# Patient Record
Sex: Male | Born: 1935 | Race: White | Hispanic: No | Marital: Married | Smoking: Former smoker
Health system: Southern US, Community
[De-identification: ages and names within clinical notes are randomized; demographics above are authoritative.]

## PROBLEM LIST (undated history)

## (undated) DIAGNOSIS — J449 Chronic obstructive pulmonary disease, unspecified: Secondary | ICD-10-CM

## (undated) DIAGNOSIS — I4891 Unspecified atrial fibrillation: Secondary | ICD-10-CM

## (undated) DIAGNOSIS — C801 Malignant (primary) neoplasm, unspecified: Secondary | ICD-10-CM

## (undated) DIAGNOSIS — K259 Gastric ulcer, unspecified as acute or chronic, without hemorrhage or perforation: Secondary | ICD-10-CM

## (undated) DIAGNOSIS — E785 Hyperlipidemia, unspecified: Secondary | ICD-10-CM

## (undated) HISTORY — PX: REPLACEMENT TOTAL KNEE: SUR1224

---

## 2004-04-20 ENCOUNTER — Inpatient Hospital Stay: Payer: Self-pay | Admitting: General Practice

## 2004-04-21 ENCOUNTER — Other Ambulatory Visit: Payer: Self-pay

## 2004-04-22 ENCOUNTER — Other Ambulatory Visit: Payer: Self-pay

## 2004-04-23 ENCOUNTER — Other Ambulatory Visit: Payer: Self-pay

## 2004-04-26 ENCOUNTER — Ambulatory Visit: Payer: Self-pay | Admitting: Family Medicine

## 2004-06-19 ENCOUNTER — Ambulatory Visit: Payer: Self-pay | Admitting: Family Medicine

## 2004-07-12 ENCOUNTER — Ambulatory Visit: Payer: Self-pay | Admitting: Family Medicine

## 2005-09-19 ENCOUNTER — Ambulatory Visit: Payer: Self-pay | Admitting: Urology

## 2005-10-09 ENCOUNTER — Ambulatory Visit: Payer: Self-pay | Admitting: Urology

## 2006-03-20 ENCOUNTER — Ambulatory Visit: Payer: Self-pay | Admitting: Urology

## 2007-05-14 ENCOUNTER — Ambulatory Visit: Payer: Self-pay | Admitting: Ophthalmology

## 2007-05-19 ENCOUNTER — Ambulatory Visit: Payer: Self-pay | Admitting: Ophthalmology

## 2007-07-21 ENCOUNTER — Other Ambulatory Visit: Payer: Self-pay

## 2007-07-21 ENCOUNTER — Ambulatory Visit: Payer: Self-pay | Admitting: Ophthalmology

## 2013-04-19 ENCOUNTER — Emergency Department: Payer: Self-pay

## 2013-04-19 LAB — TROPONIN I: Troponin-I: <0.02 ng/mL

## 2013-04-19 LAB — BASIC METABOLIC PANEL
ANION GAP: 9 (ref 7–16)
BUN: 24 mg/dL — ABNORMAL HIGH (ref 7–18)
CALCIUM: 8.7 mg/dL (ref 8.5–10.1)
CREATININE: 1.4 mg/dL — AB (ref 0.60–1.30)
Chloride: 106 mmol/L (ref 98–107)
Co2: 24 mmol/L (ref 21–32)
EGFR (African American): 54 — ABNORMAL LOW
GFR CALC NON AF AMER: 47 — AB
Glucose: 88 mg/dL (ref 65–99)
Osmolality: 281 (ref 275–301)
POTASSIUM: 4 mmol/L (ref 3.5–5.1)
Sodium: 139 mmol/L (ref 136–145)

## 2013-04-19 LAB — CBC
HCT: 46.8 % (ref 40.0–52.0)
HGB: 15.8 g/dL (ref 13.0–18.0)
MCH: 35.8 pg — ABNORMAL HIGH (ref 26.0–34.0)
MCHC: 33.8 g/dL (ref 32.0–36.0)
MCV: 106 fL — ABNORMAL HIGH (ref 80–100)
Platelet: 164 10*3/uL (ref 150–440)
RBC: 4.42 10*6/uL (ref 4.40–5.90)
RDW: 13.5 % (ref 11.5–14.5)
WBC: 9.6 10*3/uL (ref 3.8–10.6)

## 2013-04-19 LAB — HEPATIC FUNCTION PANEL A (ARMC)
Albumin: 3.6 g/dL (ref 3.4–5.0)
Alkaline Phosphatase: 66 U/L
BILIRUBIN DIRECT: 0.2 mg/dL (ref 0.00–0.20)
Bilirubin,Total: 0.9 mg/dL (ref 0.2–1.0)
SGOT(AST): 30 U/L (ref 15–37)
SGPT (ALT): 22 U/L (ref 12–78)
Total Protein: 7.2 g/dL (ref 6.4–8.2)

## 2013-04-19 LAB — LIPASE, BLOOD: LIPASE: 184 U/L (ref 73–393)

## 2013-04-19 LAB — PROTIME-INR
INR: 2.1
Prothrombin Time: 23.4 secs — ABNORMAL HIGH (ref 11.5–14.7)

## 2015-05-01 ENCOUNTER — Encounter: Payer: Self-pay | Admitting: *Deleted

## 2015-05-01 ENCOUNTER — Inpatient Hospital Stay
Admission: EM | Admit: 2015-05-01 | Discharge: 2015-05-06 | DRG: 872 | Disposition: A | Discharge status: Skilled Nursing Facility | Payer: Medicare Other | Attending: Internal Medicine | Admitting: Internal Medicine

## 2015-05-01 ENCOUNTER — Emergency Department: Payer: Medicare Other

## 2015-05-01 DIAGNOSIS — L03119 Cellulitis of unspecified part of limb: Secondary | ICD-10-CM

## 2015-05-01 DIAGNOSIS — Z79899 Other long term (current) drug therapy: Secondary | ICD-10-CM | POA: Diagnosis not present

## 2015-05-01 DIAGNOSIS — L02419 Cutaneous abscess of limb, unspecified: Secondary | ICD-10-CM | POA: Diagnosis present

## 2015-05-01 DIAGNOSIS — E872 Acidosis: Secondary | ICD-10-CM | POA: Diagnosis present

## 2015-05-01 DIAGNOSIS — C7951 Secondary malignant neoplasm of bone: Secondary | ICD-10-CM | POA: Diagnosis present

## 2015-05-01 DIAGNOSIS — Z96651 Presence of right artificial knee joint: Secondary | ICD-10-CM | POA: Diagnosis present

## 2015-05-01 DIAGNOSIS — I10 Essential (primary) hypertension: Secondary | ICD-10-CM | POA: Diagnosis present

## 2015-05-01 DIAGNOSIS — K567 Ileus, unspecified: Secondary | ICD-10-CM | POA: Diagnosis present

## 2015-05-01 DIAGNOSIS — I482 Chronic atrial fibrillation: Secondary | ICD-10-CM | POA: Diagnosis present

## 2015-05-01 DIAGNOSIS — K5900 Constipation, unspecified: Secondary | ICD-10-CM | POA: Diagnosis present

## 2015-05-01 DIAGNOSIS — R627 Adult failure to thrive: Secondary | ICD-10-CM | POA: Diagnosis present

## 2015-05-01 DIAGNOSIS — I4891 Unspecified atrial fibrillation: Secondary | ICD-10-CM

## 2015-05-01 DIAGNOSIS — Z87891 Personal history of nicotine dependence: Secondary | ICD-10-CM

## 2015-05-01 DIAGNOSIS — K760 Fatty (change of) liver, not elsewhere classified: Secondary | ICD-10-CM | POA: Diagnosis present

## 2015-05-01 DIAGNOSIS — Z6828 Body mass index (BMI) 28.0-28.9, adult: Secondary | ICD-10-CM | POA: Diagnosis not present

## 2015-05-01 DIAGNOSIS — K5981 Ogilvie syndrome: Secondary | ICD-10-CM

## 2015-05-01 DIAGNOSIS — D701 Agranulocytosis secondary to cancer chemotherapy: Secondary | ICD-10-CM | POA: Diagnosis present

## 2015-05-01 DIAGNOSIS — T451X5A Adverse effect of antineoplastic and immunosuppressive drugs, initial encounter: Secondary | ICD-10-CM | POA: Diagnosis present

## 2015-05-01 DIAGNOSIS — K598 Other specified functional intestinal disorders: Secondary | ICD-10-CM

## 2015-05-01 DIAGNOSIS — Z91040 Latex allergy status: Secondary | ICD-10-CM

## 2015-05-01 DIAGNOSIS — L03116 Cellulitis of left lower limb: Secondary | ICD-10-CM | POA: Diagnosis present

## 2015-05-01 DIAGNOSIS — R17 Unspecified jaundice: Secondary | ICD-10-CM

## 2015-05-01 DIAGNOSIS — C61 Malignant neoplasm of prostate: Secondary | ICD-10-CM | POA: Diagnosis present

## 2015-05-01 DIAGNOSIS — Z452 Encounter for adjustment and management of vascular access device: Secondary | ICD-10-CM

## 2015-05-01 DIAGNOSIS — Z7901 Long term (current) use of anticoagulants: Secondary | ICD-10-CM | POA: Diagnosis not present

## 2015-05-01 DIAGNOSIS — E785 Hyperlipidemia, unspecified: Secondary | ICD-10-CM | POA: Diagnosis present

## 2015-05-01 DIAGNOSIS — A419 Sepsis, unspecified organism: Principal | ICD-10-CM | POA: Diagnosis present

## 2015-05-01 DIAGNOSIS — D61818 Other pancytopenia: Secondary | ICD-10-CM | POA: Diagnosis present

## 2015-05-01 DIAGNOSIS — J449 Chronic obstructive pulmonary disease, unspecified: Secondary | ICD-10-CM | POA: Diagnosis present

## 2015-05-01 HISTORY — DX: Chronic obstructive pulmonary disease, unspecified: J44.9

## 2015-05-01 HISTORY — DX: Unspecified atrial fibrillation: I48.91

## 2015-05-01 HISTORY — DX: Hyperlipidemia, unspecified: E78.5

## 2015-05-01 HISTORY — DX: Malignant (primary) neoplasm, unspecified: C80.1

## 2015-05-01 LAB — MRSA PCR SCREENING: MRSA BY PCR: NEGATIVE

## 2015-05-01 LAB — LACTIC ACID, PLASMA
LACTIC ACID, VENOUS: 2.8 mmol/L — AB (ref 0.5–2.0)
LACTIC ACID, VENOUS: 2.4 mmol/L — AB (ref 0.5–2.0)
LACTIC ACID, VENOUS: 1.5 mmol/L (ref 0.5–2.0)

## 2015-05-01 LAB — COMPREHENSIVE METABOLIC PANEL
ALT: 28 U/L (ref 17–63)
ANION GAP: 10 (ref 5–15)
AST: 36 U/L (ref 15–41)
Albumin: 2.8 g/dL — ABNORMAL LOW (ref 3.5–5.0)
Alkaline Phosphatase: 122 U/L (ref 38–126)
BILIRUBIN TOTAL: 2 mg/dL — AB (ref 0.3–1.2)
BUN: 20 mg/dL (ref 6–20)
CALCIUM: 7.7 mg/dL — AB (ref 8.9–10.3)
CO2: 23 mmol/L (ref 22–32)
CREATININE: 1.12 mg/dL (ref 0.61–1.24)
Chloride: 101 mmol/L (ref 101–111)
GFR calc Af Amer: >60 mL/min (ref >60)
GFR calc non Af Amer: 59 mL/min — ABNORMAL LOW (ref >60)
GLUCOSE: 162 mg/dL — AB (ref 65–99)
Potassium: 4 mmol/L (ref 3.5–5.1)
Sodium: 134 mmol/L — ABNORMAL LOW (ref 135–145)
TOTAL PROTEIN: 6.3 g/dL — AB (ref 6.5–8.1)

## 2015-05-01 LAB — CBC WITH DIFFERENTIAL/PLATELET
BAND NEUTROPHILS: 10 %
BASOS ABS: 0 10*3/uL (ref 0–0.1)
BLASTS: 0 %
Basophils Relative: 0 %
EOS ABS: 0 10*3/uL (ref 0–0.7)
Eosinophils Relative: 1 %
HEMATOCRIT: 39.5 % — AB (ref 40.0–52.0)
Hemoglobin: 13.5 g/dL (ref 13.0–18.0)
LYMPHS PCT: 15 %
Lymphs Abs: 0.2 10*3/uL — ABNORMAL LOW (ref 1.0–3.6)
MCH: 37.1 pg — AB (ref 26.0–34.0)
MCHC: 34.1 g/dL (ref 32.0–36.0)
MCV: 108.8 fL — AB (ref 80.0–100.0)
Metamyelocytes Relative: 2 %
Monocytes Absolute: 0.3 10*3/uL (ref 0.2–1.0)
Monocytes Relative: 24 %
Myelocytes: 1 %
Neutro Abs: 0.8 10*3/uL — ABNORMAL LOW (ref 1.4–6.5)
Neutrophils Relative %: 47 %
OTHER: 0 %
PROMYELOCYTES ABS: 0 %
Platelets: 116 10*3/uL — ABNORMAL LOW (ref 150–440)
RBC: 3.63 MIL/uL — AB (ref 4.40–5.90)
RDW: 14 % (ref 11.5–14.5)
SMEAR REVIEW: ADEQUATE
WBC: 1.3 10*3/uL — AB (ref 3.8–10.6)
nRBC: 15 /100 WBC — ABNORMAL HIGH

## 2015-05-01 LAB — APTT: APTT: 49 s — AB (ref 24–36)

## 2015-05-01 LAB — URINALYSIS COMPLETE WITH MICROSCOPIC (ARMC ONLY)
BACTERIA UA: NONE SEEN
Bilirubin Urine: NEGATIVE
GLUCOSE, UA: 50 mg/dL — AB
Ketones, ur: NEGATIVE mg/dL
Leukocytes, UA: NEGATIVE
Nitrite: NEGATIVE
Protein, ur: 100 mg/dL — AB
Specific Gravity, Urine: 1.023 (ref 1.005–1.030)
pH: 6 (ref 5.0–8.0)

## 2015-05-01 LAB — MAGNESIUM: Magnesium: 1.8 mg/dL (ref 1.7–2.4)

## 2015-05-01 LAB — GLUCOSE, CAPILLARY: GLUCOSE-CAPILLARY: 116 mg/dL — AB (ref 65–99)

## 2015-05-01 LAB — RAPID INFLUENZA A&B ANTIGENS
Influenza A (ARMC): NOT DETECTED
Influenza B (ARMC): NOT DETECTED

## 2015-05-01 LAB — TROPONIN I: Troponin I: 0.03 ng/mL (ref <0.031)

## 2015-05-01 LAB — PROTIME-INR
INR: 1.62
PROTHROMBIN TIME: 19.3 s — AB (ref 11.4–15.0)

## 2015-05-01 MED ORDER — ONDANSETRON HCL 4 MG/2ML IJ SOLN
4.0000 mg | Freq: Four times a day (QID) | INTRAMUSCULAR | Status: DC | PRN
  Administered 2015-05-01: 4 mg via INTRAVENOUS
  Filled 2015-05-01: qty 2

## 2015-05-01 MED ORDER — HEPARIN (PORCINE) IN NACL 100-0.45 UNIT/ML-% IJ SOLN
1450.0000 [IU]/h | INTRAMUSCULAR | Status: DC
  Administered 2015-05-01: 1450 [IU]/h via INTRAVENOUS
  Filled 2015-05-01 (×6): qty 250

## 2015-05-01 MED ORDER — VANCOMYCIN HCL IN DEXTROSE 1-5 GM/200ML-% IV SOLN
1000.0000 mg | Freq: Two times a day (BID) | INTRAVENOUS | Status: DC
  Administered 2015-05-02 – 2015-05-03 (×4): 1000 mg via INTRAVENOUS
  Filled 2015-05-01 (×6): qty 200

## 2015-05-01 MED ORDER — PIPERACILLIN-TAZOBACTAM 3.375 G IVPB 30 MIN: 3.3750 g | Freq: Once | INTRAVENOUS | Status: DC

## 2015-05-01 MED ORDER — DILTIAZEM HCL ER COATED BEADS 180 MG PO CP24
360.0000 mg | ORAL_CAPSULE | ORAL | Status: DC
  Administered 2015-05-02 – 2015-05-06 (×5): 360 mg via ORAL
  Filled 2015-05-01 (×5): qty 2

## 2015-05-01 MED ORDER — DEXTROSE 5 % IV SOLN
2.0000 g | Freq: Once | INTRAVENOUS | Status: AC
  Administered 2015-05-01: 2 g via INTRAVENOUS
  Filled 2015-05-01: qty 2

## 2015-05-01 MED ORDER — DEXTROSE 5 % IV SOLN
1.0000 g | Freq: Once | INTRAVENOUS | Status: DC
  Filled 2015-05-01: qty 1

## 2015-05-01 MED ORDER — ALBUTEROL SULFATE (2.5 MG/3ML) 0.083% IN NEBU
2.5000 mg | INHALATION_SOLUTION | RESPIRATORY_TRACT | Status: DC | PRN
  Administered 2015-05-04 – 2015-05-05 (×3): 2.5 mg via RESPIRATORY_TRACT
  Filled 2015-05-01 (×4): qty 3

## 2015-05-01 MED ORDER — DILTIAZEM HCL 25 MG/5ML IV SOLN
15.0000 mg | Freq: Once | INTRAVENOUS | Status: AC
  Administered 2015-05-01: 15 mg via INTRAVENOUS
  Filled 2015-05-01: qty 5

## 2015-05-01 MED ORDER — WARFARIN SODIUM 1 MG PO TABS
4.0000 mg | ORAL_TABLET | Freq: Once | ORAL | Status: AC
  Administered 2015-05-01: 4 mg via ORAL
  Filled 2015-05-01: qty 1
  Filled 2015-05-01: qty 4

## 2015-05-01 MED ORDER — VANCOMYCIN HCL IN DEXTROSE 1-5 GM/200ML-% IV SOLN
1000.0000 mg | Freq: Once | INTRAVENOUS | Status: AC
  Administered 2015-05-01: 1000 mg via INTRAVENOUS
  Filled 2015-05-01: qty 200

## 2015-05-01 MED ORDER — ACETAMINOPHEN 650 MG RE SUPP: 650.0000 mg | Freq: Four times a day (QID) | RECTAL | Status: DC | PRN

## 2015-05-01 MED ORDER — HYDROCODONE-ACETAMINOPHEN 5-325 MG PO TABS
1.0000 | ORAL_TABLET | ORAL | Status: DC | PRN
  Administered 2015-05-01 – 2015-05-04 (×4): 1 via ORAL
  Filled 2015-05-01 (×5): qty 1

## 2015-05-01 MED ORDER — SODIUM CHLORIDE 0.9 % IV BOLUS (SEPSIS)
2940.0000 mL | Freq: Once | INTRAVENOUS | Status: AC
  Administered 2015-05-01: 1000 mL via INTRAVENOUS

## 2015-05-01 MED ORDER — PIPERACILLIN-TAZOBACTAM 3.375 G IVPB
3.3750 g | Freq: Three times a day (TID) | INTRAVENOUS | Status: DC
  Administered 2015-05-01 – 2015-05-05 (×12): 3.375 g via INTRAVENOUS
  Filled 2015-05-01 (×14): qty 50

## 2015-05-01 MED ORDER — DEXAMETHASONE 4 MG PO TABS: 8.0000 mg | ORAL_TABLET | ORAL | Status: DC

## 2015-05-01 MED ORDER — DILTIAZEM HCL 100 MG IV SOLR
5.0000 mg/h | INTRAVENOUS | Status: DC
  Administered 2015-05-01: 5 mg/h via INTRAVENOUS
  Administered 2015-05-02: 7.5 mg/h via INTRAVENOUS
  Filled 2015-05-01 (×2): qty 100

## 2015-05-01 MED ORDER — ACETAMINOPHEN 325 MG PO TABS: 650.0000 mg | ORAL_TABLET | Freq: Four times a day (QID) | ORAL | Status: DC | PRN

## 2015-05-01 MED ORDER — ONDANSETRON HCL 4 MG PO TABS
4.0000 mg | ORAL_TABLET | Freq: Four times a day (QID) | ORAL | Status: DC | PRN
Start: 2015-05-01 — End: 2015-05-06
  Administered 2015-05-03: 4 mg via ORAL
  Filled 2015-05-01: qty 1

## 2015-05-01 MED ORDER — SODIUM CHLORIDE 0.9 % IV SOLN
INTRAVENOUS | Status: DC
  Administered 2015-05-01 – 2015-05-06 (×6): via INTRAVENOUS

## 2015-05-01 MED ORDER — WARFARIN - PHARMACIST DOSING INPATIENT
Freq: Every day | Status: DC
  Administered 2015-05-05: 18:00:00
  Filled 2015-05-01 (×3): qty 1

## 2015-05-01 MED ORDER — DILTIAZEM HCL 25 MG/5ML IV SOLN
10.0000 mg | Freq: Once | INTRAVENOUS | Status: AC
  Administered 2015-05-01: 10 mg via INTRAVENOUS

## 2015-05-01 MED ORDER — PREDNISONE 10 MG PO TABS
10.0000 mg | ORAL_TABLET | Freq: Every day | ORAL | Status: DC
  Administered 2015-05-02 – 2015-05-06 (×5): 10 mg via ORAL
  Filled 2015-05-01 (×5): qty 1

## 2015-05-01 MED ORDER — ATORVASTATIN CALCIUM 20 MG PO TABS
80.0000 mg | ORAL_TABLET | Freq: Every evening | ORAL | Status: DC
  Administered 2015-05-01 – 2015-05-05 (×4): 80 mg via ORAL
  Filled 2015-05-01 (×4): qty 4

## 2015-05-01 MED ORDER — METOPROLOL SUCCINATE ER 100 MG PO TB24
100.0000 mg | ORAL_TABLET | ORAL | Status: DC
  Administered 2015-05-02 – 2015-05-06 (×5): 100 mg via ORAL
  Filled 2015-05-01: qty 2
  Filled 2015-05-01: qty 1
  Filled 2015-05-01 (×2): qty 2
  Filled 2015-05-01: qty 1

## 2015-05-01 MED ORDER — SODIUM CHLORIDE 0.9 % IV BOLUS (SEPSIS): 500.0000 mL | Freq: Once | INTRAVENOUS | Status: DC

## 2015-05-01 MED ORDER — CETYLPYRIDINIUM CHLORIDE 0.05 % MT LIQD
7.0000 mL | Freq: Two times a day (BID) | OROMUCOSAL | Status: DC
  Administered 2015-05-02 – 2015-05-06 (×9): 7 mL via OROMUCOSAL

## 2015-05-01 NOTE — H&P (Addendum)
[place] [facility] [place] NAME: Chad Kirby    MR#:  [id number]  DATE OF BIRTH:  1930-01-25  DATE OF ADMISSION:  05/01/2015  PRIMARY CARE PHYSICIAN: No primary care provider on file.   REQUESTING/REFERRING PHYSICIAN: Joanne Gavel, MD  CHIEF COMPLAINT:   Chief Complaint  Patient presents with  . Weakness   weakness and left leg infection 2-3 days  HISTORY OF PRESENT ILLNESS:  Chad Kirby  is a 82 y.o. male with a known history of this metastasis, chronic A. fib and COPD. The patient at present in the ED revealed a generalized weakness and the left leg infection for the past 2-3 days. Patient has a history of processes cancer with metastasis and got chemotherapy in [place] last Friday. He got left leg injury 1 week ago and noticed left leg infection 3 days ago, which has been spreading and worsening. He also complains of fever and chills, nausea but no vomiting or diarrhea. He was found have A. fib with RVR at 160s in the ED. He was treated with Cardizem IV 1 dose, heart rate decreased to about 130 to 140s. So he  is a started on Cardizem drip and given vancomycin for left leg cellulitis.  PAST MEDICAL HISTORY:   Past Medical History  Diagnosis Date  . COPD (chronic obstructive pulmonary disease) ([place])   . Cancer [facility])     Prostate with bone mets-current chemo treatment  . Hyperlipidemia   . Atrial fibrillation ([place])     PAST SURGICAL HISTORY:   Past Surgical History  Procedure Laterality Date  . Replacement total knee Right     SOCIAL HISTORY:   Social History  Substance Use Topics  . Smoking status: Former Research scientist (life sciences)  . Smokeless tobacco: Not on file  . Alcohol Use: Yes     Comment: occasional    FAMILY HISTORY:  No family history on file.  DRUG ALLERGIES:   Allergies  Allergen Reactions  . Latex Rash    REVIEW OF SYSTEMS:  CONSTITUTIONAL: Has fever, chills and generalized weakness.  EYES: No blurred or double  vision.  EARS, NOSE, AND THROAT: No tinnitus or ear pain.  RESPIRATORY: No cough, shortness of breath, wheezing or hemoptysis.  CARDIOVASCULAR: No chest pain, orthopnea, edema.  GASTROINTESTINAL: Has nausea, no vomiting, diarrhea or abdominal pain.  GENITOURINARY: No dysuria, hematuria.  ENDOCRINE: No polyuria, nocturia,  HEMATOLOGY: No anemia, easy bruising or bleeding SKIN: No rash or lesion. MUSCULOSKELETAL: No joint pain or arthritis. But has left leg injury and infection.  NEUROLOGIC: No tingling, numbness, weakness.  PSYCHIATRY: No anxiety or depression.   MEDICATIONS AT HOME:   Prior to Admission medications   Not on File      VITAL SIGNS:  Blood pressure 116/72, pulse 129, temperature 97.6 F (36.4 C), temperature source Oral, resp. rate 22, height 6' (1.829 m), weight 97.523 kg (215 lb), SpO2 98 %.  PHYSICAL EXAMINATION:  GENERAL:  82 y.o.-year-old patient lying in the bed with no acute distress.  EYES: Pupils equal, round, reactive to light and accommodation. No scleral icterus. Extraocular muscles intact.  HEENT: Head atraumatic, normocephalic. Oropharynx and nasopharynx clear.  NECK:  Supple, no jugular venous distention. No thyroid enlargement, no tenderness.  LUNGS: Normal breath sounds bilaterally, no wheezing, rales,rhonchi or crepitation. No use of accessory muscles of respiration.  CARDIOVASCULAR: S1, S2 normal. No murmurs, rubs, or gallops.  ABDOMEN: Soft, nontender, nondistended. Bowel sounds present. No organomegaly or mass.  EXTREMITIES: No pedal edema, cyanosis, or clubbing. Tenderness, erythema and swelling around the left leg up all ankle and underneath knee. NEUROLOGIC: Cranial nerves II through XII are intact. Muscle strength 4/5 in all extremities. Sensation intact. Gait not checked.  PSYCHIATRIC: The patient is alert and oriented x 3.  SKIN: No obvious rash, lesion, or ulcer.   LABORATORY PANEL:   CBC  Recent Labs Lab 05/01/15 1633  WBC 1.3*   HGB 13.5  HCT 39.5*  PLT 116*   ------------------------------------------------------------------------------------------------------------------  Chemistries   Recent Labs Lab 05/01/15 1633  NA 134*  K 4.0  CL 101  CO2 23  GLUCOSE 162*  BUN 20  CREATININE 1.12  CALCIUM 7.7*  AST 36  ALT 28  ALKPHOS 122  BILITOT 2.0*   ------------------------------------------------------------------------------------------------------------------  Cardiac Enzymes  Recent Labs Lab 05/01/15 1633  TROPONINI 0.03   ------------------------------------------------------------------------------------------------------------------  RADIOLOGY:  Dg Chest Port 1 View  05/01/2015  CLINICAL DATA:  Weakness following chemotherapy EXAM: PORTABLE CHEST 1 VIEW COMPARISON:  04/19/2013 FINDINGS: Cardiac shadow is stable. Elevation of left hemidiaphragm is again seen. No focal infiltrate or sizable effusion is noted. Nodular density is noted overlying the left lung base. No other focal abnormality is seen. IMPRESSION: Nodular density in the left lung base. This may be related to the patient's known clinical history. Nonemergent CT of the chest is recommended to assess. No acute infiltrate is seen. Electronically Signed   By: Inez Catalina M.D.   On: 05/01/2015 17:13    EKG:   Orders placed or performed during the hospital encounter of 05/01/15  . ED EKG  . ED EKG  . EKG 12-Lead  . EKG 12-Lead    IMPRESSION AND PLAN:   A. fib with RVR The patient will be admitted to stepdown, continue Cardizem drip, start heparin drip due to subtherapeutic INR and Coumadin pharmacy to dose. Transition to by mouth Cardizem and Lopressor if heart rate is controlled. Follow-up cardiology consult.  Sepsis with left leg cellulitis.  The patient was treated with vancomycin in the ED. I will continue vancomycin and start Zosyn pharmacy to dose. IV fluid support. Follow-up CBC and blood culture.  Neutropenia. Due to  chemotherapy. Follow-up CBC and oncology consult. Thrombocytopenia. Follow-up CBC. History of processes cancer. Follow-up oncology consult.  Lactic acidosis. Continue IV fluid support and antibiotics follow-up level.  Elevated bilirubin. Follow-up CMP and abdominal ultrasound.  All the records are reviewed and case discussed with ED provider. Management plans discussed with the patient, his wife and daughter and they are in agreement.  CODE STATUS: Full code  TOTAL CRITICAL TIME TAKING CARE OF THIS PATIENT: 62 minutes.    Demetrios Loll M.D on 05/01/2015 at 6:44 PM  Between 7am to 6pm - Pager - [phone]  After 6pm go to [email or web address] - password [email or web address]  [facility]  Office  [phone]  CC: Primary care physician; No primary care provider on file.

## 2015-05-01 NOTE — Consult Note (Signed)
ANTICOAGULATION CONSULT NOTE - Initial Consult  Pharmacy Consult for warfarin Indication: atrial fibrillation  Allergies  Allergen Reactions  . Latex Rash    Patient Measurements: Height: 6' (182.9 cm) Weight: 215 lb (97.523 kg) IBW/kg (Calculated) : 77.6 Heparin Dosing Weight:   Vital Signs: Temp: 97.6 F (36.4 C) (02/26 1603) Temp Source: Oral (02/26 1603) BP: 134/79 mmHg (02/26 1929) Pulse Rate: 140 (02/26 1929)  Labs:  Recent Labs  05/01/15 1633  HGB 13.5  HCT 39.5*  PLT 116*  APTT 49*  LABPROT 19.3*  INR 1.62  CREATININE 1.12  TROPONINI 0.03    Estimated Creatinine Clearance: 60.5 mL/min (by C-G formula based on Cr of 1.12).   Medical History: Past Medical History  Diagnosis Date  . COPD (chronic obstructive pulmonary disease) ([place])   . Cancer [facility])     Prostate with bone mets-current chemo treatment  . Hyperlipidemia   . Atrial fibrillation ([facility])     Medications:  Scheduled:  . warfarin  4 mg Oral ONCE-1800  . [START ON 05/02/2015] Warfarin - Pharmacist Dosing Inpatient   Does not apply q1800    Assessment: Pt is a 82 year old male with a hx of afbi, on warfarin 3mg  daily at home. Pt found to be in afib with RVR. INR is subtherapeutic on admission at 1.62  Goal of Therapy:  INR 2-3 Monitor platelets by anticoagulation protocol: Yes   Plan:  INR on admission is subtherapeutic. Will give 4mg  tonight and recheck INR in the AM. Check INR daily while on antibiotics. Pharmacy also consulted to dose a heparin drip while pt INR is subtherapeutic. Will need to continue unitl INR is therapeutic for atleast 24 hours.  Carollee Massed Maccia 05/01/2015,7:34 PM

## 2015-05-01 NOTE — Consult Note (Signed)
Pharmacy Antibiotic Note  Chad Kirby is a 82 y.o. male admitted on 05/01/2015 with sepsis and wound infection/cellulitis.  Pharmacy has been consulted for vancomycin and zosyn dosing.  Plan: Vancomycin 1000 IV every 12 hours.  Goal trough 15-20 mcg/mL. Zosyn 3.375g IV q8h (4 hour infusion). pt recieved 1g of vancomycin in the ED. Will start 6 hours after inital dose  Will draw trough prior to the 4th dose 02/28 @ 1100  Height: 6' (182.9 cm) Weight: 215 lb (97.523 kg) IBW/kg (Calculated) : 77.6  Temp (24hrs), Avg:97.6 F (36.4 C), Min:97.6 F (36.4 C), Max:97.6 F (36.4 C)   Recent Labs Lab 05/01/15 1633 05/01/15 1634  WBC 1.3*  --   CREATININE 1.12  --   LATICACIDVEN  --  2.8*    Estimated Creatinine Clearance: 60.5 mL/min (by C-G formula based on Cr of 1.12).    Allergies  Allergen Reactions  . Latex Rash    Antimicrobials this admission: cefepime 2/26 >> 2/26 vancomycin 2/26 >>  Zosyn 2/26>>  Dose adjustments this admission:   Microbiology results: 2/26 BCx: sent 2/26 UCx: sent   Flu-neg  Thank you for allowing pharmacy to be a part of this patient's care.  Ramond Dial 05/01/2015 7:31 PM

## 2015-05-01 NOTE — Consult Note (Signed)
ANTICOAGULATION CONSULT NOTE - Initial Consult  Pharmacy Consult for heparin drip Indication: atrial fibrillation  Allergies  Allergen Reactions  . Latex Rash    Patient Measurements: Height: 6' (182.9 cm) Weight: 215 lb (97.523 kg) IBW/kg (Calculated) : 77.6 Heparin Dosing Weight: 97.2kg  Vital Signs: Temp: 97.6 F (36.4 C) (02/26 1603) Temp Source: Oral (02/26 1603) BP: 134/79 mmHg (02/26 1929) Pulse Rate: 140 (02/26 1929)  Labs:  Recent Labs  05/01/15 1633  HGB 13.5  HCT 39.5*  PLT 116*  APTT 49*  LABPROT 19.3*  INR 1.62  CREATININE 1.12  TROPONINI 0.03    Estimated Creatinine Clearance: 60.5 mL/min (by C-G formula based on Cr of 1.12).   Medical History: Past Medical History  Diagnosis Date  . COPD (chronic obstructive pulmonary disease) ([place])   . Cancer [facility])     Prostate with bone mets-current chemo treatment  . Hyperlipidemia   . Atrial fibrillation ([facility])     Medications:  Scheduled:  . warfarin  4 mg Oral ONCE-1800  . [START ON 05/02/2015] Warfarin - Pharmacist Dosing Inpatient   Does not apply q1800    Assessment: Pt is a 82 year old male with a PMH of chronic afib who presents in Afib RVR. INR on admission is subtherapeutic 1.6. Pharmacy consulted to dose heparin drip since INR is subtherapeutic.  Goal of Therapy:  Heparin level 0.3-0.7 units/ml Monitor platelets by anticoagulation protocol: Yes   Plan: Will not bolus since INR is elevated.  Start heparin infusion at 1450 units/hr Check anti-Xa level in 8 hours and daily while on heparin Continue to monitor H&H and platelets  Wynter Grave D Darcell Yacoub 05/01/2015,7:37 PM

## 2015-05-01 NOTE — ED Notes (Signed)
Called [facility] Henrene Pastor to initiate code sepsis  [phone]

## 2015-05-01 NOTE — ED Provider Notes (Signed)
[facility] Emergency Department Provider Note  ____________________________________________  Time seen: Approximately 4:06 PM  I have reviewed the triage vital signs and the nursing notes.   HISTORY  Chief Complaint Weakness    HPI Chad Kirby is a 82 y.o. male with history of atrial fibrillation on Coumadin, hyperlipidemia, prostate cancer with metastasis to multiple sites including spine, hips, ribs and possibly lung, on chemotherapy who presents for evaluation of generalized weakness/fatigue for 3 days, gradual onset, constant since onset, currently severe, no modifying factors. The patient had his first chemotherapy treatment on 04/22/2015. At that time he has been mildly fatigued however this has worsened over the past 3 days. He has had nasal congestion and there is some redness associated with a wound on his left leg that began as a skin tear. He denies any chest pain or difficulty breathing. No nausea, vomiting, diarrhea. No fevers or chills.   Past Medical History  Diagnosis Date  . COPD (chronic obstructive pulmonary disease) ([place])   . Cancer [facility])     Prostate with bone mets-current chemo treatment  . Hyperlipidemia   . Atrial fibrillation [facility])     Patient Active Problem List   Diagnosis Date Noted  . Atrial fibrillation with RVR ([place]) 05/01/2015    Past Surgical History  Procedure Laterality Date  . Replacement total knee Right     No current outpatient prescriptions on file.  Allergies Latex  No family history on file.  Social History Social History  Substance Use Topics  . Smoking status: Former Research scientist (life sciences)  . Smokeless tobacco: None  . Alcohol Use: Yes     Comment: occasional    Review of Systems Constitutional: No fever/chills Eyes: No visual changes. ENT: No sore throat. Cardiovascular: Denies chest pain. Respiratory: Denies shortness of breath. Gastrointestinal: No abdominal pain.  No nausea, no vomiting.  No  diarrhea.  No constipation. Genitourinary: Negative for dysuria. Musculoskeletal: Negative for back pain. Skin: Negative for rash. Neurological: Negative for headaches, focal weakness or numbness.  10-point ROS otherwise negative.  ____________________________________________   PHYSICAL EXAM:  Filed Vitals:   05/01/15 1714 05/01/15 1716 05/01/15 1717 05/01/15 1748  BP: 90/59 100/82 100/58 116/72  Pulse: 150 157 151 129  Temp:      TempSrc:      Resp: 33 32 38 22  Height:      Weight:      SpO2: 95% 97% 94% 98%     Constitutional: Alert and oriented. Ill appearing.  Eyes: Conjunctivae are normal. PERRL. EOMI. Head: Atraumatic. Nose: + congestion/rhinnorhea. Mouth/Throat: Mucous membranes are moist.  Oropharynx non-erythematous. Neck: No stridor.  appears supple without meningismus. Cardiovascular: tachycardic rate, irregular rhythm. Grossly normal heart sounds.  Good peripheral circulation. Respiratory: Tachypnea. Normal respiratory effort.  No retractions. Lungs CTAB. Gastrointestinal: Soft and nontender. No distention. No CVA tenderness. Genitourinary: deferred. Musculoskeletal: No joint effusions. There is a superficial wound on the lateral aspect of the left calf with surrounding erythema/warmth and induration, no fluctuance.  Neurologic:  Normal speech and language. No gross focal neurologic deficits are appreciated.  Skin:  Skin is warm, dry and intact. No rash noted. Psychiatric: Mood and affect are normal. Speech and behavior are normal.  ____________________________________________   LABS (all labs ordered are listed, but only abnormal results are displayed)  Labs Reviewed  CBC WITH DIFFERENTIAL/PLATELET - Abnormal; Notable for the following:    WBC 1.3 (*)    RBC 3.63 (*)    HCT 39.5 (*)  MCV 108.8 (*)    MCH 37.1 (*)    Platelets 116 (*)    All other components within normal limits  COMPREHENSIVE METABOLIC PANEL - Abnormal; Notable for the following:     Sodium 134 (*)    Glucose, Bld 162 (*)    Calcium 7.7 (*)    Total Protein 6.3 (*)    Albumin 2.8 (*)    Total Bilirubin 2.0 (*)    GFR calc non Af Amer 59 (*)    All other components within normal limits  URINALYSIS COMPLETEWITH MICROSCOPIC ([facility] ONLY) - Abnormal; Notable for the following:    Color, Urine AMBER (*)    APPearance CLEAR (*)    Glucose, UA 50 (*)    Hgb urine dipstick 1+ (*)    Protein, ur 100 (*)    Squamous Epithelial / LPF 0-5 (*)    All other components within normal limits  PROTIME-INR - Abnormal; Notable for the following:    Prothrombin Time 19.3 (*)    All other components within normal limits  APTT - Abnormal; Notable for the following:    aPTT 49 (*)    All other components within normal limits  LACTIC ACID, PLASMA - Abnormal; Notable for the following:    Lactic Acid, Venous 2.8 (*)    All other components within normal limits  RAPID INFLUENZA A&B ANTIGENS ([facility] ONLY)  CULTURE, BLOOD (ROUTINE X 2)  CULTURE, BLOOD (ROUTINE X 2)  URINE CULTURE  RAPID INFLUENZA A&B ANTIGENS ([facility] ONLY)  TROPONIN I  LACTIC ACID, PLASMA   ____________________________________________  EKG  ED ECG REPORT I, Joanne Gavel, the attending physician, personally viewed and interpreted this ECG.   Date: 05/01/2015  EKG Time: 16:23  Rate: 148  Rhythm: atrial fibrillation with rapid ventricular rate, rate 148  Axis:   Intervals:none  ST&T Change: No acute ST elevation. ST depression is noted in lead V3 and V4, V5.  ____________________________________________  RADIOLOGY  Chest x-ray IMPRESSION: Nodular density in the left lung base. This may be related to the patient's known clinical history. Nonemergent CT of the chest is recommended to assess.  No acute infiltrate is seen. ____________________________________________   PROCEDURES  Procedure(s) performed: None  Critical Care performed: Yes, see critical care note(s). Total critical care time spent  40 minutes.  ____________________________________________   INITIAL IMPRESSION / ASSESSMENT AND PLAN / ED COURSE  Pertinent labs & imaging results that were available during my care of the patient were reviewed by me and considered in my medical decision making (see chart for details).  Chad Kirby is a 82 y.o. male with history of atrial fibrillation on Coumadin, hyperlipidemia, prostate cancer with metastasis to multiple sites including spine, hips, ribs and possibly lung, on chemotherapy who presents for evaluation of generalized weakness/fatigue for 3 days. On exam, he appears ill. Afebrile but he is tachycardic with a heart rate ranging from 130s to 150s, atrial fibrillation with rapid ventricular rate. We'll give diltiazem. Additionally he is tachypneic and is meeting 2 out of 4 Sirs criteria. He does have a wound on his left leg consistent with cellulitis without abscess however given immunocompromise, we will evaluate for additional sources of infection. IV vancomycin and cefepime ordered. Will give liberal IV fluids. Awaiting labs, chest x-ray, urinalysis anticipate admission.  ----------------------------------------- 5:28 PM on 05/01/2015 ----------------------------------------- Patient with some improvement of his heart rate from 160 bpm to 130 bpm. We'll give an additional 10 mg of Cardizem and start drip. Blood  pressure currently 110/80.  ----------------------------------------- 6:04 PM on 05/01/2015 ----------------------------------------- Eyes reviewed and are notable for leukopenia, differential is pending. Lactic acid elevated at 2.8. CMP with hypocalcemia. Negative troponin. Negative flu. Urinalysis is not consistent with infection. Chest x-ray is no acute infiltrate. Case discussed with hospitalist, Dr. Geryl Councilman, for admission at this time.  ____________________________________________   FINAL CLINICAL IMPRESSION(S) / ED DIAGNOSES  Final diagnoses:  Sepsis, due to  unspecified organism ([place])  Cellulitis of left lower extremity  Atrial fibrillation with rapid ventricular response ([place])      Joanne Gavel, MD 05/01/15 [phone]

## 2015-05-01 NOTE — ED Notes (Signed)
Per EMS report, patient c/o weakness since 1st chemo treatment two weeks ago. Weakness has worsened over the last two days. Patient is being treated for prostate cancer with bone mets. Patient states he has been too weak to ambulate today.

## 2015-05-02 ENCOUNTER — Inpatient Hospital Stay: Payer: Medicare Other

## 2015-05-02 DIAGNOSIS — L03116 Cellulitis of left lower limb: Secondary | ICD-10-CM

## 2015-05-02 DIAGNOSIS — I4891 Unspecified atrial fibrillation: Secondary | ICD-10-CM

## 2015-05-02 LAB — CBC
HCT: 35.5 % — ABNORMAL LOW (ref 40.0–52.0)
Hemoglobin: 12.1 g/dL — ABNORMAL LOW (ref 13.0–18.0)
MCH: 37.4 pg — AB (ref 26.0–34.0)
MCHC: 34.1 g/dL (ref 32.0–36.0)
MCV: 109.9 fL — AB (ref 80.0–100.0)
PLATELETS: 96 10*3/uL — AB (ref 150–440)
RBC: 3.23 MIL/uL — ABNORMAL LOW (ref 4.40–5.90)
RDW: 14.1 % (ref 11.5–14.5)
WBC: 5 10*3/uL (ref 3.8–10.6)

## 2015-05-02 LAB — BLOOD CULTURE ID PANEL (REFLEXED)
Acinetobacter baumannii: NOT DETECTED
CANDIDA ALBICANS: NOT DETECTED
CANDIDA PARAPSILOSIS: NOT DETECTED
CANDIDA TROPICALIS: NOT DETECTED
CARBAPENEM RESISTANCE: NOT DETECTED
Candida glabrata: NOT DETECTED
Candida krusei: NOT DETECTED
Enterobacter cloacae complex: NOT DETECTED
Enterobacteriaceae species: NOT DETECTED
Enterococcus species: NOT DETECTED
Escherichia coli: NOT DETECTED
Haemophilus influenzae: NOT DETECTED
KLEBSIELLA PNEUMONIAE: NOT DETECTED
Klebsiella oxytoca: NOT DETECTED
Listeria monocytogenes: NOT DETECTED
Methicillin resistance: NOT DETECTED
Neisseria meningitidis: NOT DETECTED
PROTEUS SPECIES: NOT DETECTED
Pseudomonas aeruginosa: NOT DETECTED
SERRATIA MARCESCENS: NOT DETECTED
STAPHYLOCOCCUS AUREUS BCID: NOT DETECTED
Staphylococcus species: NOT DETECTED
Streptococcus agalactiae: NOT DETECTED
Streptococcus pneumoniae: NOT DETECTED
Streptococcus pyogenes: NOT DETECTED
Streptococcus species: DETECTED — AB
VANCOMYCIN RESISTANCE: NOT DETECTED

## 2015-05-02 LAB — COMPREHENSIVE METABOLIC PANEL
ALT: 23 U/L (ref 17–63)
ANION GAP: 8 (ref 5–15)
AST: 23 U/L (ref 15–41)
Albumin: 2.2 g/dL — ABNORMAL LOW (ref 3.5–5.0)
Alkaline Phosphatase: 77 U/L (ref 38–126)
BUN: 18 mg/dL (ref 6–20)
CHLORIDE: 106 mmol/L (ref 101–111)
CO2: 21 mmol/L — ABNORMAL LOW (ref 22–32)
CREATININE: 0.87 mg/dL (ref 0.61–1.24)
Calcium: 6.7 mg/dL — ABNORMAL LOW (ref 8.9–10.3)
Glucose, Bld: 120 mg/dL — ABNORMAL HIGH (ref 65–99)
POTASSIUM: 3.7 mmol/L (ref 3.5–5.1)
SODIUM: 135 mmol/L (ref 135–145)
Total Bilirubin: 2.9 mg/dL — ABNORMAL HIGH (ref 0.3–1.2)
Total Protein: 5.3 g/dL — ABNORMAL LOW (ref 6.5–8.1)

## 2015-05-02 LAB — LACTIC ACID, PLASMA: LACTIC ACID, VENOUS: 1.1 mmol/L (ref 0.5–2.0)

## 2015-05-02 LAB — HEPARIN LEVEL (UNFRACTIONATED)
HEPARIN UNFRACTIONATED: 0.57 [IU]/mL (ref 0.30–0.70)
HEPARIN UNFRACTIONATED: 0.67 [IU]/mL (ref 0.30–0.70)

## 2015-05-02 LAB — PROTIME-INR
INR: 1.96
PROTHROMBIN TIME: 22.2 s — AB (ref 11.4–15.0)

## 2015-05-02 LAB — TSH: TSH: 1.162 u[IU]/mL (ref 0.350–4.500)

## 2015-05-02 MED ORDER — SILVER SULFADIAZINE 1 % EX CREA
TOPICAL_CREAM | Freq: Every day | CUTANEOUS | Status: DC
  Administered 2015-05-02 – 2015-05-06 (×5): via TOPICAL
  Filled 2015-05-02: qty 25
  Filled 2015-05-02: qty 85

## 2015-05-02 MED ORDER — AMIODARONE HCL IN DEXTROSE 360-4.14 MG/200ML-% IV SOLN
30.0000 mg/h | INTRAVENOUS | Status: DC
  Administered 2015-05-03: 30 mg/h via INTRAVENOUS
  Filled 2015-05-02 (×12): qty 200

## 2015-05-02 MED ORDER — WARFARIN SODIUM 1 MG PO TABS
4.0000 mg | ORAL_TABLET | Freq: Every day | ORAL | Status: DC
  Administered 2015-05-02: 4 mg via ORAL
  Filled 2015-05-02: qty 4

## 2015-05-02 MED ORDER — AMIODARONE HCL IN DEXTROSE 360-4.14 MG/200ML-% IV SOLN
60.0000 mg/h | INTRAVENOUS | Status: AC
  Administered 2015-05-02: 60 mg/h via INTRAVENOUS
  Filled 2015-05-02 (×3): qty 200

## 2015-05-02 MED ORDER — NOREPINEPHRINE BITARTRATE 1 MG/ML IV SOLN
0.0000 ug/min | INTRAVENOUS | Status: DC
  Filled 2015-05-02: qty 4

## 2015-05-02 NOTE — Progress Notes (Signed)
After re-assessing pts home meds, I discovered pt takes oral metoprolol and cardizem daily at home. Talked with Dr. Marcille Blanco, he gave the okay to administer both medications in addition to keeping the pt on continuous Cardizem drip.

## 2015-05-02 NOTE — Progress Notes (Signed)
Chad Kirby at [facility] NAME: Chad Kirby    MR#:  [id number]  DATE OF BIRTH:  09/11/1929  SUBJECTIVE:  CHIEF COMPLAINT:  Patient is resting comfortably. Intermittent episodes of palpitations. Last chemotherapy was 2 weeks ago at [facility]. Denies any chest pain. Daughter at bedside  REVIEW OF SYSTEMS:  CONSTITUTIONAL: No fever, reporting fatigue or weakness.  EYES: No blurred or double vision.  EARS, NOSE, AND THROAT: No tinnitus or ear pain.  RESPIRATORY: No cough, shortness of breath, wheezing or hemoptysis.  CARDIOVASCULAR: Intermittent palpitations. No chest pain, orthopnea, edema.  GASTROINTESTINAL: No nausea, vomiting, diarrhea or abdominal pain.  GENITOURINARY: No dysuria, hematuria.  ENDOCRINE: No polyuria, nocturia,  HEMATOLOGY: No anemia, easy bruising or bleeding SKIN: No rash or lesion. Left leg wound getting worse but better after starting antibiotics  MUSCULOSKELETAL: No joint pain or arthritis.   NEUROLOGIC: No tingling, numbness, weakness.  PSYCHIATRY: No anxiety or depression.   DRUG ALLERGIES:   Allergies  Allergen Reactions  . Latex Rash    VITALS:  Blood pressure 94/67, pulse 120, temperature 98 F (36.7 C), temperature source Oral, resp. rate 20, height 6\' 1"  (1.854 m), weight 98.5 kg (217 lb 2.5 oz), SpO2 96 %.  PHYSICAL EXAMINATION:  GENERAL:  82 y.o.-year-old patient lying in the bed with no acute distress.  EYES: Pupils equal, round, reactive to light and accommodation. No scleral icterus. Extraocular muscles intact.  HEENT: Head atraumatic, normocephalic. Oropharynx and nasopharynx clear.  NECK:  Supple, no jugular venous distention. No thyroid enlargement, no tenderness.  LUNGS: Normal breath sounds bilaterally, no wheezing, rales,rhonchi or crepitation. No use of accessory muscles of respiration.  CARDIOVASCULAR: Irregularly irregular. No murmurs, rubs, or gallops.  ABDOMEN: Soft, nontender,  nondistended. Bowel sounds present. No organomegaly or mass.  EXTREMITIES: Left lower extremity-wound with purulent discharge, tender with some edema, erythema is better after starting IV antibiotics. No pedal edema, cyanosis, or clubbing.  NEUROLOGIC: Cranial nerves II through XII are intact. Muscle strength 5/5 in all extremities. Sensation intact. Gait not checked.  PSYCHIATRIC: The patient is alert and oriented x 3.  SKIN: No obvious rash, lesion, or ulcer.    LABORATORY PANEL:   CBC  Recent Labs Lab 05/02/15 0614  WBC 5.0  HGB 12.1*  HCT 35.5*  PLT 96*   ------------------------------------------------------------------------------------------------------------------  Chemistries   Recent Labs Lab 05/01/15 1633 05/02/15 0614  NA 134* 135  K 4.0 3.7  CL 101 106  CO2 23 21*  GLUCOSE 162* 120*  BUN 20 18  CREATININE 1.12 0.87  CALCIUM 7.7* 6.7*  MG 1.8  --   AST 36 23  ALT 28 23  ALKPHOS 122 77  BILITOT 2.0* 2.9*   ------------------------------------------------------------------------------------------------------------------  Cardiac Enzymes  Recent Labs Lab 05/01/15 1633  TROPONINI 0.03   ------------------------------------------------------------------------------------------------------------------  RADIOLOGY:  Dg Chest Port 1 View  05/01/2015  CLINICAL DATA:  Weakness following chemotherapy EXAM: PORTABLE CHEST 1 VIEW COMPARISON:  04/19/2013 FINDINGS: Cardiac shadow is stable. Elevation of left hemidiaphragm is again seen. No focal infiltrate or sizable effusion is noted. Nodular density is noted overlying the left lung base. No other focal abnormality is seen. IMPRESSION: Nodular density in the left lung base. This may be related to the patient's known clinical history. Nonemergent CT of the chest is recommended to assess. No acute infiltrate is seen. Electronically Signed   By: Inez Catalina M.D.   On: 05/01/2015 17:13   US Abdomen Limited  Ruq  05/02/2015  CLINICAL DATA:  Elevated bilirubin.  Inpatient. EXAM: US ABDOMEN LIMITED - RIGHT UPPER QUADRANT COMPARISON:  None. FINDINGS: Gallbladder: No gallstones or wall thickening visualized. No sonographic Murphy sign noted by sonographer. Common bile duct: Diameter: 3 mm Liver: Liver parenchyma is diffusely mildly echogenic with mild posterior acoustic attenuation, in keeping with mild diffuse hepatic steatosis. No definite liver surface irregularity. No liver mass is detected, noting decreased sensitivity in the setting of an echogenic liver. IMPRESSION: 1. Mild diffuse hepatic steatosis. 2. Otherwise normal right upper quadrant abdominal sonogram, with no cholelithiasis and no biliary ductal dilatation. Electronically Signed   By: Ilona Sorrel M.D.   On: 05/02/2015 08:51    EKG:   Orders placed or performed during the hospital encounter of 05/01/15  . ED EKG  . ED EKG  . EKG 12-Lead  . EKG 12-Lead    ASSESSMENT AND PLAN:   *A. fib with RVR Patient is currently hypotensive still in RVR Start him on amiodarone drip as he did not respond much to Cardizem drip and wean off Cardizem drip Continue heparin drip Check TSH Cardiology consult is placed to Dr. Saralyn Pilar Levothroid as needed to support the blood pressure to Maintain map at or greater than 65 The patient is on heparin drip due to subtherapeutic INR and Coumadin pharmacy to dose. Transition to by mouth Cardizem and Lopressor if heart rate is controlled.   *Sepsis with left leg cellulitis.  The patient was treated with vancomycin in the ED.  continue vancomycin and  Zosyn pharmacy to dose. IV fluid support.  Follow-up CBC and blood culture. Surgical consult for possible debridement  *Neutropenia. -Better today, WBC 5.0 Due to chemotherapy/sepsis  Follow-up CBC and oncology consult.  *Thrombocytopenia. Probably heparin-induced  Follow-up CBC. History of processes cancer. Follow-up oncology consult.   *Elevated  bilirubin. Follow-up CMP  abdominal ultrasound with hepatic steatosis     All the records are reviewed and case discussed with Care Management/Social Workerr. Management plans discussed with the patient, daughter and they are in agreement.  CODE STATUS: fc   TOTAL  Critical care TIME TAKING CARE OF THIS PATIENT: 41 minutes.  More than 50% time was spent on coordination of care and counseling  POSSIBLE D/C IN 3-4  DAYS, DEPENDING ON CLINICAL CONDITION.   [name] M.D on 05/02/2015 at 9:08 AM  Between 7am to 6pm - Pager - [phone] After 6pm go to [email or web address] - password [email or web address] [facility]  [facility]  Office  [phone]  CC: Primary care physician; No primary care provider on file.

## 2015-05-02 NOTE — Consult Note (Signed)
[place] for heparin drip Indication: atrial fibrillation  Allergies  Allergen Reactions  . Latex Rash    Patient Measurements: Height: 6\' 1"  (185.4 cm) Weight: 217 lb 2.5 oz (98.5 kg) IBW/kg (Calculated) : 79.9 Heparin Dosing Weight: 97.2kg  Vital Signs: Temp: 98.5 F (36.9 C) (02/27 0800) Temp Source: Oral (02/27 0800) BP: 93/66 mmHg (02/27 1600) Pulse Rate: 92 (02/27 1600)  Labs:  Recent Labs  05/01/15 1633 05/02/15 0614 05/02/15 1415  HGB 13.5 12.1*  --   HCT 39.5* 35.5*  --   PLT 116* 96*  --   APTT 49*  --   --   LABPROT 19.3* 22.2*  --   INR 1.62 1.96  --   HEPARINUNFRC  --  0.57 0.67  CREATININE 1.12 0.87  --   TROPONINI 0.03  --   --     Estimated Creatinine Clearance: 79.4 mL/min (by C-G formula based on Cr of 0.87).   Medical History: Past Medical History  Diagnosis Date  . COPD (chronic obstructive pulmonary disease) ([place])   . Cancer [facility])     Prostate with bone mets-current chemo treatment  . Hyperlipidemia   . Atrial fibrillation ([facility])     Medications:  Scheduled:  . antiseptic oral rinse  7 mL Mouth Rinse BID  . atorvastatin  80 mg Oral QPM  . diltiazem  360 mg Oral BH-q7a  . metoprolol succinate  100 mg Oral BH-q7a  . piperacillin-tazobactam (ZOSYN)  IV  3.375 g Intravenous 3 times per day  . predniSONE  10 mg Oral Daily  . vancomycin  1,000 mg Intravenous Q12H  . warfarin  4 mg Oral q1800  . Warfarin - Pharmacist Dosing Inpatient   Does not apply q1800    Assessment: Pt is a 82 year old male with a PMH of chronic afib who presents in Afib RVR. INR on admission is subtherapeutic 1.6. Pharmacy consulted to dose heparin drip while INR is subtherapeutic. Patient currently receiving heparin at 1450 units/hr.    Goal of Therapy:  Heparin level 0.3-0.7 units/ml Monitor platelets by anticoagulation protocol: Yes   Plan:   Will continue patient on heparin at 1450 units/hr. Will obtain follow-up  anti-Xa level at 0500.   Will continue patient on Warfarin 4mg  q1800. Will obtain INR with am labs.   Pharmacy will continue to monitor and adjust per consult.     Simpson,Michael L 05/02/2015,4:17 PM

## 2015-05-02 NOTE — Procedures (Signed)
Central Venous Catheter Placement: Indication: Patient receiving vesicant or irritant drug.; Patient receiving intravenous therapy for longer than 5 days.; Patient has limited or no vascular access.   Consent:written  Risks and benefits explained in detail including risk of infection, bleeding, respiratory failure and death..   Hand washing performed prior to starting the procedure.   Procedure: An active timeout was performed and correct patient, name, & ID confirmed.  After explaining risk and benefits, patient was positioned correctly for central venous access. Patient was prepped using strict sterile technique including chlorohexadine preps, sterile drape, sterile gown and sterile gloves.  The area was prepped, draped and anesthetized in the usual sterile manner. Patient comfort was obtained.  A triple lumen catheter was placed in RT Internal Jugular Vein There was good blood return, catheter caps were placed on lumens, catheter flushed easily, the line was secured and a sterile dressing and BIO-PATCH applied.   Ultrasound was used to visualize vasculature and guidance of needle.   Number of Attempts: 1 Complications:none Estimated Blood Loss: none Chest Radiograph indicated and ordered.  Operator: Aftyn Nott.   Corrin Parker, M.D.  [facility]  Medical Director [place] Director [facility] Cardio-Pulmonary Department

## 2015-05-02 NOTE — Consult Note (Signed)
Reason for Consult: Atrial fibrillation rapid ventricular) cellulitis hypotension Referring Physician: Dr Edd Fabian emergency room, Dr. Manuella Ghazi hospitalist  Chad Kirby is an 82 y.o. male.  HPI: Patient history of metastatic prostate cancer chronic atrial fibrillation on Coumadin COPD cellulitis of the left leg has been on chemotherapy lasted to last Friday but reportedly presented with weakness fatigue fever chills nausea but no vomiting or diarrhea and hypertension. Patient found to have rapid atrial fibrillation out of control of normal A. Fib. Denies significant chest pain mild shortness of breath has had elevated temperature injured his left leg a week ago and is gotten progressively worse in the last few days now presents to have significant cellulitis of his leg and possible sepsis.  Past Medical History  Diagnosis Date  . COPD (chronic obstructive pulmonary disease) ([place])   . Cancer [facility])     Prostate with bone mets-current chemo treatment  . Hyperlipidemia   . Atrial fibrillation [facility])     Past Surgical History  Procedure Laterality Date  . Replacement total knee Right     No family history on file.  Social History:  reports that he has quit smoking. He does not have any smokeless tobacco history on file. He reports that he drinks alcohol. His drug history is not on file.  Allergies:  Allergies  Allergen Reactions  . Latex Rash    Medications: I have reviewed the patient's current medications.  Results for orders placed or performed during the hospital encounter of 05/01/15 (from the past 48 hour(s))  Urinalysis complete, with microscopic ([facility] only)     Status: Abnormal   Collection Time: 05/01/15  4:17 PM  Result Value Ref Range   Color, Urine AMBER (A) YELLOW   APPearance CLEAR (A) CLEAR   Glucose, UA 50 (A) NEGATIVE mg/dL   Bilirubin Urine NEGATIVE NEGATIVE   Ketones, ur NEGATIVE NEGATIVE mg/dL   Specific Gravity, Urine 1.023 1.005 - 1.030   Hgb urine dipstick 1+  (A) NEGATIVE   pH 6.0 5.0 - 8.0   Protein, ur 100 (A) NEGATIVE mg/dL   Nitrite NEGATIVE NEGATIVE   Leukocytes, UA NEGATIVE NEGATIVE   RBC / HPF 0-5 0 - 5 RBC/hpf   WBC, UA 0-5 0 - 5 WBC/hpf   Bacteria, UA NONE SEEN NONE SEEN   Squamous Epithelial / LPF 0-5 (A) NONE SEEN   Mucous PRESENT   Urine culture     Status: None (Preliminary result)   Collection Time: 05/01/15  4:17 PM  Result Value Ref Range   Specimen Description URINE, RANDOM    Special Requests NONE    Culture TOO YOUNG TO READ    Report Status PENDING   Rapid Influenza A&B Antigens ([facility] only)     Status: None   Collection Time: 05/01/15  4:32 PM  Result Value Ref Range   Influenza A ([facility]) NOT DETECTED    Influenza B ([facility]) NOT DETECTED   CBC with Differential     Status: Abnormal   Collection Time: 05/01/15  4:33 PM  Result Value Ref Range   WBC 1.3 (LL) 3.8 - 10.6 K/uL    Comment: REPEATED TO VERIFY TOO FEW TO COUNT, SMEAR AVAILABLE FOR REVIEW CRITICAL RESULT CALLED TO, READ BACK BY AND VERIFIED WITH: [facility] WEAVER 05/01/15 AT 1730 BY HS    RBC 3.63 (L) 4.40 - 5.90 MIL/uL   Hemoglobin 13.5 13.0 - 18.0 g/dL   HCT 39.5 (L) 40.0 - 52.0 %   MCV 108.8 (H) 80.0 -  100.0 fL   MCH 37.1 (H) 26.0 - 34.0 pg   MCHC 34.1 32.0 - 36.0 g/dL   RDW 14.0 11.5 - 14.5 %   Platelets 116 (L) 150 - 440 K/uL   Neutrophils Relative % 47 %   Lymphocytes Relative 15 %   Monocytes Relative 24 %   Eosinophils Relative 1 %   Basophils Relative 0 %   Band Neutrophils 10 %   Metamyelocytes Relative 2 %   Myelocytes 1 %   Promyelocytes Absolute 0 %   Blasts 0 %   nRBC 15 (H) 0 /100 WBC   Other 0 %   Neutro Abs 0.8 (L) 1.4 - 6.5 K/uL   Lymphs Abs 0.2 (L) 1.0 - 3.6 K/uL   Monocytes Absolute 0.3 0.2 - 1.0 K/uL   Eosinophils Absolute 0.0 0 - 0.7 K/uL   Basophils Absolute 0.0 0 - 0.1 K/uL   RBC Morphology MIXED RBC POPULATION    WBC Morphology TOXIC GRANULATION     Comment: VACUOLATED NEUTROPHILS   Smear Review      PLATELET CLUMPS  NOTED ON SMEAR, COUNT APPEARS ADEQUATE    Comment: PATH REVIEW HAS BEEN ORDERED ON THIS ACCESSION NUMBER  Comprehensive metabolic panel     Status: Abnormal   Collection Time: 05/01/15  4:33 PM  Result Value Ref Range   Sodium 134 (L) 135 - 145 mmol/L   Potassium 4.0 3.5 - 5.1 mmol/L   Chloride 101 101 - 111 mmol/L   CO2 23 22 - 32 mmol/L   Glucose, Bld 162 (H) 65 - 99 mg/dL   BUN 20 6 - 20 mg/dL   Creatinine, Ser 1.12 0.61 - 1.24 mg/dL   Calcium 7.7 (L) 8.9 - 10.3 mg/dL   Total Protein 6.3 (L) 6.5 - 8.1 g/dL   Albumin 2.8 (L) 3.5 - 5.0 g/dL   AST 36 15 - 41 U/L   ALT 28 17 - 63 U/L   Alkaline Phosphatase 122 38 - 126 U/L   Total Bilirubin 2.0 (H) 0.3 - 1.2 mg/dL   GFR calc non Af Amer 59 (L) >60 mL/min   GFR calc Af Amer >60 >60 mL/min    Comment: (NOTE) The eGFR has been calculated using the CKD EPI equation. This calculation has not been validated in all clinical situations. eGFR's persistently <60 mL/min signify possible Chronic Kidney Disease.    Anion gap 10 5 - 15  Troponin I     Status: None   Collection Time: 05/01/15  4:33 PM  Result Value Ref Range   Troponin I 0.03 <0.031 ng/mL    Comment:        NO INDICATION OF MYOCARDIAL INJURY.   Protime-INR     Status: Abnormal   Collection Time: 05/01/15  4:33 PM  Result Value Ref Range   Prothrombin Time 19.3 (H) 11.4 - 15.0 seconds   INR 1.62   APTT     Status: Abnormal   Collection Time: 05/01/15  4:33 PM  Result Value Ref Range   aPTT 49 (H) 24 - 36 seconds    Comment:        IF BASELINE aPTT IS ELEVATED, SUGGEST PATIENT RISK ASSESSMENT BE USED TO DETERMINE APPROPRIATE ANTICOAGULANT THERAPY.   Magnesium     Status: None   Collection Time: 05/01/15  4:33 PM  Result Value Ref Range   Magnesium 1.8 1.7 - 2.4 mg/dL  Blood culture (routine x 2)     Status: None (Preliminary result)  Collection Time: 05/01/15  4:34 PM  Result Value Ref Range   Specimen Description BLOOD RIGHT FOREARM    Special Requests  BOTTLES DRAWN AEROBIC AND ANAEROBIC  1CC    Culture  Setup Time      GRAM POSITIVE COCCI AEROBIC BOTTLE ONLY CRITICAL RESULT CALLED TO, READ BACK BY AND VERIFIED WITH: HENRY ZOMPA 05/02/15 1030AM MLM Organism ID to follow    Culture STREPTOCOCCUS SPECIES AEROBIC BOTTLE ONLY    Report Status PENDING   Lactic acid, plasma     Status: Abnormal   Collection Time: 05/01/15  4:34 PM  Result Value Ref Range   Lactic Acid, Venous 2.8 (HH) 0.5 - 2.0 mmol/L    Comment: CRITICAL RESULT CALLED TO, READ BACK BY AND VERIFIED WITH SANDRA WEAVER AT 7341 ON 05/01/15.Marland KitchenMarland KitchenTurning Point Hospital   Blood Culture ID Panel (Reflexed)     Status: Abnormal   Collection Time: 05/01/15  4:34 PM  Result Value Ref Range   Enterococcus species NOT DETECTED NOT DETECTED   Vancomycin resistance NOT DETECTED NOT DETECTED   Listeria monocytogenes NOT DETECTED NOT DETECTED   Staphylococcus species NOT DETECTED NOT DETECTED   Staphylococcus aureus NOT DETECTED NOT DETECTED   Methicillin resistance NOT DETECTED NOT DETECTED   Streptococcus species DETECTED (A) NOT DETECTED    Comment: CRITICAL RESULT CALLED TO, READ BACK BY AND VERIFIED WITH: HENRY ZOMPA 05/02/15 1030AM MLM    Streptococcus agalactiae NOT DETECTED NOT DETECTED   Streptococcus pneumoniae NOT DETECTED NOT DETECTED   Streptococcus pyogenes NOT DETECTED NOT DETECTED   Acinetobacter baumannii NOT DETECTED NOT DETECTED   Enterobacteriaceae species NOT DETECTED NOT DETECTED   Enterobacter cloacae complex NOT DETECTED NOT DETECTED   Escherichia coli NOT DETECTED NOT DETECTED   Klebsiella oxytoca NOT DETECTED NOT DETECTED   Klebsiella pneumoniae NOT DETECTED NOT DETECTED   Proteus species NOT DETECTED NOT DETECTED   Serratia marcescens NOT DETECTED NOT DETECTED   Carbapenem resistance NOT DETECTED NOT DETECTED   Haemophilus influenzae NOT DETECTED NOT DETECTED   Neisseria meningitidis NOT DETECTED NOT DETECTED   Pseudomonas aeruginosa NOT DETECTED NOT DETECTED   Candida  albicans NOT DETECTED NOT DETECTED   Candida glabrata NOT DETECTED NOT DETECTED   Candida krusei NOT DETECTED NOT DETECTED   Candida parapsilosis NOT DETECTED NOT DETECTED   Candida tropicalis NOT DETECTED NOT DETECTED  Lactic acid, plasma     Status: Abnormal   Collection Time: 05/01/15  7:14 PM  Result Value Ref Range   Lactic Acid, Venous 2.4 (HH) 0.5 - 2.0 mmol/L    Comment: CRITICAL RESULT CALLED TO, READ BACK BY AND VERIFIED WITH BRITTANY TRAVEIS AT 1952 05/01/15.PMH  Glucose, capillary     Status: Abnormal   Collection Time: 05/01/15  7:56 PM  Result Value Ref Range   Glucose-Capillary 116 (H) 65 - 99 mg/dL  MRSA PCR Screening     Status: None   Collection Time: 05/01/15  8:00 PM  Result Value Ref Range   MRSA by PCR NEGATIVE NEGATIVE    Comment:        The GeneXpert MRSA Assay (FDA approved for NASAL specimens only), is one component of a comprehensive MRSA colonization surveillance program. It is not intended to diagnose MRSA infection nor to guide or monitor treatment for MRSA infections.   Lactic acid, plasma     Status: None   Collection Time: 05/01/15 10:49 PM  Result Value Ref Range   Lactic Acid, Venous 1.5 0.5 - 2.0 mmol/L  Lactic acid,  plasma     Status: None   Collection Time: 05/02/15  1:52 AM  Result Value Ref Range   Lactic Acid, Venous 1.1 0.5 - 2.0 mmol/L  Protime-INR     Status: Abnormal   Collection Time: 05/02/15  6:14 AM  Result Value Ref Range   Prothrombin Time 22.2 (H) 11.4 - 15.0 seconds   INR 1.96   CBC     Status: Abnormal   Collection Time: 05/02/15  6:14 AM  Result Value Ref Range   WBC 5.0 3.8 - 10.6 K/uL   RBC 3.23 (L) 4.40 - 5.90 MIL/uL   Hemoglobin 12.1 (L) 13.0 - 18.0 g/dL   HCT 35.5 (L) 40.0 - 52.0 %   MCV 109.9 (H) 80.0 - 100.0 fL   MCH 37.4 (H) 26.0 - 34.0 pg   MCHC 34.1 32.0 - 36.0 g/dL   RDW 14.1 11.5 - 14.5 %   Platelets 96 (L) 150 - 440 K/uL  Comprehensive metabolic panel     Status: Abnormal   Collection Time:  05/02/15  6:14 AM  Result Value Ref Range   Sodium 135 135 - 145 mmol/L   Potassium 3.7 3.5 - 5.1 mmol/L   Chloride 106 101 - 111 mmol/L   CO2 21 (L) 22 - 32 mmol/L   Glucose, Bld 120 (H) 65 - 99 mg/dL   BUN 18 6 - 20 mg/dL   Creatinine, Ser 0.87 0.61 - 1.24 mg/dL   Calcium 6.7 (L) 8.9 - 10.3 mg/dL   Total Protein 5.3 (L) 6.5 - 8.1 g/dL   Albumin 2.2 (L) 3.5 - 5.0 g/dL   AST 23 15 - 41 U/L   ALT 23 17 - 63 U/L   Alkaline Phosphatase 77 38 - 126 U/L   Total Bilirubin 2.9 (H) 0.3 - 1.2 mg/dL   GFR calc non Af Amer >60 >60 mL/min   GFR calc Af Amer >60 >60 mL/min    Comment: (NOTE) The eGFR has been calculated using the CKD EPI equation. This calculation has not been validated in all clinical situations. eGFR's persistently <60 mL/min signify possible Chronic Kidney Disease.    Anion gap 8 5 - 15  Heparin level (unfractionated)     Status: None   Collection Time: 05/02/15  6:14 AM  Result Value Ref Range   Heparin Unfractionated 0.57 0.30 - 0.70 IU/mL    Comment:        IF HEPARIN RESULTS ARE BELOW EXPECTED VALUES, AND PATIENT DOSAGE HAS BEEN CONFIRMED, SUGGEST FOLLOW UP TESTING OF ANTITHROMBIN III LEVELS.   TSH     Status: None   Collection Time: 05/02/15  6:14 AM  Result Value Ref Range   TSH 1.162 0.350 - 4.500 uIU/mL  Heparin level (unfractionated)     Status: None   Collection Time: 05/02/15  2:15 PM  Result Value Ref Range   Heparin Unfractionated 0.67 0.30 - 0.70 IU/mL    Comment:        IF HEPARIN RESULTS ARE BELOW EXPECTED VALUES, AND PATIENT DOSAGE HAS BEEN CONFIRMED, SUGGEST FOLLOW UP TESTING OF ANTITHROMBIN III LEVELS.     Dg Chest Port 1 View  05/02/2015  CLINICAL DATA:  Central line placement. History of prostate carcinoma with bone Mets. EXAM: PORTABLE CHEST 1 VIEW COMPARISON:  05/01/2015 FINDINGS: There is no evidence of a central line on the included field of view. There is no pneumothorax. Mild enlargement of the cardiac silhouette. No mediastinal  or hilar masses or convincing adenopathy. There  is opacity at the left lung base similar to the prior study likely chronic atelectasis or scarring. Lungs otherwise clear. No pleural effusion. Bony thorax is demineralized but grossly intact. IMPRESSION: 1. No evidence of a central line on the included field of view. 2. No pneumothorax.  No acute cardiopulmonary disease. Electronically Signed   By: Lajean Manes M.D.   On: 05/02/2015 16:12   Dg Chest Port 1 View  05/01/2015  CLINICAL DATA:  Weakness following chemotherapy EXAM: PORTABLE CHEST 1 VIEW COMPARISON:  04/19/2013 FINDINGS: Cardiac shadow is stable. Elevation of left hemidiaphragm is again seen. No focal infiltrate or sizable effusion is noted. Nodular density is noted overlying the left lung base. No other focal abnormality is seen. IMPRESSION: Nodular density in the left lung base. This may be related to the patient's known clinical history. Nonemergent CT of the chest is recommended to assess. No acute infiltrate is seen. Electronically Signed   By: Inez Catalina M.D.   On: 05/01/2015 17:13   US Abdomen Limited Ruq  05/02/2015  CLINICAL DATA:  Elevated bilirubin.  Inpatient. EXAM: US ABDOMEN LIMITED - RIGHT UPPER QUADRANT COMPARISON:  None. FINDINGS: Gallbladder: No gallstones or wall thickening visualized. No sonographic Murphy sign noted by sonographer. Common bile duct: Diameter: 3 mm Liver: Liver parenchyma is diffusely mildly echogenic with mild posterior acoustic attenuation, in keeping with mild diffuse hepatic steatosis. No definite liver surface irregularity. No liver mass is detected, noting decreased sensitivity in the setting of an echogenic liver. IMPRESSION: 1. Mild diffuse hepatic steatosis. 2. Otherwise normal right upper quadrant abdominal sonogram, with no cholelithiasis and no biliary ductal dilatation. Electronically Signed   By: Ilona Sorrel M.D.   On: 05/02/2015 08:51    Review of Systems  Constitutional: Positive for fever,  chills, malaise/fatigue and diaphoresis.  HENT: Negative.   Eyes: Negative.   Respiratory: Negative.   Cardiovascular: Positive for palpitations and leg swelling.  Gastrointestinal: Negative.   Genitourinary: Negative.   Musculoskeletal: Positive for myalgias.  Skin: Negative.   Neurological: Positive for weakness.  Endo/Heme/Allergies: Negative.    Blood pressure 93/66, pulse 92, temperature 98.5 F (36.9 C), temperature source Oral, resp. rate 24, height 6' 1"  (1.854 m), weight 98.5 kg (217 lb 2.5 oz), SpO2 97 %. Physical Exam  Nursing note and vitals reviewed. Constitutional: He is oriented to person, place, and time. He appears well-developed and well-nourished.  HENT:  Head: Normocephalic and atraumatic.  Eyes: Conjunctivae and EOM are normal. Pupils are equal, round, and reactive to light.  Neck: Normal range of motion. Neck supple.  Cardiovascular: Normal rate, regular rhythm and normal heart sounds.   Respiratory: Effort normal and breath sounds normal.  GI: Soft. Bowel sounds are normal.  Musculoskeletal: Normal range of motion.  Neurological: He is alert and oriented to person, place, and time. He has normal reflexes.  Skin: Lesion noted. There is erythema.  Psychiatric: He has a normal mood and affect.    Assessment/Plan: Atrial fibrillation rapid ventricular response COPD Weakness Cellulitis Hyperlipidemia Metastatic prostate cancer Hypertension . PLAN Agree with admitted placed on rate control with diltiazem and amiodarone Anticoagulation continue Coumadin therapy Agree with broad-spectrum antibiotics for cellulitis Wean pressors for hypotension Continue fluid resuscitation as necessary DVT prophylaxis Continue Lipitor for lipid management Rate control consider digoxin Wound Care consider infectious disease Consider echocardiogram for possible endocarditis Conservative cardiology input  CALLWOOD,DWAYNE D. 05/02/2015, 4:30 PM

## 2015-05-02 NOTE — Care Management (Signed)
Patient admitted to stepdown due to need for cardizem drip for atrial fib and code sepsis.  he is currently undergoing chem for prostate cancer with metastasis to the bone.  Had first treatment one week ago at [facility]

## 2015-05-02 NOTE — Consult Note (Signed)
ANTICOAGULATION CONSULT NOTE - Initial Consult  Pharmacy Consult for heparin drip Indication: atrial fibrillation  Allergies  Allergen Reactions  . Latex Rash    Patient Measurements: Height: 6\' 1"  (185.4 cm) Weight: 217 lb 2.5 oz (98.5 kg) IBW/kg (Calculated) : 79.9 Heparin Dosing Weight: 97.2kg  Vital Signs: Temp: 98 F (36.7 C) (02/27 0430) Temp Source: Oral (02/27 0430) BP: 102/60 mmHg (02/27 0430) Pulse Rate: 134 (02/27 0430)  Labs:  Recent Labs  05/01/15 1633 05/02/15 0614  HGB 13.5  --   HCT 39.5*  --   PLT 116*  --   APTT 49*  --   LABPROT 19.3*  --   INR 1.62  --   HEPARINUNFRC  --  0.57  CREATININE 1.12  --   TROPONINI 0.03  --     Estimated Creatinine Clearance: 61.7 mL/min (by C-G formula based on Cr of 1.12).   Medical History: Past Medical History  Diagnosis Date  . COPD (chronic obstructive pulmonary disease) ([place])   . Cancer [facility])     Prostate with bone mets-current chemo treatment  . Hyperlipidemia   . Atrial fibrillation ([facility])     Medications:  Scheduled:  . antiseptic oral rinse  7 mL Mouth Rinse BID  . atorvastatin  80 mg Oral QPM  . diltiazem  360 mg Oral BH-q7a  . metoprolol succinate  100 mg Oral BH-q7a  . piperacillin-tazobactam (ZOSYN)  IV  3.375 g Intravenous 3 times per day  . predniSONE  10 mg Oral Daily  . vancomycin  1,000 mg Intravenous Q12H  . Warfarin - Pharmacist Dosing Inpatient   Does not apply q1800    Assessment: Pt is a 82 year old male with a PMH of chronic afib who presents in Afib RVR. INR on admission is subtherapeutic 1.6. Pharmacy consulted to dose heparin drip since INR is subtherapeutic.  Goal of Therapy:  Heparin level 0.3-0.7 units/ml Monitor platelets by anticoagulation protocol: Yes   Plan: Will not bolus since INR is elevated.  Start heparin infusion at 1450 units/hr Check anti-Xa level in 8 hours and daily while on heparin Continue to monitor H&H and platelets   2/27 AM heparin level  0.57. Recheck in 8 hours to confirm.  Nicolas Banh S 05/02/2015,6:53 AM

## 2015-05-02 NOTE — Progress Notes (Signed)
Spoke with Dr. Marcille Blanco regarding AM scheduled Cardizem and metoprolol PO. Pt currently in Cardizem drip @7 .5. I have had to titrate drip down because of a drop in BP. I was concerned giving both AM meds. Per Dr. Marcille Blanco, give PO Cardizem, hold PO Metoprolol until physician rounds. This RN will continue to monitor pt closely. Pt resting at this time. Breathing has improved.

## 2015-05-02 NOTE — Progress Notes (Signed)
Patient ID: Chad Kirby, male   DOB: 1929/12/13, 82 y.o.   MRN: [id number]  HPI Chad Kirby is a 82 y.o. male   HPI Patient seen in consultation for left leg wound and cellulitis. Patient is 25 debilitated with a history of A. fib on anticoagulation. He had a history of metastatic prostate cancer on chemotherapy found to be pancytopenic. He was admitted to the hospital secondary to left lower [place] insulitis, failure to thrive and generalized weakness. She was started on broad-spectrum antibiotics including Zosyn and vancomycin. Apparently patient reports having a cut on his left lower leg and now has increase in redness. There is some pain that is intermittent is mild to moderate and dull in nature.  Past Medical History  Diagnosis Date  . COPD (chronic obstructive pulmonary disease) ([place])   . Cancer [facility])     Prostate with bone mets-current chemo treatment  . Hyperlipidemia   . Atrial fibrillation [facility])     Past Surgical History  Procedure Laterality Date  . Replacement total knee Right     No family history on file.  Social History Social History  Substance Use Topics  . Smoking status: Former Research scientist (life sciences)  . Smokeless tobacco: None  . Alcohol Use: Yes     Comment: occasional    Allergies  Allergen Reactions  . Latex Rash    Current Facility-Administered Medications  Medication Dose Route Frequency Provider Last Rate Last Dose  . 0.9 %  sodium chloride infusion   Intravenous Continuous Demetrios Loll, MD 75 mL/hr at 05/01/15 2143    . acetaminophen (TYLENOL) tablet 650 mg  650 mg Oral Q6H PRN Demetrios Loll, MD       Or  . acetaminophen (TYLENOL) suppository 650 mg  650 mg Rectal Q6H PRN Demetrios Loll, MD      . albuterol (PROVENTIL) (2.5 MG/3ML) 0.083% nebulizer solution 2.5 mg  2.5 mg Nebulization Q2H PRN Demetrios Loll, MD      . amiodarone (NEXTERONE PREMIX) 360 MG/200ML (1.8 mg/mL) IV infusion  30 mg/hr Intravenous Continuous Aruna Gouru, MD 16.7 mL/hr at 05/02/15 1515 30 mg/hr  at 05/02/15 1515  . antiseptic oral rinse (CPC / CETYLPYRIDINIUM CHLORIDE 0.05%) solution 7 mL  7 mL Mouth Rinse BID [name], MD   7 mL at 05/02/15 0952  . atorvastatin (LIPITOR) tablet 80 mg  80 mg Oral QPM Demetrios Loll, MD   80 mg at 05/01/15 2107  . diltiazem (CARDIZEM CD) 24 hr capsule 360 mg  360 mg Oral Beaulah Dinning, MD   360 mg at 05/02/15 [phone]  . diltiazem (CARDIZEM) 100 mg in dextrose 5 % 100 mL (1 mg/mL) infusion  5-15 mg/hr Intravenous Titrated Joanne Gavel, MD   Stopped at 05/02/15 1220  . heparin ADULT infusion 100 units/mL (25000 units/250 mL)  1,450 Units/hr Intravenous Continuous Demetrios Loll, MD   Stopped at 05/02/15 1430  . HYDROcodone-acetaminophen (NORCO/VICODIN) 5-325 MG per tablet 1 tablet  1 tablet Oral Q4H PRN Demetrios Loll, MD   1 tablet at 05/01/15 2154  . metoprolol succinate (TOPROL-XL) 24 hr tablet 100 mg  100 mg Oral Beaulah Dinning, MD   100 mg at 05/02/15 [phone]  . norepinephrine (LEVOPHED) 4 mg in dextrose 5 % 250 mL (0.016 mg/mL) infusion  0-40 mcg/min Intravenous Titrated [name], MD   Stopped at 05/02/15 [phone]  . ondansetron (ZOFRAN) tablet 4 mg  4 mg Oral Q6H PRN Demetrios Loll, MD       Or  .  ondansetron (ZOFRAN) injection 4 mg  4 mg Intravenous Q6H PRN Demetrios Loll, MD   4 mg at 05/01/15 2245  . piperacillin-tazobactam (ZOSYN) IVPB 3.375 g  3.375 g Intravenous 3 times per day Demetrios Loll, MD   3.375 g at 05/02/15 1459  . predniSONE (DELTASONE) tablet 10 mg  10 mg Oral Daily Demetrios Loll, MD   10 mg at 05/02/15 0951  . silver sulfADIAZINE (SILVADENE) 1 % cream   Topical Daily Matvey Llanas F Charls Custer, MD      . vancomycin (VANCOCIN) IVPB 1000 mg/200 mL premix  1,000 mg Intravenous Q12H Demetrios Loll, MD   1,000 mg at 05/02/15 1225  . warfarin (COUMADIN) tablet 4 mg  4 mg Oral q1800 Flora Lipps, MD      . Warfarin - Pharmacist Dosing Inpatient   Does not apply [id number] Demetrios Loll, MD         Review of Systems A 10 point review of systems was asked and was negative except for the  information on the HPI  Physical Exam Blood pressure 93/66, pulse 92, temperature 98.5 F (36.9 C), temperature source Oral, resp. rate 24, height 6\' 1"  (1.854 m), weight 98.5 kg (217 lb 2.5 oz), SpO2 97 %. CONSTITUTIONAL: The debilitated male, laying in bed and has limited mobility. EYES: Pupils are equal, round, and reactive to light, Sclera are non-icteric. EARS, NOSE, MOUTH AND THROAT: The oropharynx is clear. The oral mucosa is pink and moist. Hearing is intact to voice. LYMPH NODES:  Lymph nodes in the neck are normal. RESPIRATORY:  Lungs are clear. There is normal respiratory effort, with equal breath sounds bilaterally, and without pathologic use of accessory muscles. CARDIOVASCULAR: Heart is regular without murmurs, gallops, or rubs. GI: The abdomen is soft, nontender, and nondistended. There are no palpable masses. There is no hepatosplenomegaly. . GU: Rectal deferred.   MUSCULOSKELETAL: Normal muscle strength and tone. No cyanosis or edema.   SKIN: There is an area of cellulitis in the left lower extremity, there is a small open wound measuring 1 x 1 cm with some indurated area around it. There is no evidence of necrotizing infection. There is no evidence of subcutaneous emphysema. Compared with the nursing marks actually there is improvement in the erythema. No evidence of an abscess. His skin is very fragile and tears with the tape NEUROLOGIC: Motor and sensation is grossly normal. Cranial nerves are grossly intact. PSYCH:  Oriented to person, place and time. Affect is normal.  Data Reviewed I have personally reviewed the patient's imaging, laboratory findings and medical records.    Assessment/ Plan   elderly and debilitated male with multiple medical problems, failure to thrive, leukopenic and with ongoing chemotherapy now with a left large from a cellulitis and a small wound. I do not recommend any debridement patient is anticoagulated and he may have more bleeding if we  attempt to do a debridement. For now we will concentrate on local wound care we will start on daily Silvadene dressing changes. We will continue to follow up periodically. No need for any surgical intervention. I agree with current antibiotic regimen. Discussed with the family and with the patient in detail and extensive counseling provided  Caroleen Hamman, MD FACS General Surgeon 05/02/2015, 5:10 PM

## 2015-05-02 NOTE — Progress Notes (Signed)
Pt's R IJ d/c with pt's HOB flat, pt tol well, site CDI, Heparin to be restarted at [place] per MD care order, pt VSS, family at [facility], all questions answered

## 2015-05-03 LAB — URINE CULTURE

## 2015-05-03 LAB — COMPREHENSIVE METABOLIC PANEL
ALK PHOS: 94 U/L (ref 38–126)
ALT: 23 U/L (ref 17–63)
ANION GAP: 8 (ref 5–15)
AST: 27 U/L (ref 15–41)
Albumin: 2.2 g/dL — ABNORMAL LOW (ref 3.5–5.0)
BUN: 25 mg/dL — ABNORMAL HIGH (ref 6–20)
CALCIUM: 6.5 mg/dL — AB (ref 8.9–10.3)
CO2: 21 mmol/L — ABNORMAL LOW (ref 22–32)
CREATININE: 1.15 mg/dL (ref 0.61–1.24)
Chloride: 103 mmol/L (ref 101–111)
GFR, EST NON AFRICAN AMERICAN: 57 mL/min — AB (ref >60)
Glucose, Bld: 118 mg/dL — ABNORMAL HIGH (ref 65–99)
Potassium: 3.5 mmol/L (ref 3.5–5.1)
Sodium: 132 mmol/L — ABNORMAL LOW (ref 135–145)
TOTAL PROTEIN: 5.5 g/dL — AB (ref 6.5–8.1)
Total Bilirubin: 1.2 mg/dL (ref 0.3–1.2)

## 2015-05-03 LAB — CBC
HCT: 32.8 % — ABNORMAL LOW (ref 40.0–52.0)
HEMOGLOBIN: 11.5 g/dL — AB (ref 13.0–18.0)
MCH: 37.5 pg — AB (ref 26.0–34.0)
MCHC: 35.1 g/dL (ref 32.0–36.0)
MCV: 107 fL — AB (ref 80.0–100.0)
Platelets: 97 10*3/uL — ABNORMAL LOW (ref 150–440)
RBC: 3.06 MIL/uL — AB (ref 4.40–5.90)
RDW: 14.3 % (ref 11.5–14.5)
WBC: 8.2 10*3/uL (ref 3.8–10.6)

## 2015-05-03 LAB — PROTIME-INR
INR: 2.82
Prothrombin Time: 29.2 seconds — ABNORMAL HIGH (ref 11.4–15.0)

## 2015-05-03 LAB — VANCOMYCIN, TROUGH: VANCOMYCIN TR: 20 ug/mL (ref 10–20)

## 2015-05-03 LAB — HEPARIN LEVEL (UNFRACTIONATED): HEPARIN UNFRACTIONATED: 0.59 [IU]/mL (ref 0.30–0.70)

## 2015-05-03 MED ORDER — WARFARIN SODIUM 1 MG PO TABS
2.0000 mg | ORAL_TABLET | Freq: Every day | ORAL | Status: DC
  Administered 2015-05-03: 2 mg via ORAL
  Filled 2015-05-03: qty 2

## 2015-05-03 MED ORDER — VANCOMYCIN HCL IN DEXTROSE 1-5 GM/200ML-% IV SOLN
1000.0000 mg | INTRAVENOUS | Status: DC
  Filled 2015-05-03: qty 200

## 2015-05-03 MED ORDER — SENNOSIDES-DOCUSATE SODIUM 8.6-50 MG PO TABS
1.0000 | ORAL_TABLET | Freq: Two times a day (BID) | ORAL | Status: DC
  Administered 2015-05-03 – 2015-05-04 (×2): 1 via ORAL
  Filled 2015-05-03 (×2): qty 1

## 2015-05-03 MED ORDER — AMIODARONE HCL 200 MG PO TABS
200.0000 mg | ORAL_TABLET | Freq: Every day | ORAL | Status: DC
  Administered 2015-05-03 – 2015-05-06 (×4): 200 mg via ORAL
  Filled 2015-05-03 (×5): qty 1

## 2015-05-03 MED ORDER — POLYETHYLENE GLYCOL 3350 17 G PO PACK
17.0000 g | PACK | Freq: Two times a day (BID) | ORAL | Status: DC
  Administered 2015-05-03 – 2015-05-05 (×6): 17 g via ORAL
  Filled 2015-05-03 (×6): qty 1

## 2015-05-03 MED ORDER — CALCIUM CARBONATE-VITAMIN D 500-200 MG-UNIT PO TABS
2.0000 | ORAL_TABLET | Freq: Two times a day (BID) | ORAL | Status: DC
  Administered 2015-05-03 – 2015-05-06 (×6): 2 via ORAL
  Filled 2015-05-03 (×6): qty 2

## 2015-05-03 MED ORDER — VANCOMYCIN HCL IN DEXTROSE 750-5 MG/150ML-% IV SOLN
750.0000 mg | Freq: Two times a day (BID) | INTRAVENOUS | Status: DC
  Administered 2015-05-04 (×2): 750 mg via INTRAVENOUS
  Filled 2015-05-03 (×3): qty 150

## 2015-05-03 MED ORDER — MAGNESIUM HYDROXIDE 400 MG/5ML PO SUSP
30.0000 mL | Freq: Two times a day (BID) | ORAL | Status: AC
  Administered 2015-05-03: 30 mL via ORAL
  Filled 2015-05-03 (×2): qty 30

## 2015-05-03 MED ORDER — WARFARIN SODIUM 1 MG PO TABS: 1.0000 mg | ORAL_TABLET | Freq: Every day | ORAL | Status: DC

## 2015-05-03 MED ORDER — GABAPENTIN 300 MG PO CAPS
300.0000 mg | ORAL_CAPSULE | Freq: Two times a day (BID) | ORAL | Status: DC
  Administered 2015-05-03 – 2015-05-06 (×6): 300 mg via ORAL
  Filled 2015-05-03 (×6): qty 1

## 2015-05-03 NOTE — Care Management (Signed)
paged attending to discuss need for continued stay in icu

## 2015-05-03 NOTE — Progress Notes (Signed)
[place] NOTE  Pharmacy Consult for constipation prevention     Allergies  Allergen Reactions  . Latex Rash    Patient Measurements: Height: 6\' 1"  (185.4 cm) Weight: 217 lb 2.5 oz (98.5 kg) IBW/kg (Calculated) : 79.9  Vital Signs: Temp: 98.2 F (36.8 C) (02/28 0800) Temp Source: Oral (02/28 0800) BP: 102/68 mmHg (02/28 1100) Pulse Rate: 82 (02/28 1100) Intake/Output from previous day: 02/27 0701 - 02/28 0700 In: 3674.4 [P.O.:720; I.V.:2604.4; IV Piggyback:350] Out: [id number] [Urine:1399] Intake/Output from this shift: Total I/O In: 120 [P.O.:120] Out: 250 [Urine:250] Vent settings for last 24 hours:    Labs:  Recent Labs  05/01/15 1633 05/02/15 0614 05/03/15 0625  WBC 1.3* 5.0 8.2  HGB 13.5 12.1* 11.5*  HCT 39.5* 35.5* 32.8*  PLT 116* 96* 97*  APTT 49*  --   --   INR 1.62 1.96 2.82  CREATININE 1.12 0.87 1.15  MG 1.8  --   --   ALBUMIN 2.8* 2.2* 2.2*  PROT 6.3* 5.3* 5.5*  AST 36 23 27  ALT 28 23 23   ALKPHOS 122 77 94  BILITOT 2.0* 2.9* 1.2   Estimated Creatinine Clearance: 60.1 mL/min (by C-G formula based on Cr of 1.15).   Recent Labs  05/01/15 1956  GLUCAP 116*    Microbiology: Recent Results (from the past 720 hour(s))  Urine culture     Status: None   Collection Time: 05/01/15  4:17 PM  Result Value Ref Range Status   Specimen Description URINE, RANDOM  Final   Special Requests NONE  Final   Culture MULTIPLE SPECIES PRESENT, SUGGEST RECOLLECTION  Final   Report Status 05/03/2015 FINAL  Final  Rapid Influenza A&B Antigens ([facility] only)     Status: None   Collection Time: 05/01/15  4:32 PM  Result Value Ref Range Status   Influenza A ([place]) NOT DETECTED  Final   Influenza B ([facility]) NOT DETECTED  Final  Blood culture (routine x 2)     Status: None (Preliminary result)   Collection Time: 05/01/15  4:34 PM  Result Value Ref Range Status   Specimen Description BLOOD LEFT FOREARM  Final   Special Requests BOTTLES DRAWN  AEROBIC AND ANAEROBIC  1CC  Final   Culture NO GROWTH 2 DAYS  Final   Report Status PENDING  Incomplete  Blood culture (routine x 2)     Status: None (Preliminary result)   Collection Time: 05/01/15  4:34 PM  Result Value Ref Range Status   Specimen Description BLOOD RIGHT FOREARM  Final   Special Requests BOTTLES DRAWN AEROBIC AND ANAEROBIC  1CC  Final   Culture  Setup Time   Final    GRAM POSITIVE COCCI AEROBIC BOTTLE ONLY CRITICAL RESULT CALLED TO, READ BACK BY AND VERIFIED WITH: HENRY ZOMPA 05/02/15 1030AM MLM    Culture   Final    STREPTOCOCCUS SPECIES AEROBIC BOTTLE ONLY IDENTIFICATION TO FOLLOW    Report Status PENDING  Incomplete  Blood Culture ID Panel (Reflexed)     Status: Abnormal   Collection Time: 05/01/15  4:34 PM  Result Value Ref Range Status   Enterococcus species NOT DETECTED NOT DETECTED Final   Vancomycin resistance NOT DETECTED NOT DETECTED Final   Listeria monocytogenes NOT DETECTED NOT DETECTED Final   Staphylococcus species NOT DETECTED NOT DETECTED Final   Staphylococcus aureus NOT DETECTED NOT DETECTED Final   Methicillin resistance NOT DETECTED NOT DETECTED Final   Streptococcus species DETECTED (A) NOT DETECTED Final  Comment: CRITICAL RESULT CALLED TO, READ BACK BY AND VERIFIED WITH: HENRY ZOMPA 05/02/15 1030AM MLM    Streptococcus agalactiae NOT DETECTED NOT DETECTED Final   Streptococcus pneumoniae NOT DETECTED NOT DETECTED Final   Streptococcus pyogenes NOT DETECTED NOT DETECTED Final   Acinetobacter baumannii NOT DETECTED NOT DETECTED Final   Enterobacteriaceae species NOT DETECTED NOT DETECTED Final   Enterobacter cloacae complex NOT DETECTED NOT DETECTED Final   Escherichia coli NOT DETECTED NOT DETECTED Final   Klebsiella oxytoca NOT DETECTED NOT DETECTED Final   Klebsiella pneumoniae NOT DETECTED NOT DETECTED Final   Proteus species NOT DETECTED NOT DETECTED Final   Serratia marcescens NOT DETECTED NOT DETECTED Final   Carbapenem  resistance NOT DETECTED NOT DETECTED Final   Haemophilus influenzae NOT DETECTED NOT DETECTED Final   Neisseria meningitidis NOT DETECTED NOT DETECTED Final   Pseudomonas aeruginosa NOT DETECTED NOT DETECTED Final   Candida albicans NOT DETECTED NOT DETECTED Final   Candida glabrata NOT DETECTED NOT DETECTED Final   Candida krusei NOT DETECTED NOT DETECTED Final   Candida parapsilosis NOT DETECTED NOT DETECTED Final   Candida tropicalis NOT DETECTED NOT DETECTED Final  MRSA PCR Screening     Status: None   Collection Time: 05/01/15  8:00 PM  Result Value Ref Range Status   MRSA by PCR NEGATIVE NEGATIVE Final    Comment:        The GeneXpert MRSA Assay (FDA approved for NASAL specimens only), is one component of a comprehensive MRSA colonization surveillance program. It is not intended to diagnose MRSA infection nor to guide or monitor treatment for MRSA infections.     Medications:  Scheduled:  . amiodarone  200 mg Oral Daily  . antiseptic oral rinse  7 mL Mouth Rinse BID  . atorvastatin  80 mg Oral QPM  . calcium-vitamin D  2 tablet Oral BID  . diltiazem  360 mg Oral BH-q7a  . magnesium hydroxide  30 mL Oral BID  . metoprolol succinate  100 mg Oral BH-q7a  . piperacillin-tazobactam (ZOSYN)  IV  3.375 g Intravenous 3 times per day  . polyethylene glycol  17 g Oral BID  . predniSONE  10 mg Oral Daily  . senna-docusate  1 tablet Oral BID  . silver sulfADIAZINE   Topical Daily  . [START ON 05/04/2015] vancomycin  1,000 mg Intravenous Q18H  . warfarin  2 mg Oral q1800  . Warfarin - Pharmacist Dosing Inpatient   Does not apply q1800   Infusions:  . sodium chloride 75 mL/hr at 05/02/15 2211  . amiodarone Stopped (05/03/15 1429)  . diltiazem (CARDIZEM) infusion Stopped (05/02/15 1220)  . norepinephrine (LEVOPHED) Adult infusion Stopped (05/02/15 [id number])    Assessment: Pharmacy consulted for constipation management for 82 yo male admitted with sepsis/cellulitis/wound  infection.    Plan:  Patient ordered Miralax BID. Will add docusate/senna 1 tab BID.   Pharmacy will continue to monitor and adjust per consult.    Simpson,Michael L 05/03/2015,5:14 PM

## 2015-05-03 NOTE — Progress Notes (Signed)
[facility] [place] NAME: Chad Kirby    MR#:  [id number]  DATE OF BIRTH:  [birth year]  SUBJECTIVE:  CHIEF COMPLAINT:  Patient is feeling better , constipated for 7 days , reporting abdominal discomfort .Last chemotherapy was 2 weeks ago [facility]. Denies any chest pain. Daughter and mom are at bedside  REVIEW OF SYSTEMS:  CONSTITUTIONAL: No fever, reporting fatigue or weakness.  EYES: No blurred or double vision.  EARS, NOSE, AND THROAT: No tinnitus or ear pain.  RESPIRATORY: No cough, shortness of breath, wheezing or hemoptysis.  CARDIOVASCULAR: Intermittent palpitations. No chest pain, orthopnea, edema.  GASTROINTESTINAL: No nausea, vomiting, diarrhea or abdominal pain. Constipated GENITOURINARY: No dysuria, hematuria.  ENDOCRINE: No polyuria, nocturia,  HEMATOLOGY: No anemia, easy bruising or bleeding SKIN: No rash or lesion. Left leg wound getting worse but better after starting antibiotics  MUSCULOSKELETAL: No joint pain or arthritis.   NEUROLOGIC: No tingling, numbness, weakness.  PSYCHIATRY: No anxiety or depression.   DRUG ALLERGIES:   Allergies  Allergen Reactions  . Latex Rash    VITALS:  Blood pressure 102/68, pulse 82, temperature 98.2 F (36.8 C), temperature source Oral, resp. rate 18, height 6\' 1"  (1.854 m), weight 98.5 kg (217 lb 2.5 oz), SpO2 94 %.  PHYSICAL EXAMINATION:  GENERAL:  82 y.o.-year-old patient lying in the bed with no acute distress.  EYES: Pupils equal, round, reactive to light and accommodation. No scleral icterus. Extraocular muscles intact.  HEENT: Head atraumatic, normocephalic. Oropharynx and nasopharynx clear.  NECK:  Supple, no jugular venous distention. No thyroid enlargement, no tenderness.  LUNGS: Normal breath sounds bilaterally, no wheezing, rales,rhonchi or crepitation. No use of accessory muscles of respiration.  CARDIOVASCULAR: Irregularly irregular. No murmurs, rubs, or  gallops.  ABDOMEN: Soft, nontender, distended. Bowel sounds present. No organomegaly or mass.  EXTREMITIES: Left lower extremity-wound with purulent discharge, tender with some edema, erythema is better after starting IV antibiotics. No pedal edema, cyanosis, or clubbing.  NEUROLOGIC: Cranial nerves II through XII are intact. Muscle strength 5/5 in all extremities. Sensation intact. Gait not checked.  PSYCHIATRIC: The patient is alert and oriented x 3.  SKIN: No obvious rash, lesion, or ulcer.    LABORATORY PANEL:   CBC  Recent Labs Lab 05/03/15 0625  WBC 8.2  HGB 11.5*  HCT 32.8*  PLT 97*   ------------------------------------------------------------------------------------------------------------------  Chemistries   Recent Labs Lab 05/01/15 1633  05/03/15 0625  NA 134*  < > 132*  K 4.0  < > 3.5  CL 101  < > 103  CO2 23  < > 21*  GLUCOSE 162*  < > 118*  BUN 20  < > 25*  CREATININE 1.12  < > 1.15  CALCIUM 7.7*  < > 6.5*  MG 1.8  --   --   AST 36  < > 27  ALT 28  < > 23  ALKPHOS 122  < > 94  BILITOT 2.0*  < > 1.2  < > = values in this interval not displayed. ------------------------------------------------------------------------------------------------------------------  Cardiac Enzymes  Recent Labs Lab 05/01/15 1633  TROPONINI 0.03   ------------------------------------------------------------------------------------------------------------------  RADIOLOGY:  Dg Chest Port 1 View  05/02/2015  CLINICAL DATA:  82 year old male with PICC line placement. EXAM: PORTABLE CHEST 1 VIEW COMPARISON:  Radiograph dated 05/02/2015 FINDINGS: A right-sided PICC is noted with tip over central SVC. Single-view the chest demonstrate mild eventration of the left hemidiaphragm with associated minimal subsegmental atelectatic changes of  the left lung base. There is no consolidation, pleural effusion, or pneumothorax. Stable cardiac silhouette. The osseous structures appear  unremarkable. IMPRESSION: Right-sided PICC over central SVC. Electronically Signed   By: Anner Crete M.D.   On: 05/02/2015 18:41   Dg Chest Port 1 View  05/02/2015  CLINICAL DATA:  Central line placement. History of prostate carcinoma with bone Mets. EXAM: PORTABLE CHEST 1 VIEW COMPARISON:  05/01/2015 FINDINGS: There is no evidence of a central line on the included field of view. There is no pneumothorax. Mild enlargement of the cardiac silhouette. No mediastinal or hilar masses or convincing adenopathy. There is opacity at the left lung base similar to the prior study likely chronic atelectasis or scarring. Lungs otherwise clear. No pleural effusion. Bony thorax is demineralized but grossly intact. IMPRESSION: 1. No evidence of a central line on the included field of view. 2. No pneumothorax.  No acute cardiopulmonary disease. Electronically Signed   By: Lajean Manes M.D.   On: 05/02/2015 16:12   Dg Chest Port 1 View  05/01/2015  CLINICAL DATA:  Weakness following chemotherapy EXAM: PORTABLE CHEST 1 VIEW COMPARISON:  04/19/2013 FINDINGS: Cardiac shadow is stable. Elevation of left hemidiaphragm is again seen. No focal infiltrate or sizable effusion is noted. Nodular density is noted overlying the left lung base. No other focal abnormality is seen. IMPRESSION: Nodular density in the left lung base. This may be related to the patient's known clinical history. Nonemergent CT of the chest is recommended to assess. No acute infiltrate is seen. Electronically Signed   By: Inez Catalina M.D.   On: 05/01/2015 17:13   US Abdomen Limited Ruq  05/02/2015  CLINICAL DATA:  Elevated bilirubin.  Inpatient. EXAM: US ABDOMEN LIMITED - RIGHT UPPER QUADRANT COMPARISON:  None. FINDINGS: Gallbladder: No gallstones or wall thickening visualized. No sonographic Murphy sign noted by sonographer. Common bile duct: Diameter: 3 mm Liver: Liver parenchyma is diffusely mildly echogenic with mild posterior acoustic attenuation, in  keeping with mild diffuse hepatic steatosis. No definite liver surface irregularity. No liver mass is detected, noting decreased sensitivity in the setting of an echogenic liver. IMPRESSION: 1. Mild diffuse hepatic steatosis. 2. Otherwise normal right upper quadrant abdominal sonogram, with no cholelithiasis and no biliary ductal dilatation. Electronically Signed   By: Ilona Sorrel M.D.   On: 05/02/2015 08:51    EKG:   Orders placed or performed during the hospital encounter of 05/01/15  . ED EKG  . ED EKG  . EKG 12-Lead  . EKG 12-Lead    ASSESSMENT AND PLAN:   *A. fib with RVR Heart rate is better Wean off amiodarone drip , patient is started on amiodarone 200 mg by mouth once daily   he did not respond much to Cardizem drip and weaned off Cardizem drip discontinue heparin drip, INR is therapeutic at 2.82. Continue Coumadin, monitor PT/INR Normal TSH Appreciate cardiology recommendations Levophed as needed to support the blood pressure to Maintain map at or greater than 65  Transition to by mouth Cardizem and Lopressor if heart rate is controlled.   *Sepsis with left leg cellulitis.  The patient was treated with vancomycin in the ED.  continue vancomycin and  Zosyn pharmacy to dose. IV fluid support.  Follow-up CBC and blood culture. Surgery is not currently considering debridement at this point, continue wound care with Silvadene  *Neutropenia. Resolved Due to chemotherapy/sepsis  Follow-up CBC and oncology consult.  *Thrombocytopenia. Probably heparin-induced -platelet count at 97,000 today Follow-up CBC. History of  processes cancer. Follow-up oncology consult.   *Elevated bilirubin. Resolved  Follow-up CMP total bilirubin at 1.2  abdominal ultrasound with hepatic steatosis     All the records are reviewed and case discussed with Care Management/Social Workerr. Management plans discussed with the patient, daughter and they are in agreement.  CODE STATUS: fc    TOTAL  Critical care TIME TAKING CARE OF THIS PATIENT: 41 minutes.  More than 50% time was spent on coordination of care and counseling  POSSIBLE D/C IN 3-4  DAYS, DEPENDING ON CLINICAL CONDITION.   [name] M.D on 05/03/2015 at 3:08 PM  Between 7am to 6pm - Pager - [phone] After 6pm go to [email or web address] - password [email or web address] [facility]  [place] [facility]  Office  [phone]  CC: Primary care physician; No primary care provider on file.

## 2015-05-03 NOTE — Progress Notes (Signed)
Chaplain rounded the unit visited with patient and family and provided a compassionate presence and support. Minerva Fester [phone]

## 2015-05-03 NOTE — Consult Note (Signed)
[place] for heparin drip Indication: atrial fibrillation  Allergies  Allergen Reactions  . Latex Rash    Patient Measurements: Height: 6\' 1"  (185.4 cm) Weight: 217 lb 2.5 oz (98.5 kg) IBW/kg (Calculated) : 79.9 Heparin Dosing Weight: 97.2kg  Vital Signs: Temp: 98.5 F (36.9 C) (02/28 0000) Temp Source: Oral (02/28 0000) BP: 102/69 mmHg (02/28 0635) Pulse Rate: 87 (02/28 0200)  Labs:  Recent Labs  05/01/15 1633 05/02/15 0614 05/02/15 1415 05/03/15 0625  HGB 13.5 12.1*  --   --   HCT 39.5* 35.5*  --   --   PLT 116* 96*  --   --   APTT 49*  --   --   --   LABPROT 19.3* 22.2*  --  29.2*  INR 1.62 1.96  --  2.82  HEPARINUNFRC  --  0.57 0.67 0.59  CREATININE 1.12 0.87  --  1.15  TROPONINI 0.03  --   --   --     Estimated Creatinine Clearance: 60.1 mL/min (by C-G formula based on Cr of 1.15).   Medical History: Past Medical History  Diagnosis Date  . COPD (chronic obstructive pulmonary disease) ([place])   . Cancer [facility])     Prostate with bone mets-current chemo treatment  . Hyperlipidemia   . Atrial fibrillation ([facility])     Medications:  Scheduled:  . antiseptic oral rinse  7 mL Mouth Rinse BID  . atorvastatin  80 mg Oral QPM  . diltiazem  360 mg Oral BH-q7a  . metoprolol succinate  100 mg Oral BH-q7a  . piperacillin-tazobactam (ZOSYN)  IV  3.375 g Intravenous 3 times per day  . predniSONE  10 mg Oral Daily  . silver sulfADIAZINE   Topical Daily  . vancomycin  1,000 mg Intravenous Q12H  . warfarin  4 mg Oral q1800  . Warfarin - Pharmacist Dosing Inpatient   Does not apply q1800    Assessment: Pt is a 82 year old male with a PMH of chronic afib who presents in Afib RVR. INR on admission is subtherapeutic 1.6. Pharmacy consulted to dose heparin drip while INR is subtherapeutic. Patient currently receiving heparin at 1450 units/hr.    Goal of Therapy:  Heparin level 0.3-0.7 units/ml Monitor platelets by anticoagulation  protocol: Yes   Plan:  Heparin level therapeutic. Continue current rate. Will recheck heparin level and CBC with AM labs.  Laural Benes, Pharm.D., BCPS Clinical Pharmacist 05/03/2015,7:09 AM

## 2015-05-03 NOTE — Consult Note (Signed)
ANTICOAGULATION CONSULT NOTE - Initial Consult  Pharmacy Consult for warfarin Indication: atrial fibrillation  Allergies  Allergen Reactions  . Latex Rash    Patient Measurements: Height: 6\' 1"  (185.4 cm) Weight: 217 lb 2.5 oz (98.5 kg) IBW/kg (Calculated) : 79.9 Heparin Dosing Weight:   Vital Signs: Temp: 98.2 F (36.8 C) (02/28 0800) Temp Source: Oral (02/28 0800) BP: 102/68 mmHg (02/28 1100) Pulse Rate: 82 (02/28 1100)  Labs:  Recent Labs  05/01/15 1633 05/02/15 0614 05/02/15 1415 05/03/15 0625  HGB 13.5 12.1*  --  11.5*  HCT 39.5* 35.5*  --  32.8*  PLT 116* 96*  --  97*  APTT 49*  --   --   --   LABPROT 19.3* 22.2*  --  29.2*  INR 1.62 1.96  --  2.82  HEPARINUNFRC  --  0.57 0.67 0.59  CREATININE 1.12 0.87  --  1.15  TROPONINI 0.03  --   --   --     Estimated Creatinine Clearance: 60.1 mL/min (by C-G formula based on Cr of 1.15).   Medical History: Past Medical History  Diagnosis Date  . COPD (chronic obstructive pulmonary disease) ([place])   . Cancer [facility])     Prostate with bone mets-current chemo treatment  . Hyperlipidemia   . Atrial fibrillation (HCC)     Medications:  Scheduled:  . amiodarone  200 mg Oral Daily  . antiseptic oral rinse  7 mL Mouth Rinse BID  . atorvastatin  80 mg Oral QPM  . calcium-vitamin D  2 tablet Oral BID  . diltiazem  360 mg Oral BH-q7a  . magnesium hydroxide  30 mL Oral BID  . metoprolol succinate  100 mg Oral BH-q7a  . piperacillin-tazobactam (ZOSYN)  IV  3.375 g Intravenous 3 times per day  . polyethylene glycol  17 g Oral BID  . predniSONE  10 mg Oral Daily  . senna-docusate  1 tablet Oral BID  . silver sulfADIAZINE   Topical Daily  . [START ON 05/04/2015] vancomycin  1,000 mg Intravenous Q18H  . warfarin  2 mg Oral q1800  . Warfarin - Pharmacist Dosing Inpatient   Does not apply q1800    Assessment: Pt is a 82 year old male with history of atrial fibrillation, goal INR 2-3, on warfarin 3mg  daily at home.  Patient previously ordered heparin drip.     Goal of Therapy:  INR 2-3 Monitor platelets by anticoagulation protocol: Yes   Plan:  Patient received warfarin 4mg  daily x 2 days. Will decrease patient to warfarin 1mg  and obtain INR with am labs. Patient is ordered amiodarone and Zosyn. Will need to follow INR daily.    Pharmacy will continue to monitor and adjust per consult.    Donnavin Vandenbrink L 05/03/2015,5:16 PM

## 2015-05-03 NOTE — Progress Notes (Signed)
Pharmacy Antibiotic Note  Chad Kirby is a 82 y.o. male admitted on 05/01/2015 with sepsis/wound infection/cellulitis.  Pharmacy has been consulted for vancomycin/Zosyn dosing.  Plan: Patient ordered vancomycin 1g IV Q12hr. Will decrease dose to 750mg  IV Q12hr for goal trough of 15-20. Will obtain follow up trough as clinically indicated.    Will continue Zosyn EI 3.375g IV Q8hr.    Height: 6\' 1"  (185.4 cm) Weight: 217 lb 2.5 oz (98.5 kg) IBW/kg (Calculated) : 79.9  Temp (24hrs), Avg:98.4 F (36.9 C), Min:98.2 F (36.8 C), Max:98.5 F (36.9 C)   Recent Labs Lab 05/01/15 1633 05/01/15 1634 05/01/15 1914 05/01/15 2249 05/02/15 0152 05/02/15 0614 05/03/15 0625 05/03/15 1044  WBC 1.3*  --   --   --   --  5.0 8.2  --   CREATININE 1.12  --   --   --   --  0.87 1.15  --   LATICACIDVEN  --  2.8* 2.4* 1.5 1.1  --   --   --   VANCOTROUGH  --   --   --   --   --   --   --  20    Estimated Creatinine Clearance: 60.1 mL/min (by C-G formula based on Cr of 1.15).    Allergies  Allergen Reactions  . Latex Rash    Antimicrobials this admission: Cefepime 2/26>>2/26 Vancomycin 2/26 >>  Zosyn 2/26 >>   Dose adjustments this admission: Vancomycin 1g IV Q12hr >> Vancomycin 750mg  IV Q12hr.    Microbiology results: 2/26 BCx x 2: streptococcus x 1 set  2/26 UCx: no growth 2/26 Wound: pending   2/26 MRSA PCR: negative  Pharmacy will continue to monitor and adjust per consult.  Simpson,Michael L 05/03/2015 5:20 PM

## 2015-05-04 ENCOUNTER — Inpatient Hospital Stay: Payer: Medicare Other

## 2015-05-04 LAB — BASIC METABOLIC PANEL
Anion gap: 6 (ref 5–15)
BUN: 23 mg/dL — ABNORMAL HIGH (ref 6–20)
CHLORIDE: 101 mmol/L (ref 101–111)
CO2: 24 mmol/L (ref 22–32)
CREATININE: 1.3 mg/dL — AB (ref 0.61–1.24)
Calcium: 6.6 mg/dL — ABNORMAL LOW (ref 8.9–10.3)
GFR calc non Af Amer: 49 mL/min — ABNORMAL LOW (ref >60)
GFR, EST AFRICAN AMERICAN: 57 mL/min — AB (ref >60)
Glucose, Bld: 111 mg/dL — ABNORMAL HIGH (ref 65–99)
Potassium: 3.4 mmol/L — ABNORMAL LOW (ref 3.5–5.1)
Sodium: 131 mmol/L — ABNORMAL LOW (ref 135–145)

## 2015-05-04 LAB — PROTIME-INR
INR: 3.65
Prothrombin Time: 35.5 seconds — ABNORMAL HIGH (ref 11.4–15.0)

## 2015-05-04 LAB — CBC
HEMATOCRIT: 32.5 % — AB (ref 40.0–52.0)
HEMOGLOBIN: 11.1 g/dL — AB (ref 13.0–18.0)
MCH: 36.7 pg — ABNORMAL HIGH (ref 26.0–34.0)
MCHC: 34.3 g/dL (ref 32.0–36.0)
MCV: 107.1 fL — ABNORMAL HIGH (ref 80.0–100.0)
Platelets: 102 10*3/uL — ABNORMAL LOW (ref 150–440)
RBC: 3.04 MIL/uL — ABNORMAL LOW (ref 4.40–5.90)
RDW: 14.8 % — ABNORMAL HIGH (ref 11.5–14.5)
WBC: 9 10*3/uL (ref 3.8–10.6)

## 2015-05-04 MED ORDER — IOHEXOL 240 MG/ML SOLN
25.0000 mL | INTRAMUSCULAR | Status: AC
  Administered 2015-05-04: 50 mL via ORAL
  Administered 2015-05-04: 25 mL via ORAL

## 2015-05-04 MED ORDER — SENNOSIDES-DOCUSATE SODIUM 8.6-50 MG PO TABS
2.0000 | ORAL_TABLET | Freq: Two times a day (BID) | ORAL | Status: DC
  Administered 2015-05-04 – 2015-05-06 (×4): 2 via ORAL
  Filled 2015-05-04 (×4): qty 2

## 2015-05-04 NOTE — Progress Notes (Signed)
Per Dr. Dahlia Byes. Ok to hold off on NG at this time. Chad Kirby E 6:55 PM 05/04/2015

## 2015-05-04 NOTE — Progress Notes (Signed)
Patient with abdominal distention and unable to have a bowel movement for several days KUB reviewed and there is evidence of large bowel distention cecum is less than 10 cm and there is air in the sigmoid Patient does not complain of any abdominal pain or vomiting  PE no acute distress debilitated Abdomen: Distended decreased bowel sounds , soft and nontender  A/P Ogilvie syndrome (colonic pseudoobstruction) active from hospitalization multiple comorbidities and prolonged hospital stay Recommendations: Keep nothing by mouth, CT scan of the abdomen and pelvis with by mouth contrast only to rule out a mechanical large bowel obstruction. Avoid any enemas as this may cause a perforation No need for any surgical intervention or colonic decompression at this time He does not improve and there is no evidence of mechanical obstruction may consider neostigmine We will continue to follow

## 2015-05-04 NOTE — Progress Notes (Signed)
Pharmacy Antibiotic Note  Chad Kirby is a 82 y.o. male admitted on 05/01/2015 with sepsis/wound infection/cellulitis.  Pharmacy has been consulted for vancomycin/Zosyn dosing. Vancomycin d/c 03/01.   Plan: Will continue Zosyn EI 3.375g IV Q8hr.    Height: 6\' 1"  (185.4 cm) Weight: 217 lb 2.5 oz (98.5 kg) IBW/kg (Calculated) : 79.9  Temp (24hrs), Avg:98.4 F (36.9 C), Min:97.8 F (36.6 C), Max:98.8 F (37.1 C)   Recent Labs Lab 05/01/15 1633 05/01/15 1634 05/01/15 1914 05/01/15 2249 05/02/15 0152 05/02/15 0614 05/03/15 0625 05/03/15 1044 05/04/15 0416  WBC 1.3*  --   --   --   --  5.0 8.2  --  9.0  CREATININE 1.12  --   --   --   --  0.87 1.15  --  1.30*  LATICACIDVEN  --  2.8* 2.4* 1.5 1.1  --   --   --   --   VANCOTROUGH  --   --   --   --   --   --   --  20  --     Estimated Creatinine Clearance: 53.2 mL/min (by C-G formula based on Cr of 1.3).    Allergies  Allergen Reactions  . Latex Rash    Antimicrobials this admission: Cefepime 2/26>>2/26 Vancomycin 2/26 >> 03/01 Zosyn 2/26 >>   Dose adjustments this admission: Vancomycin 1g IV Q12hr >> Vancomycin 750mg  IV Q12hr.    Microbiology results: 2/26 BCx x 2: streptococcus x 1 set- likely contamination 2/26 UCx: multiple species 2/26 Wound: normal skin flora  2/26 MRSA PCR: negative  Pharmacy will continue to monitor and adjust per consult.  Napoleon Form 05/04/2015 3:34 PM

## 2015-05-04 NOTE — Progress Notes (Signed)
Soaps suds enema completed. Pt placed on bed pan and pt had brown colored water return. No formed stool noted. Viviana Trimble E 11:39 AM 05/04/2015

## 2015-05-04 NOTE — Progress Notes (Signed)
Pt lying in bed awake and alert no c/o pain or distress noted. pt has family at bedside . Pt is being transferred to floor rm 241 report has been called awaiting transport.

## 2015-05-04 NOTE — Progress Notes (Signed)
Patient arrived from CCU to 2A Room 241. Patient denies pain and all questions answered. Patient oriented to unit and Fall Safety Plan signed. Skin assessment completed with Apolonio Schneiders RN; gauze wrapped on laceration on left leg noted prior to admission and skin otherwise intact. A&Ox4 and A-fib on tele #[id number]. Daughter-in-Law at bedside. Nursing staff will continue to monitor. Earleen Reaper, RN

## 2015-05-04 NOTE — Progress Notes (Signed)
[place] NOTE  Pharmacy Consult for constipation prevention     Allergies  Allergen Reactions  . Latex Rash    Patient Measurements: Height: 6\' 1"  (185.4 cm) Weight: 217 lb 2.5 oz (98.5 kg) IBW/kg (Calculated) : 79.9  Vital Signs: Temp: 98.4 F (36.9 C) (03/01 1200) Temp Source: Oral (03/01 1200) BP: 113/74 mmHg (03/01 1300) Pulse Rate: 100 (03/01 1300) Intake/Output from previous day: 02/28 0701 - 03/01 0700 In: 2220 [P.O.:120; I.V.:1800; IV Piggyback:300] Out: 950 [Urine:950] Intake/Output from this shift: Total I/O In: 600 [I.V.:450; IV Piggyback:150] Out: 225 [Urine:225] Vent settings for last 24 hours:    Labs:  Recent Labs  05/01/15 1633 05/02/15 0614 05/03/15 0625 05/04/15 0416  WBC 1.3* 5.0 8.2 9.0  HGB 13.5 12.1* 11.5* 11.1*  HCT 39.5* 35.5* 32.8* 32.5*  PLT 116* 96* 97* 102*  APTT 49*  --   --   --   INR 1.62 1.96 2.82 3.65  CREATININE 1.12 0.87 1.15 1.30*  MG 1.8  --   --   --   ALBUMIN 2.8* 2.2* 2.2*  --   PROT 6.3* 5.3* 5.5*  --   AST 36 23 27  --   ALT 28 23 23   --   ALKPHOS 122 77 94  --   BILITOT 2.0* 2.9* 1.2  --    Estimated Creatinine Clearance: 53.2 mL/min (by C-G formula based on Cr of 1.3).   Recent Labs  05/01/15 1956  GLUCAP 116*    Microbiology: Recent Results (from the past 720 hour(s))  Urine culture     Status: None   Collection Time: 05/01/15  4:17 PM  Result Value Ref Range Status   Specimen Description URINE, RANDOM  Final   Special Requests NONE  Final   Culture MULTIPLE SPECIES PRESENT, SUGGEST RECOLLECTION  Final   Report Status 05/03/2015 FINAL  Final  Rapid Influenza A&B Antigens ([facility] only)     Status: None   Collection Time: 05/01/15  4:32 PM  Result Value Ref Range Status   Influenza A ([place]) NOT DETECTED  Final   Influenza B ([facility]) NOT DETECTED  Final  Blood culture (routine x 2)     Status: None (Preliminary result)   Collection Time: 05/01/15  4:34 PM  Result Value Ref  Range Status   Specimen Description BLOOD LEFT FOREARM  Final   Special Requests BOTTLES DRAWN AEROBIC AND ANAEROBIC  1CC  Final   Culture NO GROWTH 3 DAYS  Final   Report Status PENDING  Incomplete  Blood culture (routine x 2)     Status: None (Preliminary result)   Collection Time: 05/01/15  4:34 PM  Result Value Ref Range Status   Specimen Description BLOOD RIGHT FOREARM  Final   Special Requests BOTTLES DRAWN AEROBIC AND ANAEROBIC  1CC  Final   Culture  Setup Time   Final    GRAM POSITIVE COCCI AEROBIC BOTTLE ONLY CRITICAL RESULT CALLED TO, READ BACK BY AND VERIFIED WITH: HENRY ZOMPA 05/02/15 1030AM MLM    Culture   Final    VIRIDANS STREPTOCOCCUS AEROBIC BOTTLE ONLY Results consistent with contamination.    Report Status PENDING  Incomplete  Blood Culture ID Panel (Reflexed)     Status: Abnormal   Collection Time: 05/01/15  4:34 PM  Result Value Ref Range Status   Enterococcus species NOT DETECTED NOT DETECTED Final   Vancomycin resistance NOT DETECTED NOT DETECTED Final   Listeria monocytogenes NOT DETECTED NOT DETECTED Final  Staphylococcus species NOT DETECTED NOT DETECTED Final   Staphylococcus aureus NOT DETECTED NOT DETECTED Final   Methicillin resistance NOT DETECTED NOT DETECTED Final   Streptococcus species DETECTED (A) NOT DETECTED Final    Comment: CRITICAL RESULT CALLED TO, READ BACK BY AND VERIFIED WITH: HENRY ZOMPA 05/02/15 1030AM MLM    Streptococcus agalactiae NOT DETECTED NOT DETECTED Final   Streptococcus pneumoniae NOT DETECTED NOT DETECTED Final   Streptococcus pyogenes NOT DETECTED NOT DETECTED Final   Acinetobacter baumannii NOT DETECTED NOT DETECTED Final   Enterobacteriaceae species NOT DETECTED NOT DETECTED Final   Enterobacter cloacae complex NOT DETECTED NOT DETECTED Final   Escherichia coli NOT DETECTED NOT DETECTED Final   Klebsiella oxytoca NOT DETECTED NOT DETECTED Final   Klebsiella pneumoniae NOT DETECTED NOT DETECTED Final   Proteus  species NOT DETECTED NOT DETECTED Final   Serratia marcescens NOT DETECTED NOT DETECTED Final   Carbapenem resistance NOT DETECTED NOT DETECTED Final   Haemophilus influenzae NOT DETECTED NOT DETECTED Final   Neisseria meningitidis NOT DETECTED NOT DETECTED Final   Pseudomonas aeruginosa NOT DETECTED NOT DETECTED Final   Candida albicans NOT DETECTED NOT DETECTED Final   Candida glabrata NOT DETECTED NOT DETECTED Final   Candida krusei NOT DETECTED NOT DETECTED Final   Candida parapsilosis NOT DETECTED NOT DETECTED Final   Candida tropicalis NOT DETECTED NOT DETECTED Final  MRSA PCR Screening     Status: None   Collection Time: 05/01/15  8:00 PM  Result Value Ref Range Status   MRSA by PCR NEGATIVE NEGATIVE Final    Comment:        The GeneXpert MRSA Assay (FDA approved for NASAL specimens only), is one component of a comprehensive MRSA colonization surveillance program. It is not intended to diagnose MRSA infection nor to guide or monitor treatment for MRSA infections.   Wound culture     Status: None (Preliminary result)   Collection Time: 05/02/15  1:10 PM  Result Value Ref Range Status   Specimen Description WOUND  Final   Special Requests Immunocompromised  Final   Gram Stain   Final    RARE WBC SEEN FEW GRAM POSITIVE COCCI FEW GRAM NEGATIVE RODS    Culture NORMAL SKIN FLORA  Final   Report Status PENDING  Incomplete    Medications:  Scheduled:  . amiodarone  200 mg Oral Daily  . antiseptic oral rinse  7 mL Mouth Rinse BID  . atorvastatin  80 mg Oral QPM  . calcium-vitamin D  2 tablet Oral BID  . diltiazem  360 mg Oral BH-q7a  . gabapentin  300 mg Oral BID  . metoprolol succinate  100 mg Oral BH-q7a  . piperacillin-tazobactam (ZOSYN)  IV  3.375 g Intravenous 3 times per day  . polyethylene glycol  17 g Oral BID  . predniSONE  10 mg Oral Daily  . senna-docusate  1 tablet Oral BID  . silver sulfADIAZINE   Topical Daily  . Warfarin - Pharmacist Dosing Inpatient    Does not apply q1800   Infusions:  . sodium chloride 75 mL/hr at 05/03/15 2252  . amiodarone Stopped (05/03/15 1429)  . diltiazem (CARDIZEM) infusion Stopped (05/02/15 1220)  . norepinephrine (LEVOPHED) Adult infusion Stopped (05/02/15 [id number])    Assessment: Pharmacy consulted for constipation management for 82 yo male admitted with sepsis/cellulitis/wound infection.    Plan:  Patient ordered Miralax BID. Will increase docusate/senna 2 tab BID.   Pharmacy will continue to monitor and adjust per consult.  Ulice Dash D 05/04/2015,3:35 PM

## 2015-05-04 NOTE — Consult Note (Signed)
ANTICOAGULATION CONSULT NOTE - Initial Consult  Pharmacy Consult for warfarin Indication: atrial fibrillation  Allergies  Allergen Reactions  . Latex Rash    Patient Measurements: Height: 6\' 1"  (185.4 cm) Weight: 217 lb 2.5 oz (98.5 kg) IBW/kg (Calculated) : 79.9 Heparin Dosing Weight:   Vital Signs: Temp: 98.4 F (36.9 C) (03/01 1200) Temp Source: Oral (03/01 1200) BP: 113/74 mmHg (03/01 1300) Pulse Rate: 100 (03/01 1300)  Labs:  Recent Labs  05/01/15 1633 05/02/15 0614 05/02/15 1415 05/03/15 0625 05/04/15 0416  HGB 13.5 12.1*  --  11.5* 11.1*  HCT 39.5* 35.5*  --  32.8* 32.5*  PLT 116* 96*  --  97* 102*  APTT 49*  --   --   --   --   LABPROT 19.3* 22.2*  --  29.2* 35.5*  INR 1.62 1.96  --  2.82 3.65  HEPARINUNFRC  --  0.57 0.67 0.59  --   CREATININE 1.12 0.87  --  1.15 1.30*  TROPONINI 0.03  --   --   --   --     Estimated Creatinine Clearance: 53.2 mL/min (by C-G formula based on Cr of 1.3).   Medical History: Past Medical History  Diagnosis Date  . COPD (chronic obstructive pulmonary disease) ([place])   . Cancer [facility])     Prostate with bone mets-current chemo treatment  . Hyperlipidemia   . Atrial fibrillation (HCC)     Medications:  Scheduled:  . amiodarone  200 mg Oral Daily  . antiseptic oral rinse  7 mL Mouth Rinse BID  . atorvastatin  80 mg Oral QPM  . calcium-vitamin D  2 tablet Oral BID  . diltiazem  360 mg Oral BH-q7a  . gabapentin  300 mg Oral BID  . metoprolol succinate  100 mg Oral BH-q7a  . piperacillin-tazobactam (ZOSYN)  IV  3.375 g Intravenous 3 times per day  . polyethylene glycol  17 g Oral BID  . predniSONE  10 mg Oral Daily  . senna-docusate  1 tablet Oral BID  . silver sulfADIAZINE   Topical Daily  . Warfarin - Pharmacist Dosing Inpatient   Does not apply q1800    Assessment: Pt is a 82 year old male with history of atrial fibrillation, goal INR 2-3, on warfarin 3mg  daily at home. Patient previously ordered heparin drip.      Goal of Therapy:  INR 2-3 Monitor platelets by anticoagulation protocol: Yes   Plan:  INR is supratherapeutic. Will hold warfarin and f/u AM INR. Patient is ordered amiodarone and Zosyn. Will need to follow INR daily.    Pharmacy will continue to monitor and adjust per consult.    Ulice Dash D 05/04/2015,3:30 PM

## 2015-05-04 NOTE — Progress Notes (Signed)
CC:  Subjective: Cellulitis leg  Objective: Vital signs in last 24 hours: Temp:  [97.8 F (36.6 C)-98.9 F (37.2 C)] 98.1 F (36.7 C) (03/01 0800) Pulse Rate:  [42-121] 102 (03/01 0900) Resp:  [14-42] 21 (03/01 0900) BP: (83-117)/(59-81) 99/75 mmHg (03/01 0900) SpO2:  [87 %-96 %] 94 % (03/01 0900)    Intake/Output from previous day: 02/28 0701 - 03/01 0700 In: 2220 [P.O.:120; I.V.:1800; IV Piggyback:300] Out: 950 [Urine:950] Intake/Output this shift:    Physical exam: NAD awake alert Abd: soft , NT Ext: there is difficult improvement of cellulitis. There is mid left lateral wound with some sloughing of skin but no evidence of necrotizing infection no evidence of an abscess.. Silvadene was applied Neuro alert, GCS of 15 no motor deficits  Lab Results: CBC   Recent Labs  05/03/15 0625 05/04/15 0416  WBC 8.2 9.0  HGB 11.5* 11.1*  HCT 32.8* 32.5*  PLT 97* 102*   BMET  Recent Labs  05/03/15 0625 05/04/15 0416  NA 132* 131*  K 3.5 3.4*  CL 103 101  CO2 21* 24  GLUCOSE 118* 111*  BUN 25* 23*  CREATININE 1.15 1.30*  CALCIUM 6.5* 6.6*   PT/INR  Recent Labs  05/03/15 0625 05/04/15 0416  LABPROT 29.2* 35.5*  INR 2.82 3.65   ABG No results for input(s): PHART, HCO3 in the last 72 hours.  Invalid input(s): PCO2, PO2  Studies/Results: Dg Chest Port 1 View  05/02/2015  CLINICAL DATA:  82 year old male with PICC line placement. EXAM: PORTABLE CHEST 1 VIEW COMPARISON:  Radiograph dated 05/02/2015 FINDINGS: A right-sided PICC is noted with tip over central SVC. Single-view the chest demonstrate mild eventration of the left hemidiaphragm with associated minimal subsegmental atelectatic changes of the left lung base. There is no consolidation, pleural effusion, or pneumothorax. Stable cardiac silhouette. The osseous structures appear unremarkable. IMPRESSION: Right-sided PICC over central SVC. Electronically Signed   By: Anner Crete M.D.   On: 05/02/2015 18:41    Dg Chest Port 1 View  05/02/2015  CLINICAL DATA:  Central line placement. History of prostate carcinoma with bone Mets. EXAM: PORTABLE CHEST 1 VIEW COMPARISON:  05/01/2015 FINDINGS: There is no evidence of a central line on the included field of view. There is no pneumothorax. Mild enlargement of the cardiac silhouette. No mediastinal or hilar masses or convincing adenopathy. There is opacity at the left lung base similar to the prior study likely chronic atelectasis or scarring. Lungs otherwise clear. No pleural effusion. Bony thorax is demineralized but grossly intact. IMPRESSION: 1. No evidence of a central line on the included field of view. 2. No pneumothorax.  No acute cardiopulmonary disease. Electronically Signed   By: Lajean Manes M.D.   On: 05/02/2015 16:12    Anti-infectives: Anti-infectives    Start     Dose/Rate Route Frequency Ordered Stop   05/04/15 0549  vancomycin (VANCOCIN) IVPB 1000 mg/200 mL premix  Status:  Discontinued     1,000 mg 200 mL/hr over 60 Minutes Intravenous Every 18 hours 05/03/15 1713 05/03/15 1733   05/03/15 2200  vancomycin (VANCOCIN) IVPB 750 mg/150 ml premix     750 mg 150 mL/hr over 60 Minutes Intravenous Every 12 hours 05/03/15 1733     05/01/15 2330  vancomycin (VANCOCIN) IVPB 1000 mg/200 mL premix  Status:  Discontinued     1,000 mg 200 mL/hr over 60 Minutes Intravenous Every 12 hours 05/01/15 2033 05/03/15 1713   05/01/15 2200  piperacillin-tazobactam (ZOSYN) IVPB 3.375 g  3.375 g 12.5 mL/hr over 240 Minutes Intravenous 3 times per day 05/01/15 2033     05/01/15 1915  piperacillin-tazobactam (ZOSYN) IVPB 3.375 g  Status:  Discontinued     3.375 g 100 mL/hr over 30 Minutes Intravenous  Once 05/01/15 1911 05/01/15 1923   05/01/15 1700  ceFEPIme (MAXIPIME) 1 g in dextrose 5 % 50 mL IVPB  Status:  Discontinued     1 g 100 mL/hr over 30 Minutes Intravenous  Once 05/01/15 1653 05/01/15 1658   05/01/15 1700  ceFEPIme (MAXIPIME) 2 g in dextrose 5 %  50 mL IVPB     2 g 100 mL/hr over 30 Minutes Intravenous  Once 05/01/15 1658 05/01/15 1840   05/01/15 1645  vancomycin (VANCOCIN) IVPB 1000 mg/200 mL premix     1,000 mg 200 mL/hr over 60 Minutes Intravenous  Once 05/01/15 1646 05/01/15 1840      Assessment/Plan: Cellulitis of the left leg with a small open wound and should respond to local wound care. Recommend continuation of the Silvadene no need for surgical intervention or debridement. Discussed with primary doctor and the family in detail. I may see him as an outpatient in 2 weeks   Caroleen Hamman, MD, [facility]  05/04/2015

## 2015-05-04 NOTE — Evaluation (Signed)
Physical Therapy Evaluation Patient Details Name: Chad Kirby MRN: [id number] DOB: [birth year] Today's Date: 05/04/2015   History of Present Illness  Pt is a 82 y.o. male with PMH of COPD, A-Fib, metastasis and chemotherapy.  Pt presented with weakness, L lower leg infection, fever and chills.  Pt admitted for cellulitis and A-Fib with RVR (05-01-15).    Clinical Impression  Prior to admission pt was independent.  Pt lives with wife in two story home, with bedroom on second floor.  However, pt reports that he can sleep on the first floor. Pt required min assist x2 for bed mobility.  Pt's vitals at rest were O2 saturation: 94% O2 (3 L/min O2 via nasal cannula), HR: 94 bpm and blood pressure: 106/69.  Pt then sat EOB for 10 minutes.  Pt complained of nausea and fatigue during session.  Pt then attempted a 2 hand hold sit to stand with mod assist x2.  Pt could not fully stand erect.  Pt's vitals after attempted sit to stand were O2 saturation: 98% O2 (3 L/min O2 via nasal cannula), HR: 93 bpm and blood pressure: 148/34.  Pt was then put back in bed min assist x2.  Due to aforementioned function and strength deficits, pt is in need of skilled physical therapy.  It is recommended that pt is discharged to SNF when medically appropriate.     Follow Up Recommendations SNF    Equipment Recommendations  Rolling walker with 5" wheels    Recommendations for Other Services       Precautions / Restrictions Precautions Precautions: Fall Restrictions Weight Bearing Restrictions: No      Mobility  Bed Mobility Overal bed mobility: Needs Assistance Bed Mobility: Rolling;Sidelying to Sit;Sit to Sidelying Rolling: Min assistx2 Sidelying to sit: Min assistx2     Sit to sidelying: Min assistx2 General bed mobility comments: increased time, vc's and tactile cues for hand placement.Assistance needed with trunk and L LE.  Transfers Overall transfer level: Needs assistance Equipment used: 2 person  hand held assist Transfers: Sit to/from Stand Sit to Stand: Mod assist;+2 physical assistance         General transfer comment: increased time.  Did not fully stand   Ambulation/Gait                Stairs            Wheelchair Mobility    Modified Rankin (Stroke Patients Only)       Balance Overall balance assessment: Needs assistance Sitting-balance support: Feet supported;Single extremity supported (bed rail ) Sitting balance-Leahy Scale: Fair Sitting balance - Comments: sat for 10 minutes EOB.                                     Pertinent Vitals/Pain Pain Assessment: No/denies pain  See flow sheet for vitals.     Home Living Family/patient expects to be discharged to:: Private residence Living Arrangements: Spouse/significant other Available Help at Discharge: Family Type of Home: House Home Access: Stairs to enter Entrance Stairs-Rails: Can reach both Entrance Stairs-Number of Steps: 7 Home Layout: Two level (Can sleep on first level) Home Equipment: None      Prior Function Level of Independence: Independent               Hand Dominance        Extremity/Trunk Assessment   Upper Extremity Assessment: Generalized weakness  Lower Extremity Assessment: Generalized weakness      Cervical / Trunk Assessment: Normal  Communication   Communication: No difficulties  Cognition Arousal/Alertness: Awake/alert Behavior During Therapy: WFL for tasks assessed/performed Overall Cognitive Status: Within Functional Limits for tasks assessed                      General Comments   Nursing was contacted and cleared pt for physical therapy.  Pt was agreeable and session was modified due to fatigue.  Upon sitting on EOB pt was observed to be bleeding from R elbow. Nursing notified immediately and came to assess.  At end of session, pt gave consent to inform his wife of session.  Wife and other family were  present at end of session.      Exercises        Assessment/Plan    PT Assessment Patient needs continued PT services  PT Diagnosis Generalized weakness   PT Problem List Decreased strength;Decreased range of motion;Decreased activity tolerance;Decreased balance;Decreased mobility;Decreased coordination  PT Treatment Interventions DME instruction;Gait training;Stair training;Functional mobility training;Therapeutic activities;Therapeutic exercise;Patient/family education;Balance training   PT Goals (Current goals can be found in the Care Plan section) Acute Rehab PT Goals Patient Stated Goal: to go home  PT Goal Formulation: With patient Time For Goal Achievement: 05/18/15 Potential to Achieve Goals: Fair    Frequency Min 2X/week   Barriers to discharge        Co-evaluation               End of Session Equipment Utilized During Treatment: Gait belt;Oxygen (3 L/min) Activity Tolerance: Patient limited by fatigue Patient left: in bed;with call bell/phone within reach;with family/visitor present Nurse Communication: Mobility status         Time: [id number] PT Time Calculation (min) (ACUTE ONLY): 43 min   Charges:         PT G Codes:       Mittie Bodo, SPT Mittie Bodo 05/04/2015, 4:08 PM

## 2015-05-05 LAB — PROTIME-INR
INR: 3.48
PROTHROMBIN TIME: 34.2 s — AB (ref 11.4–15.0)

## 2015-05-05 MED ORDER — SODIUM CHLORIDE 0.9% FLUSH: 10.0000 mL | INTRAVENOUS | Status: DC | PRN

## 2015-05-05 MED ORDER — SODIUM CHLORIDE 0.9% FLUSH
10.0000 mL | Freq: Two times a day (BID) | INTRAVENOUS | Status: DC
  Administered 2015-05-05: 30 mL
  Administered 2015-05-05 – 2015-05-06 (×3): 10 mL

## 2015-05-05 MED ORDER — AMOXICILLIN-POT CLAVULANATE 500-125 MG PO TABS
1.0000 | ORAL_TABLET | Freq: Three times a day (TID) | ORAL | Status: DC
  Administered 2015-05-05 – 2015-05-06 (×4): 500 mg via ORAL
  Filled 2015-05-05 (×6): qty 1

## 2015-05-05 NOTE — Consult Note (Addendum)
ANTICOAGULATION CONSULT NOTE - Follow Up Consult  Pharmacy Consult for warfarin Indication: atrial fibrillation  Allergies  Allergen Reactions  . Latex Rash    Patient Measurements: Height: 6\' 1"  (185.4 cm) Weight: 228 lb (103.42 kg) IBW/kg (Calculated) : 79.9  Vital Signs: Temp: 98.2 F (36.8 C) (03/02 1158) Temp Source: Oral (03/02 1158) BP: 101/64 mmHg (03/02 1158) Pulse Rate: 90 (03/02 1158)  Labs:  Recent Labs  05/03/15 0625 05/04/15 0416 05/05/15 0555  HGB 11.5* 11.1*  --   HCT 32.8* 32.5*  --   PLT 97* 102*  --   LABPROT 29.2* 35.5* 34.2*  INR 2.82 3.65 3.48  HEPARINUNFRC 0.59  --   --   CREATININE 1.15 1.30*  --     Estimated Creatinine Clearance: 54.4 mL/min (by C-G formula based on Cr of 1.3).   Medical History: Past Medical History  Diagnosis Date  . COPD (chronic obstructive pulmonary disease) ([place])   . Cancer [facility])     Prostate with bone mets-current chemo treatment  . Hyperlipidemia   . Atrial fibrillation (H[facility])     Medications:  Scheduled:  . amiodarone  200 mg Oral Daily  . amoxicillin-clavulanate  1 tablet Oral TID  . antiseptic oral rinse  7 mL Mouth Rinse BID  . atorvastatin  80 mg Oral QPM  . calcium-vitamin D  2 tablet Oral BID  . diltiazem  360 mg Oral BH-q7a  . gabapentin  300 mg Oral BID  . metoprolol succinate  100 mg Oral BH-q7a  . piperacillin-tazobactam (ZOSYN)  IV  3.375 g Intravenous 3 times per day  . polyethylene glycol  17 g Oral BID  . predniSONE  10 mg Oral Daily  . senna-docusate  2 tablet Oral BID  . silver sulfADIAZINE   Topical Daily  . sodium chloride flush  10-40 mL Intracatheter Q12H  . Warfarin - Pharmacist Dosing Inpatient   Does not apply q1800    Assessment: Pt is a 82 year old male with history of atrial fibrillation, goal INR 2-3, on warfarin 3mg  daily at home. Patient previously ordered heparin drip.    INR: 3.48   Goal of Therapy:  INR 2-3 Monitor platelets by anticoagulation protocol:  Yes   Plan:  INR is supratherapeutic. Of note, patient started on Augmentin today. Will continue to hold today's dose and recheck INR in AM.   Pharmacy will continue to monitor and adjust per consult.    Kimeka Badour G 05/05/2015,3:20 PM

## 2015-05-05 NOTE — NC FL2 (Signed)
[place] LEVEL OF CARE SCREENING TOOL     IDENTIFICATION  Patient Name: Chad Kirby Birthdate: [birth year] Sex: male Admission Date (Current Location): 05/01/2015  [place] and [place] Number:  Engineering geologist and Address:  [facility], [address], [place], [place] [address]      Provider Number: [id number]  Attending Physician Name and Address:  [name], MD  Relative Name and Phone Number:       Current Level of Care: Hospital Recommended Level of Care: [place] Prior Approval Number:    Date Approved/Denied:   PASRR Number:  ([id number])  Discharge Plan: SNF    Current Diagnoses: Patient Active Problem List   Diagnosis Date Noted  . Cellulitis of left lower extremity   . Atrial fibrillation with RVR ([place]) 05/01/2015  . Sepsis ([place]) 05/01/2015  . Cellulitis and abscess of leg 05/01/2015    Orientation RESPIRATION BLADDER Height & Weight     Self, Time, Situation, Place  O2 (3 Liters Oxygen ) Continent Weight: 228 lb (103.42 kg) Height:  6\' 1"  (185.4 cm)  BEHAVIORAL SYMPTOMS/MOOD NEUROLOGICAL BOWEL NUTRITION STATUS   (none )  (none ) Continent Diet (Diet: Full Liquid )  AMBULATORY STATUS COMMUNICATION OF NEEDS Skin   Extensive Assist Verbally Other (Comment) (Laceration Left Leg )                       Personal Care Assistance Level of Assistance  Bathing, Feeding, Dressing Bathing Assistance: Limited assistance Feeding assistance: Independent Dressing Assistance: Limited assistance     Functional Limitations Info  Sight, Speech, Hearing Sight Info: Adequate Hearing Info: Adequate Speech Info: Adequate    SPECIAL CARE FACTORS FREQUENCY  PT (By licensed PT)     PT Frequency:  (5)              Contractures      Additional Factors Info  Code Status, Allergies Code Status Info:  (Full Code. ) Allergies Info:  (Latex)           Current Medications (05/05/2015):   This is the current hospital active medication list Current Facility-Administered Medications  Medication Dose Route Frequency Provider Last Rate Last Dose  . 0.9 %  sodium chloride infusion   Intravenous Continuous Demetrios Loll, MD   Stopped at 05/05/15 1339  . acetaminophen (TYLENOL) tablet 650 mg  650 mg Oral Q6H PRN Demetrios Loll, MD       Or  . acetaminophen (TYLENOL) suppository 650 mg  650 mg Rectal Q6H PRN Demetrios Loll, MD      . albuterol (PROVENTIL) (2.5 MG/3ML) 0.083% nebulizer solution 2.5 mg  2.5 mg Nebulization Q2H PRN Demetrios Loll, MD   2.5 mg at 05/05/15 1027  . amiodarone (NEXTERONE PREMIX) 360 MG/200ML (1.8 mg/mL) IV infusion  30 mg/hr Intravenous Continuous [name], MD   Stopped at 05/03/15 1429  . amiodarone (PACERONE) tablet 200 mg  200 mg Oral Daily [name], MD   200 mg at 05/05/15 0951  . amoxicillin-clavulanate (AUGMENTIN) 500-125 MG per tablet 500 mg  1 tablet Oral TID [name], MD      . antiseptic oral rinse (CPC / CETYLPYRIDINIUM CHLORIDE 0.05%) solution 7 mL  7 mL Mouth Rinse BID [name], MD   7 mL at 05/05/15 1000  . atorvastatin (LIPITOR) tablet 80 mg  80 mg Oral QPM Demetrios Loll, MD   80 mg at 05/03/15 1709  . calcium-vitamin D (  OSCAL WITH D) 500-200 MG-UNIT per tablet 2 tablet  2 tablet Oral BID [name], MD   2 tablet at 05/05/15 0951  . diltiazem (CARDIZEM CD) 24 hr capsule 360 mg  360 mg Oral Beaulah Dinning, MD   360 mg at 05/05/15 0603  . diltiazem (CARDIZEM) 100 mg in dextrose 5 % 100 mL (1 mg/mL) infusion  5-15 mg/hr Intravenous Titrated Joanne Gavel, MD   Stopped at 05/02/15 1220  . gabapentin (NEURONTIN) capsule 300 mg  300 mg Oral BID [name], MD   300 mg at 05/05/15 0951  . HYDROcodone-acetaminophen (NORCO/VICODIN) 5-325 MG per tablet 1 tablet  1 tablet Oral Q4H PRN Demetrios Loll, MD   1 tablet at 05/04/15 2213  . metoprolol succinate (TOPROL-XL) 24 hr tablet 100 mg  100 mg Oral Beaulah Dinning, MD   100 mg at 05/05/15 0603  . norepinephrine  (LEVOPHED) 4 mg in dextrose 5 % 250 mL (0.016 mg/mL) infusion  0-40 mcg/min Intravenous Titrated [name], MD   Stopped at 05/02/15 [phone]  . ondansetron (ZOFRAN) tablet 4 mg  4 mg Oral Q6H PRN Demetrios Loll, MD   4 mg at 05/03/15 2303   Or  . ondansetron [facility]) injection 4 mg  4 mg Intravenous Q6H PRN Demetrios Loll, MD   4 mg at 05/01/15 2245  . piperacillin-tazobactam (ZOSYN) IVPB 3.375 g  3.375 g Intravenous 3 times per day Demetrios Loll, MD   3.375 g at 05/05/15 1338  . polyethylene glycol (MIRALAX / GLYCOLAX) packet 17 g  17 g Oral BID [name], MD   17 g at 05/05/15 0952  . predniSONE (DELTASONE) tablet 10 mg  10 mg Oral Daily Demetrios Loll, MD   10 mg at 05/05/15 0951  . senna-docusate (Senokot-S) tablet 2 tablet  2 tablet Oral BID [name], MD   2 tablet at 05/05/15 0951  . silver sulfADIAZINE (SILVADENE) 1 % cream   Topical Daily Diego F Pabon, MD      . sodium chloride flush (NS) 0.9 % injection 10-40 mL  10-40 mL Intracatheter Q12H Terel Bann, MD   10 mL at 05/05/15 1000  . sodium chloride flush (NS) 0.9 % injection 10-40 mL  10-40 mL Intracatheter PRN [name], MD      . Warfarin - Pharmacist Dosing Inpatient   Does not apply [id number] Demetrios Loll, MD         Discharge Medications: Please see discharge summary for a list of discharge medications.  Relevant Imaging Results:  Relevant Lab Results:   Additional Information  (SSN: [id number])  Loralyn Freshwater, LCSW

## 2015-05-05 NOTE — Progress Notes (Signed)
[facility] [place] NAME: Chad Kirby    MR#:  [id number]  DATE OF BIRTH:  February 02, 1930  SUBJECTIVE:  CHIEF COMPLAINT:  Patient is feeling better , constipated for 8 days, no bowel movement with laxatives , reporting abdominal discomfort .Mom at bedside  REVIEW OF SYSTEMS:  CONSTITUTIONAL: No fever, reporting fatigue or weakness.  EYES: No blurred or double vision.  EARS, NOSE, AND THROAT: No tinnitus or ear pain.  RESPIRATORY: No cough, shortness of breath, wheezing or hemoptysis.  CARDIOVASCULAR: Intermittent palpitations. No chest pain, orthopnea, edema.  GASTROINTESTINAL: No nausea, vomiting, diarrhea or abdominal pain. Constipated GENITOURINARY: No dysuria, hematuria.  ENDOCRINE: No polyuria, nocturia,  HEMATOLOGY: No anemia, easy bruising or bleeding SKIN: No rash or lesion. Left leg wound getting worse but better after starting antibiotics  MUSCULOSKELETAL: No joint pain or arthritis.   NEUROLOGIC: No tingling, numbness, weakness.  PSYCHIATRY: No anxiety or depression.   DRUG ALLERGIES:   Allergies  Allergen Reactions  . Latex Rash    VITALS:  Blood pressure 112/69, pulse 98, temperature 97.7 F (36.5 C), temperature source Oral, resp. rate 20, height 6\' 1"  (1.854 m), weight 103.42 kg (228 lb), SpO2 95 %.  PHYSICAL EXAMINATION:  GENERAL:  82 y.o.-year-old patient lying in the bed with no acute distress.  EYES: Pupils equal, round, reactive to light and accommodation. No scleral icterus. Extraocular muscles intact.  HEENT: Head atraumatic, normocephalic. Oropharynx and nasopharynx clear.  NECK:  Supple, no jugular venous distention. No thyroid enlargement, no tenderness.  LUNGS: Normal breath sounds bilaterally, no wheezing, rales,rhonchi or crepitation. No use of accessory muscles of respiration.  CARDIOVASCULAR: Irregularly irregular. No murmurs, rubs, or gallops.  ABDOMEN: Soft, nontender, distended.  Hypoactive  Bowel sounds present. No organomegaly or mass.  EXTREMITIES: Left lower extremity-wound with purulent discharge, tender with some edema, erythema is better after starting IV antibiotics. No pedal edema, cyanosis, or clubbing.  NEUROLOGIC: Cranial nerves II through XII are intact. Muscle strength 5/5 in all extremities. Sensation intact. Gait not checked.  PSYCHIATRIC: The patient is alert and oriented x 3.  SKIN: No obvious rash, lesion, or ulcer.    LABORATORY PANEL:   CBC  Recent Labs Lab 05/04/15 0416  WBC 9.0  HGB 11.1*  HCT 32.5*  PLT 102*   ------------------------------------------------------------------------------------------------------------------  Chemistries   Recent Labs Lab 05/01/15 1633  05/03/15 0625 05/04/15 0416  NA 134*  < > 132* 131*  K 4.0  < > 3.5 3.4*  CL 101  < > 103 101  CO2 23  < > 21* 24  GLUCOSE 162*  < > 118* 111*  BUN 20  < > 25* 23*  CREATININE 1.12  < > 1.15 1.30*  CALCIUM 7.7*  < > 6.5* 6.6*  MG 1.8  --   --   --   AST 36  < > 27  --   ALT 28  < > 23  --   ALKPHOS 122  < > 94  --   BILITOT 2.0*  < > 1.2  --   < > = values in this interval not displayed. ------------------------------------------------------------------------------------------------------------------  Cardiac Enzymes  Recent Labs Lab 05/01/15 1633  TROPONINI 0.03   ------------------------------------------------------------------------------------------------------------------  RADIOLOGY:  Ct Abdomen Pelvis Wo Contrast  05/04/2015  CLINICAL DATA:  82 year old male with abdominal distention. No bowel movement for several days. EXAM: CT ABDOMEN AND PELVIS WITHOUT CONTRAST TECHNIQUE: Multidetector CT imaging of the abdomen and pelvis was performed  following the standard protocol without IV contrast. COMPARISON:  No priors. FINDINGS: Lower chest: Heart size is enlarged with biatrial dilatation. Atherosclerotic calcifications are noted in the left main, left anterior  descending, left circumflex and right coronary arteries. Mild calcifications of the aortic valve. Trace bilateral pleural effusions lying dependently. Dependent subsegmental atelectasis in the lower lobes of the lungs bilaterally. Hepatobiliary: No cystic or solid hepatic lesions are identified on today's noncontrast CT examination. Unenhanced appearance of the gallbladder is normal. Pancreas: No pancreatic mass or peripancreatic inflammatory changes on today's noncontrast CT examination. Spleen: Unremarkable. Adrenals/Urinary Tract: Tiny 2-3 mm calcifications in the right renal hilum, may be vascular or may be nonobstructive calculi. Parenchymal calcification in the upper pole of the left kidney is likely dystrophic. Unenhanced appearance of the kidneys is otherwise unremarkable. No ureteral stones. No hydroureteronephrosis. Urinary bladder is completely decompressed around an indwelling Foley catheter. Bilateral adrenal glands are normal in appearance. Stomach/Bowel: Unenhanced appearance of the stomach is normal. No pathologic dilatation of small bowel or colon. Moderate gaseous distention is noted in the colon, which is highly nonspecific. Appendix is not confidently identified may be surgically absent. Regardless, there are no inflammatory changes noted adjacent to the cecum to suggest the presence of an acute appendicitis at this time. Vascular/Lymphatic: Atherosclerotic calcifications are noted throughout the abdominal and pelvic vasculature, without evidence of aneurysm or dissection. No lymphadenopathy noted in the abdomen or pelvis on today's noncontrast CT examination. Reproductive: Prostate gland is diminutive and partially calcified. Seminal vesicles are unremarkable in appearance. Other: No significant volume of ascites.  No pneumoperitoneum. Musculoskeletal: Numerous sclerotic lesions noted throughout the visualized axial and appendicular skeleton, highly concerning for widespread metastatic disease  to the bones. The largest of the lesions are noted with than T11, T12 and L2 vertebral bodies which are nearly completely sclerotic. IMPRESSION: 1. No acute findings in the abdomen or pelvis. Specifically, no signs of bowel obstruction. 2. Widespread sclerotic lesions throughout the visualized axial and appendicular skeleton, highly concerning for metastatic disease to the bones. Statistically, this likely reflects prostate cancer. Correlation with PSA levels and physical examination is recommended. 3. Mild cardiomegaly with biatrial dilatation. 4. Trace bilateral pleural effusions lying dependently. 5. Tiny calcifications associated with DUB right renal hilum may represent 2-3 mm nonobstructive calculi, or may simply be vascular. 6. Additional incidental findings, as above. Electronically Signed   By: Vinnie Langton M.D.   On: 05/04/2015 18:42   Dg Abd 2 Views  05/04/2015  CLINICAL DATA:  Constipation, nausea and vomiting for 3 days, unable to have bowel movement, left leg cellulitis, history of prostate cancer. EXAM: ABDOMEN - 2 VIEW COMPARISON:  03/20/2006 FINDINGS: Mild gaseous distended small bowel loops are noted mid abdomen suspicious for ileus. There is significant gaseous distended right colon transverse colon and proximal left colon. Findings highly suspicious for colonic ileus. Some colonic gas and stool are noted in distal sigmoid colon and rectum. IMPRESSION: Mild gaseous distended small bowel loops are noted mid abdomen suspicious for ileus. There is significant gaseous distended right colon transverse colon and proximal left colon. Findings highly suspicious for colonic ileus. Some colonic gas and stool are noted in distal sigmoid colon and rectum. Electronically Signed   By: Lahoma Crocker M.D.   On: 05/04/2015 13:13    EKG:   Orders placed or performed during the hospital encounter of 05/01/15  . ED EKG  . ED EKG  . EKG 12-Lead  . EKG 12-Lead    ASSESSMENT AND PLAN:  *  Ileus  nothing by  mouth, NG tube and IV fluids Discussed with surgery Dr. Dahlia Byes    *A. fib with RVR-rate controlled now  off amiodarone drip , patient is started on amiodarone 200 mg by mouth once daily   he did not respond much to Cardizem drip and weaned off Cardizem drip discontinueD heparin drip, INR is therapeutic at 2.82--3.65. Hold  Coumadin, monitor PT/INR Normal TSH Appreciate cardiology recommendations Levophed as needed to support the blood pressure to Maintain map at or greater than 65  Transition to by mouth Cardizem and Lopressor if heart rate is controlled.   *Sepsis with left leg cellulitis.  .  significantly improving with antibiotics continue vancomycin and  Zosyn pharmacy to dose. IV fluid support.  Wound culture with gram-positive cocaine gram-negative rods Surgery is not currently considering debridement at this point, continue wound care with Silvadene  *Neutropenia. Resolved Due to chemotherapy/sepsis  Follow-up CBC and oncology consult.  *Thrombocytopenia. Probably heparin-induced -platelet count at 97,000 today Follow-up CBC. History of processes cancer. Follow-up oncology consult.   *Elevated bilirubin. Resolved  abdominal ultrasound with hepatic steatosis  *Deconditioning with physical therapy Transfer patient to telemetry   All the records are reviewed and case discussed with Care Management/Social Workerr. Management plans discussed with the patient, daughter and they are in agreement.  CODE STATUS: fc   TOTAL  CrITICAL  careTIME TAKING CARE OF THIS PATIENT: 41 minutes.  More than 50% time was spent on coordination of care and counseling  POSSIBLE D/C IN 3-4  DAYS, DEPENDING ON CLINICAL CONDITION.   [name] M.D on 05/05/2015 at 9:15 AM  Between 7am to 6pm - Pager - [phone] After 6pm go to [email or web address] - password [email or web address] [facility]  [facility]  Office  [phone]  CC: Primary care physician; No primary care provider on file.

## 2015-05-05 NOTE — Progress Notes (Signed)
CC: ileus, large large bowel pseudobstruction Subjective: Multipel BMs, CT reviewed no evidence of large bowel obstruction, improvemens in colon distension  Objective: Vital signs in last 24 hours: Temp:  [97.7 F (36.5 C)-98.9 F (37.2 C)] 98.2 F (36.8 C) (03/02 1158) Pulse Rate:  [90-103] 90 (03/02 1158) Resp:  [18-26] 20 (03/02 0848) BP: (98-122)/(57-79) 101/64 mmHg (03/02 1158) SpO2:  [92 %-95 %] 92 % (03/02 1158) Weight:  [103.42 kg (228 lb)] 103.42 kg (228 lb) (03/01 2206) Last BM Date: 05/05/15  Intake/Output from previous day: 03/01 0701 - 03/02 0700 In: 1250 [I.V.:1050; IV Piggyback:200] Out: 1125 [Urine:1125] Intake/Output this shift: Total I/O In: 360 [P.O.:360] Out: -   Physical exam: NAD Awake alert Abd: soft, NT, improvement on distension, no peritonitis Neuro: gcs 15 no motor or sensory defictis   Lab Results: CBC   Recent Labs  05/03/15 0625 05/04/15 0416  WBC 8.2 9.0  HGB 11.5* 11.1*  HCT 32.8* 32.5*  PLT 97* 102*   BMET  Recent Labs  05/03/15 0625 05/04/15 0416  NA 132* 131*  K 3.5 3.4*  CL 103 101  CO2 21* 24  GLUCOSE 118* 111*  BUN 25* 23*  CREATININE 1.15 1.30*  CALCIUM 6.5* 6.6*   PT/INR  Recent Labs  05/04/15 0416 05/05/15 0555  LABPROT 35.5* 34.2*  INR 3.65 3.48   ABG No results for input(s): PHART, HCO3 in the last 72 hours.  Invalid input(s): PCO2, PO2  Studies/Results: Ct Abdomen Pelvis Wo Contrast  05/04/2015  CLINICAL DATA:  82 year old male with abdominal distention. No bowel movement for several days. EXAM: CT ABDOMEN AND PELVIS WITHOUT CONTRAST TECHNIQUE: Multidetector CT imaging of the abdomen and pelvis was performed following the standard protocol without IV contrast. COMPARISON:  No priors. FINDINGS: Lower chest: Heart size is enlarged with biatrial dilatation. Atherosclerotic calcifications are noted in the left main, left anterior descending, left circumflex and right coronary arteries. Mild  calcifications of the aortic valve. Trace bilateral pleural effusions lying dependently. Dependent subsegmental atelectasis in the lower lobes of the lungs bilaterally. Hepatobiliary: No cystic or solid hepatic lesions are identified on today's noncontrast CT examination. Unenhanced appearance of the gallbladder is normal. Pancreas: No pancreatic mass or peripancreatic inflammatory changes on today's noncontrast CT examination. Spleen: Unremarkable. Adrenals/Urinary Tract: Tiny 2-3 mm calcifications in the right renal hilum, may be vascular or may be nonobstructive calculi. Parenchymal calcification in the upper pole of the left kidney is likely dystrophic. Unenhanced appearance of the kidneys is otherwise unremarkable. No ureteral stones. No hydroureteronephrosis. Urinary bladder is completely decompressed around an indwelling Foley catheter. Bilateral adrenal glands are normal in appearance. Stomach/Bowel: Unenhanced appearance of the stomach is normal. No pathologic dilatation of small bowel or colon. Moderate gaseous distention is noted in the colon, which is highly nonspecific. Appendix is not confidently identified may be surgically absent. Regardless, there are no inflammatory changes noted adjacent to the cecum to suggest the presence of an acute appendicitis at this time. Vascular/Lymphatic: Atherosclerotic calcifications are noted throughout the abdominal and pelvic vasculature, without evidence of aneurysm or dissection. No lymphadenopathy noted in the abdomen or pelvis on today's noncontrast CT examination. Reproductive: Prostate gland is diminutive and partially calcified. Seminal vesicles are unremarkable in appearance. Other: No significant volume of ascites.  No pneumoperitoneum. Musculoskeletal: Numerous sclerotic lesions noted throughout the visualized axial and appendicular skeleton, highly concerning for widespread metastatic disease to the bones. The largest of the lesions are noted with than  T11, T12 and L2 vertebral  bodies which are nearly completely sclerotic. IMPRESSION: 1. No acute findings in the abdomen or pelvis. Specifically, no signs of bowel obstruction. 2. Widespread sclerotic lesions throughout the visualized axial and appendicular skeleton, highly concerning for metastatic disease to the bones. Statistically, this likely reflects prostate cancer. Correlation with PSA levels and physical examination is recommended. 3. Mild cardiomegaly with biatrial dilatation. 4. Trace bilateral pleural effusions lying dependently. 5. Tiny calcifications associated with DUB right renal hilum may represent 2-3 mm nonobstructive calculi, or may simply be vascular. 6. Additional incidental findings, as above. Electronically Signed   By: Vinnie Langton M.D.   On: 05/04/2015 18:42   Dg Abd 2 Views  05/04/2015  CLINICAL DATA:  Constipation, nausea and vomiting for 3 days, unable to have bowel movement, left leg cellulitis, history of prostate cancer. EXAM: ABDOMEN - 2 VIEW COMPARISON:  03/20/2006 FINDINGS: Mild gaseous distended small bowel loops are noted mid abdomen suspicious for ileus. There is significant gaseous distended right colon transverse colon and proximal left colon. Findings highly suspicious for colonic ileus. Some colonic gas and stool are noted in distal sigmoid colon and rectum. IMPRESSION: Mild gaseous distended small bowel loops are noted mid abdomen suspicious for ileus. There is significant gaseous distended right colon transverse colon and proximal left colon. Findings highly suspicious for colonic ileus. Some colonic gas and stool are noted in distal sigmoid colon and rectum. Electronically Signed   By: Lahoma Crocker M.D.   On: 05/04/2015 13:13    Anti-infectives: Anti-infectives    Start     Dose/Rate Route Frequency Ordered Stop   05/05/15 1600  amoxicillin-clavulanate (AUGMENTIN) 500-125 MG per tablet 500 mg     1 tablet Oral 3 times daily 05/05/15 1424     05/04/15 0549   vancomycin (VANCOCIN) IVPB 1000 mg/200 mL premix  Status:  Discontinued     1,000 mg 200 mL/hr over 60 Minutes Intravenous Every 18 hours 05/03/15 1713 05/03/15 1733   05/03/15 2200  vancomycin (VANCOCIN) IVPB 750 mg/150 ml premix  Status:  Discontinued     750 mg 150 mL/hr over 60 Minutes Intravenous Every 12 hours 05/03/15 1733 05/04/15 1502   05/01/15 2330  vancomycin (VANCOCIN) IVPB 1000 mg/200 mL premix  Status:  Discontinued     1,000 mg 200 mL/hr over 60 Minutes Intravenous Every 12 hours 05/01/15 2033 05/03/15 1713   05/01/15 2200  piperacillin-tazobactam (ZOSYN) IVPB 3.375 g  Status:  Discontinued     3.375 g 12.5 mL/hr over 240 Minutes Intravenous 3 times per day 05/01/15 2033 05/05/15 1532   05/01/15 1915  piperacillin-tazobactam (ZOSYN) IVPB 3.375 g  Status:  Discontinued     3.375 g 100 mL/hr over 30 Minutes Intravenous  Once 05/01/15 1911 05/01/15 1923   05/01/15 1700  ceFEPIme (MAXIPIME) 1 g in dextrose 5 % 50 mL IVPB  Status:  Discontinued     1 g 100 mL/hr over 30 Minutes Intravenous  Once 05/01/15 1653 05/01/15 1658   05/01/15 1700  ceFEPIme (MAXIPIME) 2 g in dextrose 5 % 50 mL IVPB     2 g 100 mL/hr over 30 Minutes Intravenous  Once 05/01/15 1658 05/01/15 1840   05/01/15 1645  vancomycin (VANCOCIN) IVPB 1000 mg/200 mL premix     1,000 mg 200 mL/hr over 60 Minutes Intravenous  Once 05/01/15 1646 05/01/15 1840      Assessment/Plan: Doing well Given his significant improvement we will sign off No surgical indication Caroleen Hamman, MD, FACS  05/05/2015

## 2015-05-05 NOTE — Progress Notes (Signed)
Large liquid

## 2015-05-05 NOTE — Care Management Important Message (Signed)
Important Message  Patient Details  Name: Chad Kirby MRN: [id number] Date of Birth: [birth year]   Medicare Important Message Given:  Yes    [name] 05/05/2015, 10:56 AM

## 2015-05-05 NOTE — Clinical Social Work Note (Signed)
Clinical Social Work Assessment  Patient Details  Name: Chad Kirby MRN: [id number] Date of Birth: 01/15/1930  Date of referral:  05/05/15               Reason for consult:  Discharge Planning                Permission sought to share information with:  Family Supports Permission granted to share information::  Yes, Verbal Permission Granted  Name::        Agency::     Relationship::   (Nancy Fiore- Spouse [phone])  Contact Information:     Housing/Transportation Living arrangements for the past 2 months:  Single Family Home Source of Information:  Patient, Spouse Patient Interpreter Needed:  None Criminal Activity/Legal Involvement Pertinent to Current Situation/Hospitalization:  No - Comment as needed Significant Relationships:  Spouse Lives with:    Do you feel safe going back to the place where you live?  Yes Need for family participation in patient care:  Yes (Comment) (Nancy Tafoya- Spouse [phone])  Care giving concerns:  Patient and his family reports that patient will need SNF placement.    Social Worker assessment / plan:  CSW was consulted by PT stating that patient will need SNF at discharge. CSW met with patient and his wife at bedside. CSW introduced herself and her role. Per patient and his he's agreeable to going to SNF preference [facility]. Reports that they aren't interested in any other facility. CSW provided a SNF list and requested they reviewed it in case [facility] is not able to offer a bed. Patient stated he did not want to consider another facility. CSW began Blue Medicare authorization.   FL2/ PASRR completed and faxed out. Awaiting bed offers.  CSW received bed offers and presented to patient and his wife. Accepted bed offer at [facility]. Informed Kim- admissions coordinator at [facility] of above. Still awaiting [employer] authorization. CSW will continue to follow and assist.   Employment status:  Retired Insurance information:   Medicare PT Recommendations:  Skilled Nursing Facility Information / Referral to community resources:  Skilled Nursing Facility  Patient/Family's Response to care:  Patient and his wife are in agreement that patient will need SNF placement at discharge. Accepted bed offer at [facility].   Patient/Family's Understanding of and Emotional Response to Diagnosis, Current Treatment, and Prognosis:  Patient and his wife are appreciative of CSW's assistance and understands CSWs role.   Emotional Assessment Appearance:  Appears stated age Attitude/Demeanor/Rapport:   (None) Affect (typically observed):  Accepting, Calm, Pleasant Orientation:  Oriented to Self, Oriented to Place, Oriented to  Time, Oriented to Situation Alcohol / Substance use:  Not Applicable Psych involvement (Current and /or in the community):  No (Comment)  Discharge Needs  Concerns to be addressed:  Discharge Planning Concerns Readmission within the last 30 days:  No Current discharge risk:  Chronically ill Barriers to Discharge:  Continued Medical Work up    E , LCSW 05/05/2015, 3:34 PM  

## 2015-05-05 NOTE — Progress Notes (Signed)
CSW faxed additional clinicals requested by [facility] for SNF authorization. CSW will continue to follow and assist.   Ernest Pine, MSW, [place] Work Department [phone]

## 2015-05-05 NOTE — Progress Notes (Signed)
[facility] [place] NAME: Chad Kirby    MR#:  [id number]  DATE OF BIRTH:  05-08-1929  SUBJECTIVE:  CHIEF COMPLAINT:  Patient is feeling better , had 2 large bowel movements  and feeling fine Wants to eat. Mom and daughter at bedside REVIEW OF SYSTEMS:  CONSTITUTIONAL: No fever, reporting fatigue or weakness.  EYES: No blurred or double vision.  EARS, NOSE, AND THROAT: No tinnitus or ear pain.  RESPIRATORY: No cough, shortness of breath, wheezing or hemoptysis.  CARDIOVASCULAR: Intermittent palpitations. No chest pain, orthopnea, edema.  GASTROINTESTINAL: No nausea, vomiting, diarrhea or abdominal pain. Constipated GENITOURINARY: No dysuria, hematuria.  ENDOCRINE: No polyuria, nocturia,  HEMATOLOGY: No anemia, easy bruising or bleeding SKIN: No rash or lesion. Left leg wound getting worse but better after starting antibiotics  MUSCULOSKELETAL: No joint pain or arthritis.   NEUROLOGIC: No tingling, numbness, weakness.  PSYCHIATRY: No anxiety or depression.   DRUG ALLERGIES:   Allergies  Allergen Reactions  . Latex Rash    VITALS:  Blood pressure 101/64, pulse 90, temperature 98.2 F (36.8 C), temperature source Oral, resp. rate 20, height 6\' 1"  (1.854 m), weight 103.42 kg (228 lb), SpO2 92 %.  PHYSICAL EXAMINATION:  GENERAL:  82 y.o.-year-old patient lying in the bed with no acute distress.  EYES: Pupils equal, round, reactive to light and accommodation. No scleral icterus. Extraocular muscles intact.  HEENT: Head atraumatic, normocephalic. Oropharynx and nasopharynx clear.  NECK:  Supple, no jugular venous distention. No thyroid enlargement, no tenderness.  LUNGS: Normal breath sounds bilaterally, no wheezing, rales,rhonchi or crepitation. No use of accessory muscles of respiration.  CARDIOVASCULAR: Irregularly irregular. No murmurs, rubs, or gallops.  ABDOMEN: Soft, nontender, some distention is present,  normal Bowel  sounds present. No organomegaly or mass.  EXTREMITIES: Left lower extremity-wound with purulent discharge, tender with some edema, erythema is better after starting IV antibiotics. No pedal edema, cyanosis, or clubbing.  NEUROLOGIC: Cranial nerves II through XII are intact. Muscle strength 5/5 in all extremities. Sensation intact. Gait not checked.  PSYCHIATRIC: The patient is alert and oriented x 3.  SKIN: No obvious rash, lesion, or ulcer.    LABORATORY PANEL:   CBC  Recent Labs Lab 05/04/15 0416  WBC 9.0  HGB 11.1*  HCT 32.5*  PLT 102*   ------------------------------------------------------------------------------------------------------------------  Chemistries   Recent Labs Lab 05/01/15 1633  05/03/15 0625 05/04/15 0416  NA 134*  < > 132* 131*  K 4.0  < > 3.5 3.4*  CL 101  < > 103 101  CO2 23  < > 21* 24  GLUCOSE 162*  < > 118* 111*  BUN 20  < > 25* 23*  CREATININE 1.12  < > 1.15 1.30*  CALCIUM 7.7*  < > 6.5* 6.6*  MG 1.8  --   --   --   AST 36  < > 27  --   ALT 28  < > 23  --   ALKPHOS 122  < > 94  --   BILITOT 2.0*  < > 1.2  --   < > = values in this interval not displayed. ------------------------------------------------------------------------------------------------------------------  Cardiac Enzymes  Recent Labs Lab 05/01/15 1633  TROPONINI 0.03   ------------------------------------------------------------------------------------------------------------------  RADIOLOGY:  Ct Abdomen Pelvis Wo Contrast  05/04/2015  CLINICAL DATA:  82 year old male with abdominal distention. No bowel movement for several days. EXAM: CT ABDOMEN AND PELVIS WITHOUT CONTRAST TECHNIQUE: Multidetector CT imaging of the abdomen and  pelvis was performed following the standard protocol without IV contrast. COMPARISON:  No priors. FINDINGS: Lower chest: Heart size is enlarged with biatrial dilatation. Atherosclerotic calcifications are noted in the left main, left anterior  descending, left circumflex and right coronary arteries. Mild calcifications of the aortic valve. Trace bilateral pleural effusions lying dependently. Dependent subsegmental atelectasis in the lower lobes of the lungs bilaterally. Hepatobiliary: No cystic or solid hepatic lesions are identified on today's noncontrast CT examination. Unenhanced appearance of the gallbladder is normal. Pancreas: No pancreatic mass or peripancreatic inflammatory changes on today's noncontrast CT examination. Spleen: Unremarkable. Adrenals/Urinary Tract: Tiny 2-3 mm calcifications in the right renal hilum, may be vascular or may be nonobstructive calculi. Parenchymal calcification in the upper pole of the left kidney is likely dystrophic. Unenhanced appearance of the kidneys is otherwise unremarkable. No ureteral stones. No hydroureteronephrosis. Urinary bladder is completely decompressed around an indwelling Foley catheter. Bilateral adrenal glands are normal in appearance. Stomach/Bowel: Unenhanced appearance of the stomach is normal. No pathologic dilatation of small bowel or colon. Moderate gaseous distention is noted in the colon, which is highly nonspecific. Appendix is not confidently identified may be surgically absent. Regardless, there are no inflammatory changes noted adjacent to the cecum to suggest the presence of an acute appendicitis at this time. Vascular/Lymphatic: Atherosclerotic calcifications are noted throughout the abdominal and pelvic vasculature, without evidence of aneurysm or dissection. No lymphadenopathy noted in the abdomen or pelvis on today's noncontrast CT examination. Reproductive: Prostate gland is diminutive and partially calcified. Seminal vesicles are unremarkable in appearance. Other: No significant volume of ascites.  No pneumoperitoneum. Musculoskeletal: Numerous sclerotic lesions noted throughout the visualized axial and appendicular skeleton, highly concerning for widespread metastatic disease  to the bones. The largest of the lesions are noted with than T11, T12 and L2 vertebral bodies which are nearly completely sclerotic. IMPRESSION: 1. No acute findings in the abdomen or pelvis. Specifically, no signs of bowel obstruction. 2. Widespread sclerotic lesions throughout the visualized axial and appendicular skeleton, highly concerning for metastatic disease to the bones. Statistically, this likely reflects prostate cancer. Correlation with PSA levels and physical examination is recommended. 3. Mild cardiomegaly with biatrial dilatation. 4. Trace bilateral pleural effusions lying dependently. 5. Tiny calcifications associated with DUB right renal hilum may represent 2-3 mm nonobstructive calculi, or may simply be vascular. 6. Additional incidental findings, as above. Electronically Signed   By: Vinnie Langton M.D.   On: 05/04/2015 18:42   Dg Abd 2 Views  05/04/2015  CLINICAL DATA:  Constipation, nausea and vomiting for 3 days, unable to have bowel movement, left leg cellulitis, history of prostate cancer. EXAM: ABDOMEN - 2 VIEW COMPARISON:  03/20/2006 FINDINGS: Mild gaseous distended small bowel loops are noted mid abdomen suspicious for ileus. There is significant gaseous distended right colon transverse colon and proximal left colon. Findings highly suspicious for colonic ileus. Some colonic gas and stool are noted in distal sigmoid colon and rectum. IMPRESSION: Mild gaseous distended small bowel loops are noted mid abdomen suspicious for ileus. There is significant gaseous distended right colon transverse colon and proximal left colon. Findings highly suspicious for colonic ileus. Some colonic gas and stool are noted in distal sigmoid colon and rectum. Electronically Signed   By: Lahoma Crocker M.D.   On: 05/04/2015 13:13    EKG:   Orders placed or performed during the hospital encounter of 05/01/15  . ED EKG  . ED EKG  . EKG 12-Lead  . EKG 12-Lead  ASSESSMENT AND PLAN:  *Ileus Clinically  improved. Had 2 bowel movements  Advance diet as tolerated Discussed with surgery Dr. Dahlia Byes    *A. fib with RVR-rate controlled now   amiodarone 200 mg by mouth once daily   he did not respond much to Cardizem drip and weaned off Cardizem drip discontinued heparin drip, INR is therapeutic at 2.82--3.6-3.48. Hold  Coumadin, monitor PT/INR Normal TSH Appreciate cardiology recommendations Levophed as needed to support the blood pressure to Maintain map at or greater than 65  Transition to by mouth Cardizem and Lopressor if heart rate is controlled.   *Sepsis with left leg cellulitis.  .  significantly improving with antibiotics discontinue vancomycin and  Zosyn. Started patient on by mouth Augmentin  Wound culture with gram-positive cocaine gram-negative rods, normal flora Surgery is not currently considering debridement at this point, continue wound care with Silvadene  *Neutropenia. Resolved Due to chemotherapy/sepsis  Follow-up CBC and oncology consult.  *Thrombocytopenia. Probably heparin-induced -platelet count at 97,000 today Follow-up CBC. History of prostate cancer. Follow-up oncology consult.   *Elevated bilirubin. Resolved  abdominal ultrasound with hepatic steatosis  *Deconditioning with physical therapy    All the records are reviewed and case discussed with Care Management/Social Workerr. Management plans discussed with the patient, daughter and they are in agreement.  CODE STATUS: fc   TOTALTIME TAKING CARE OF THIS PATIENT: 35  minutes.  More than 50% time was spent on coordination of care and counseling  POSSIBLE D/C IN am  DAYS, DEPENDING ON CLINICAL CONDITION.   [name] M.D on 05/05/2015 at 2:25 PM  Between 7am to 6pm - Pager - [phone] After 6pm go to [email or web address] - password [email or web address] [facility]  [place] [facility]  Office  [phone]  CC: Primary care physician; No primary care provider on file.

## 2015-05-05 NOTE — Progress Notes (Signed)
Family was updated about care. Reviewed medication and plan of care Family states patient is hallucinating and is confused. Pt is able to carry on a meaningful conversation with staff and answers questions appropiately. Patient does appear to talk excessively at times.Hallucination not observed by staff. Will continue to monitor and assess.

## 2015-05-05 NOTE — Care Management (Signed)
Patient transferred to 2A last pm.  Has been evaluated by physical therapy and recommendation is SNF when medically stable .  Patient currently NPO for abdominal distention. Surgery has been consulted and no indication for need of bowel decompression or surgical intervention at present.  Abd CT to rule out mechanical obstruction.

## 2015-05-06 ENCOUNTER — Encounter
Admission: RE | Admit: 2015-05-06 | Discharge: 2015-05-06 | Disposition: A | Discharge status: Home / Self Care | Payer: Medicare Other | Source: Ambulatory Visit | Attending: Internal Medicine | Admitting: Internal Medicine

## 2015-05-06 DIAGNOSIS — I482 Chronic atrial fibrillation: Secondary | ICD-10-CM | POA: Insufficient documentation

## 2015-05-06 DIAGNOSIS — K567 Ileus, unspecified: Secondary | ICD-10-CM | POA: Insufficient documentation

## 2015-05-06 DIAGNOSIS — Z792 Long term (current) use of antibiotics: Secondary | ICD-10-CM | POA: Insufficient documentation

## 2015-05-06 DIAGNOSIS — E871 Hypo-osmolality and hyponatremia: Secondary | ICD-10-CM | POA: Insufficient documentation

## 2015-05-06 LAB — WOUND CULTURE: CULTURE: NORMAL

## 2015-05-06 LAB — CULTURE, BLOOD (ROUTINE X 2): Culture: NO GROWTH

## 2015-05-06 LAB — PROTIME-INR
INR: 3.52
Prothrombin Time: 34.5 seconds — ABNORMAL HIGH (ref 11.4–15.0)

## 2015-05-06 MED ORDER — ALBUTEROL SULFATE (2.5 MG/3ML) 0.083% IN NEBU: 2.5000 mg | INHALATION_SOLUTION | RESPIRATORY_TRACT | Status: DC | PRN

## 2015-05-06 MED ORDER — AMIODARONE HCL 200 MG PO TABS: 200.0000 mg | ORAL_TABLET | Freq: Every day | ORAL | Status: DC

## 2015-05-06 MED ORDER — SENNOSIDES-DOCUSATE SODIUM 8.6-50 MG PO TABS: 2.0000 | ORAL_TABLET | Freq: Two times a day (BID) | ORAL | Status: DC

## 2015-05-06 MED ORDER — POLYETHYLENE GLYCOL 3350 17 G PO PACK
17.0000 g | PACK | Freq: Two times a day (BID) | ORAL | Status: AC
Start: 2015-05-06 — End: ?

## 2015-05-06 MED ORDER — HYDROCODONE-ACETAMINOPHEN 5-325 MG PO TABS: 1.0000 | ORAL_TABLET | ORAL | Status: DC | PRN

## 2015-05-06 MED ORDER — SILVER SULFADIAZINE 1 % EX CREA: TOPICAL_CREAM | Freq: Every day | CUTANEOUS | Status: DC

## 2015-05-06 MED ORDER — AMOXICILLIN-POT CLAVULANATE 500-125 MG PO TABS: 1.0000 | ORAL_TABLET | Freq: Three times a day (TID) | ORAL | Status: DC

## 2015-05-06 MED ORDER — GABAPENTIN 300 MG PO CAPS: 300.0000 mg | ORAL_CAPSULE | Freq: Two times a day (BID) | ORAL | Status: DC

## 2015-05-06 NOTE — Consult Note (Signed)
ANTICOAGULATION CONSULT NOTE - Follow Up Consult  Pharmacy Consult for warfarin Indication: atrial fibrillation  Allergies  Allergen Reactions  . Latex Rash    Patient Measurements: Height: 6\' 1"  (185.4 cm) Weight: 228 lb (103.42 kg) IBW/kg (Calculated) : 79.9  Vital Signs: Temp: 98.9 F (37.2 C) (03/03 0426) Temp Source: Oral (03/03 0426) BP: 130/86 mmHg (03/03 0426) Pulse Rate: 108 (03/03 0426)  Labs:  Recent Labs  05/04/15 0416 05/05/15 0555 05/06/15 0513  HGB 11.1*  --   --   HCT 32.5*  --   --   PLT 102*  --   --   LABPROT 35.5* 34.2* 34.5*  INR 3.65 3.48 3.52  CREATININE 1.30*  --   --     Estimated Creatinine Clearance: 54.4 mL/min (by C-G formula based on Cr of 1.3).   Medical History: Past Medical History  Diagnosis Date  . COPD (chronic obstructive pulmonary disease) ([place])   . Cancer [facility])     Prostate with bone mets-current chemo treatment  . Hyperlipidemia   . Atrial fibrillation ([facility])     Medications:  Scheduled:  . amiodarone  200 mg Oral Daily  . amoxicillin-clavulanate  1 tablet Oral TID  . antiseptic oral rinse  7 mL Mouth Rinse BID  . atorvastatin  80 mg Oral QPM  . calcium-vitamin D  2 tablet Oral BID  . diltiazem  360 mg Oral BH-q7a  . gabapentin  300 mg Oral BID  . metoprolol succinate  100 mg Oral BH-q7a  . polyethylene glycol  17 g Oral BID  . predniSONE  10 mg Oral Daily  . senna-docusate  2 tablet Oral BID  . silver sulfADIAZINE   Topical Daily  . sodium chloride flush  10-40 mL Intracatheter Q12H  . Warfarin - Pharmacist Dosing Inpatient   Does not apply q1800    Assessment: Pt is a 82 year old male with history of atrial fibrillation, goal INR 2-3, on warfarin 3mg  daily at home. Patient previously ordered heparin drip.    3/2   INR: 3.48   No Warfarin 3/3   INR: 3.52    Goal of Therapy:  INR 2-3 Monitor platelets by anticoagulation protocol: Yes   Plan:  INR is supratherapeutic. Of note, patient on Augmentin  and Amiodarone.  Will continue to hold dose and recheck INR in AM.   Pharmacy will continue to monitor and adjust per consult.    Tanita Palinkas A 05/06/2015,7:37 AM

## 2015-05-06 NOTE — Discharge Summary (Signed)
[facility] [place] NAME: Chad Kirby    MR#:  [id number]  DATE OF BIRTH:  [birth year]  DATE OF ADMISSION:  05/01/2015 ADMITTING PHYSICIAN: Demetrios Loll, MD  DATE OF DISCHARGE: 05/06/15 PRIMARY CARE PHYSICIAN: No primary care provider on file.    ADMISSION DIAGNOSIS:  Atrial fibrillation with rapid ventricular response (HCC) [I48.91] Cellulitis of left lower extremity [L03.116] Sepsis, due to unspecified organism ([place]) [A41.9]  DISCHARGE DIAGNOSIS:  Principal Problem:   Atrial fibrillation with RVR ([place]) Active Problems:   Sepsis ([place])   Cellulitis and abscess of leg   Cellulitis of left lower extremity Ileus  SECONDARY DIAGNOSIS:   Past Medical History  Diagnosis Date  . COPD (chronic obstructive pulmonary disease) ([place])   . Cancer [facility])     Prostate with bone mets-current chemo treatment  . Hyperlipidemia   . Atrial fibrillation [facility])     HOSPITAL COURSE:   *Ileus Clinically improved. Had 2 bowel movements Advance diet as tolerated Discussed with surgery Dr. Dahlia Byes, no surgical interventions needed    *A. fib with RVR-rate controlled now  amiodarone 200 mg by mouth once daily  he did not respond much to Cardizem drip and weaned off Cardizem drip discontinued heparin drip, INR is therapeutic at 2.82--3.6-3.48. -3.5 to Hold Coumadin, monitor PT/INR tomorrow and PCP at a skilled nursing facility to adjust Coumadin dose Normal TSH Appreciate cardiology recommendations-outpatient follow-up in 1 week   *Sepsis with left leg cellulitis.  . significantly improving with antibiotics discontinue vancomycin and Zosyn. Continue on by mouth Augmentin Wound culture with gram-positive cocaine gram-negative rods, normal flora Surgery is not currently considering debridement at this point, continue wound care with Silvadene  *Neutropenia. Resolved Due to chemotherapy/sepsis  Follow-up with oncology as an  outpatient for prostate cancer  *Thrombocytopenia. Probably heparin-induced -platelet count at 102,000  Follow-up CBC in a wk History of prostate cancer. Follow-up oncology OP   *Elevated bilirubin. Resolved abdominal ultrasound with hepatic steatosis  *Deconditioning with physical therapy-recommended skilled nursing facility    DISCHARGE CONDITIONS:   fair  CONSULTS OBTAINED:  Treatment Team:  Lloyd Huger, MD Yolonda Kida, MD Algernon Huxley, MD   PROCEDURES none  DRUG ALLERGIES:   Allergies  Allergen Reactions  . Latex Rash    DISCHARGE MEDICATIONS:   Current Discharge Medication List    START taking these medications   Details  albuterol (PROVENTIL) (2.5 MG/3ML) 0.083% nebulizer solution Take 3 mLs (2.5 mg total) by nebulization every 4 (four) hours as needed for wheezing or shortness of breath. Qty: 75 mL, Refills: 12    amiodarone (PACERONE) 200 MG tablet Take 1 tablet (200 mg total) by mouth daily. Qty: 30 tablet, Refills: 0    amoxicillin-clavulanate (AUGMENTIN) 500-125 MG tablet Take 1 tablet (500 mg total) by mouth 3 (three) times daily. Qty: 12 tablet, Refills: 0    gabapentin (NEURONTIN) 300 MG capsule Take 1 capsule (300 mg total) by mouth 2 (two) times daily. Qty: 60 capsule, Refills: 0    polyethylene glycol (MIRALAX / GLYCOLAX) packet Take 17 g by mouth 2 (two) times daily. Qty: 14 each, Refills: 0    senna-docusate (SENOKOT-S) 8.6-50 MG tablet Take 2 tablets by mouth 2 (two) times daily.    silver sulfADIAZINE (SILVADENE) 1 % cream Apply topically daily. Qty: 50 g, Refills: 0      CONTINUE these medications which have CHANGED   Details  HYDROcodone-acetaminophen (NORCO/VICODIN) 5-325 MG tablet Take 1  tablet by mouth every 4 (four) hours as needed. For pain. Qty: 30 tablet, Refills: 0      CONTINUE these medications which have NOT CHANGED   Details  acetaminophen (TYLENOL) 500 MG tablet Take 500 mg by mouth every 6 (six)  hours as needed. For pain.    atorvastatin (LIPITOR) 80 MG tablet Take 80 mg by mouth every evening. Refills: 1    Calcium Carbonate-Vitamin D 600-400 MG-UNIT tablet Take 2 tablets by mouth 2 (two) times daily.    diltiazem (CARDIZEM CD) 180 MG 24 hr capsule Take 360 mg by mouth every morning. Refills: 1    Leuprolide Acetate, 6 Month, (LUPRON) 45 MG injection Inject 45 mg into the muscle every 6 (six) months.    metoprolol succinate (TOPROL-XL) 100 MG 24 hr tablet Take 100 mg by mouth every morning. Refills: 1    predniSONE (DELTASONE) 10 MG tablet Take 10 mg by mouth daily. Refills: 9    prochlorperazine (COMPAZINE) 10 MG tablet Take 10 mg by mouth every 6 (six) hours as needed. For nausea and vomiting.      STOP taking these medications     dexamethasone (DECADRON) 4 MG tablet      warfarin (COUMADIN) 3 MG tablet          DISCHARGE INSTRUCTIONS:  Activity per PT recommendations Continue 2 L of oxygen via nasal cannula and wean off as tolerated Follow-up with primary care physician at the facility in 2 days Repeat PT/INR on March 4 and PCP at the facility dose Coumadin based on the PT/INR results Follow-up with cardiology Dr. Lorinda Creed n a week Continue left lower extremity wound care    DIET:  Cardiac diet  DISCHARGE CONDITION:  Fair  ACTIVITY:  Activity as tolerated per PT  OXYGEN:  Home Oxygen: Yes.     Oxygen Delivery: 2 liters/min via Patient connected to nasal cannula oxygen  DISCHARGE LOCATION:  nursing home   If you experience worsening of your admission symptoms, develop shortness of breath, life threatening emergency, suicidal or homicidal thoughts you must seek medical attention immediately by calling 911 or calling your MD immediately  if symptoms less severe.  You Must read complete instructions/literature along with all the possible adverse reactions/side effects for all the Medicines you take and that have been prescribed to you. Take any  new Medicines after you have completely understood and accpet all the possible adverse reactions/side effects.   Please note  You were cared for by a hospitalist during your hospital stay. If you have any questions about your discharge medications or the care you received while you were in the hospital after you are discharged, you can call the unit and asked to speak with the hospitalist on call if the hospitalist that took care of you is not available. Once you are discharged, your primary care physician will handle any further medical issues. Please note that NO REFILLS for any discharge medications will be authorized once you are discharged, as it is imperative that you return to your primary care physician (or establish a relationship with a primary care physician if you do not have one) for your aftercare needs so that they can reassess your need for medications and monitor your lab values.     Today  Chief Complaint  Patient presents with  . Weakness   patient is doing fine. Having bowel movements. No complaints. Heart rate is well controlled Still weak and wants to go to rehabilitation to get strong Daughter  and wife at bedside  ROS:  CONSTITUTIONAL: Denies fevers, chills. Denies any fatigue, weakness.  EYES: Denies blurry vision, double vision, eye pain. EARS, NOSE, THROAT: Denies tinnitus, ear pain, hearing loss. RESPIRATORY: Denies cough, wheeze, shortness of breath.  CARDIOVASCULAR: Denies chest pain, palpitations, edema.  GASTROINTESTINAL: Denies nausea, vomiting, diarrhea, abdominal pain. Denies bright red blood per rectum. GENITOURINARY: Denies dysuria, hematuria. ENDOCRINE: Denies nocturia or thyroid problems. HEMATOLOGIC AND LYMPHATIC: Denies easy bruising or bleeding. SKIN: Denies rash or lesion. Left lower extremity wound healing well MUSCULOSKELETAL: Denies pain in neck, back, shoulder, knees, hips or arthritic symptoms.  NEUROLOGIC: Denies paralysis, paresthesias.   PSYCHIATRIC: Denies anxiety or depressive symptoms.   VITAL SIGNS:  Blood pressure 114/53, pulse 105, temperature 97.8 F (36.6 C), temperature source Oral, resp. rate 20, height 6\' 1"  (1.854 m), weight 103.42 kg (228 lb), SpO2 95 %.  I/O:   Intake/Output Summary (Last 24 hours) at 05/06/15 1344 Last data filed at 05/06/15 1300  Gross per 24 hour  Intake 2748.75 ml  Output   1850 ml  Net 898.75 ml    PHYSICAL EXAMINATION:  GENERAL:  82 y.o.-year-old patient lying in the bed with no acute distress.  EYES: Pupils equal, round, reactive to light and accommodation. No scleral icterus. Extraocular muscles intact.  HEENT: Head atraumatic, normocephalic. Oropharynx and nasopharynx clear.  NECK:  Supple, no jugular venous distention. No thyroid enlargement, no tenderness.  LUNGS: Normal breath sounds bilaterally, no wheezing, rales,rhonchi or crepitation. No use of accessory muscles of respiration.  CARDIOVASCULAR: S1, S2 normal. No murmurs, rubs, or gallops.  ABDOMEN: Soft, non-tender, non-distended. Bowel sounds present. No organomegaly or mass.  EXTREMITIES: No pedal edema, cyanosis, or clubbing. LLE wound healing well ,clean dressing NEUROLOGIC: Cranial nerves II through XII are intact. Muscle strength 5/5 in all extremities. Sensation intact. Gait not checked.  PSYCHIATRIC: The patient is alert and oriented x 3.  SKIN: No obvious rash, lesion, or ulcer.   DATA REVIEW:   CBC  Recent Labs Lab 05/04/15 0416  WBC 9.0  HGB 11.1*  HCT 32.5*  PLT 102*    Chemistries   Recent Labs Lab 05/01/15 1633  05/03/15 0625 05/04/15 0416  NA 134*  < > 132* 131*  K 4.0  < > 3.5 3.4*  CL 101  < > 103 101  CO2 23  < > 21* 24  GLUCOSE 162*  < > 118* 111*  BUN 20  < > 25* 23*  CREATININE 1.12  < > 1.15 1.30*  CALCIUM 7.7*  < > 6.5* 6.6*  MG 1.8  --   --   --   AST 36  < > 27  --   ALT 28  < > 23  --   ALKPHOS 122  < > 94  --   BILITOT 2.0*  < > 1.2  --   < > = values in this  interval not displayed.  Cardiac Enzymes  Recent Labs Lab 05/01/15 1633  TROPONINI 0.03    Microbiology Results  Results for orders placed or performed during the hospital encounter of 05/01/15  Urine culture     Status: None   Collection Time: 05/01/15  4:17 PM  Result Value Ref Range Status   Specimen Description URINE, RANDOM  Final   Special Requests NONE  Final   Culture MULTIPLE SPECIES PRESENT, SUGGEST RECOLLECTION  Final   Report Status 05/03/2015 FINAL  Final  Rapid Influenza A&B Antigens ([place] only)     Status:  None   Collection Time: 05/01/15  4:32 PM  Result Value Ref Range Status   Influenza A [facility]) NOT DETECTED  Final   Influenza B ([facility]) NOT DETECTED  Final  Blood culture (routine x 2)     Status: None (Preliminary result)   Collection Time: 05/01/15  4:34 PM  Result Value Ref Range Status   Specimen Description BLOOD LEFT FOREARM  Final   Special Requests BOTTLES DRAWN AEROBIC AND ANAEROBIC  1CC  Final   Culture NO GROWTH 3 DAYS  Final   Report Status PENDING  Incomplete  Blood culture (routine x 2)     Status: None   Collection Time: 05/01/15  4:34 PM  Result Value Ref Range Status   Specimen Description BLOOD RIGHT FOREARM  Final   Special Requests BOTTLES DRAWN AEROBIC AND ANAEROBIC  1CC  Final   Culture  Setup Time   Final    GRAM POSITIVE COCCI AEROBIC BOTTLE ONLY CRITICAL RESULT CALLED TO, READ BACK BY AND VERIFIED WITH: HENRY ZOMPA 05/02/15 1030AM MLM    Culture   Final    VIRIDANS STREPTOCOCCUS AEROBIC BOTTLE ONLY Results consistent with contamination.    Report Status 05/06/2015 FINAL  Final  Blood Culture ID Panel (Reflexed)     Status: Abnormal   Collection Time: 05/01/15  4:34 PM  Result Value Ref Range Status   Enterococcus species NOT DETECTED NOT DETECTED Final   Vancomycin resistance NOT DETECTED NOT DETECTED Final   Listeria monocytogenes NOT DETECTED NOT DETECTED Final   Staphylococcus species NOT DETECTED NOT DETECTED Final    Staphylococcus aureus NOT DETECTED NOT DETECTED Final   Methicillin resistance NOT DETECTED NOT DETECTED Final   Streptococcus species DETECTED (A) NOT DETECTED Final    Comment: CRITICAL RESULT CALLED TO, READ BACK BY AND VERIFIED WITH: HENRY ZOMPA 05/02/15 1030AM MLM    Streptococcus agalactiae NOT DETECTED NOT DETECTED Final   Streptococcus pneumoniae NOT DETECTED NOT DETECTED Final   Streptococcus pyogenes NOT DETECTED NOT DETECTED Final   Acinetobacter baumannii NOT DETECTED NOT DETECTED Final   Enterobacteriaceae species NOT DETECTED NOT DETECTED Final   Enterobacter cloacae complex NOT DETECTED NOT DETECTED Final   Escherichia coli NOT DETECTED NOT DETECTED Final   Klebsiella oxytoca NOT DETECTED NOT DETECTED Final   Klebsiella pneumoniae NOT DETECTED NOT DETECTED Final   Proteus species NOT DETECTED NOT DETECTED Final   Serratia marcescens NOT DETECTED NOT DETECTED Final   Carbapenem resistance NOT DETECTED NOT DETECTED Final   Haemophilus influenzae NOT DETECTED NOT DETECTED Final   Neisseria meningitidis NOT DETECTED NOT DETECTED Final   Pseudomonas aeruginosa NOT DETECTED NOT DETECTED Final   Candida albicans NOT DETECTED NOT DETECTED Final   Candida glabrata NOT DETECTED NOT DETECTED Final   Candida krusei NOT DETECTED NOT DETECTED Final   Candida parapsilosis NOT DETECTED NOT DETECTED Final   Candida tropicalis NOT DETECTED NOT DETECTED Final  MRSA PCR Screening     Status: None   Collection Time: 05/01/15  8:00 PM  Result Value Ref Range Status   MRSA by PCR NEGATIVE NEGATIVE Final    Comment:        The GeneXpert MRSA Assay (FDA approved for NASAL specimens only), is one component of a comprehensive MRSA colonization surveillance program. It is not intended to diagnose MRSA infection nor to guide or monitor treatment for MRSA infections.   Wound culture     Status: None   Collection Time: 05/02/15  1:10 PM  Result Value  Ref Range Status   Specimen Description  WOUND  Final   Special Requests Immunocompromised  Final   Gram Stain   Final    RARE WBC SEEN FEW GRAM POSITIVE COCCI FEW GRAM NEGATIVE RODS    Culture NORMAL SKIN FLORA  Final   Report Status 05/06/2015 FINAL  Final    RADIOLOGY:  Ct Abdomen Pelvis Wo Contrast  05/04/2015  CLINICAL DATA:  82 year old male with abdominal distention. No bowel movement for several days. EXAM: CT ABDOMEN AND PELVIS WITHOUT CONTRAST TECHNIQUE: Multidetector CT imaging of the abdomen and pelvis was performed following the standard protocol without IV contrast. COMPARISON:  No priors. FINDINGS: Lower chest: Heart size is enlarged with biatrial dilatation. Atherosclerotic calcifications are noted in the left main, left anterior descending, left circumflex and right coronary arteries. Mild calcifications of the aortic valve. Trace bilateral pleural effusions lying dependently. Dependent subsegmental atelectasis in the lower lobes of the lungs bilaterally. Hepatobiliary: No cystic or solid hepatic lesions are identified on today's noncontrast CT examination. Unenhanced appearance of the gallbladder is normal. Pancreas: No pancreatic mass or peripancreatic inflammatory changes on today's noncontrast CT examination. Spleen: Unremarkable. Adrenals/Urinary Tract: Tiny 2-3 mm calcifications in the right renal hilum, may be vascular or may be nonobstructive calculi. Parenchymal calcification in the upper pole of the left kidney is likely dystrophic. Unenhanced appearance of the kidneys is otherwise unremarkable. No ureteral stones. No hydroureteronephrosis. Urinary bladder is completely decompressed around an indwelling Foley catheter. Bilateral adrenal glands are normal in appearance. Stomach/Bowel: Unenhanced appearance of the stomach is normal. No pathologic dilatation of small bowel or colon. Moderate gaseous distention is noted in the colon, which is highly nonspecific. Appendix is not confidently identified may be surgically  absent. Regardless, there are no inflammatory changes noted adjacent to the cecum to suggest the presence of an acute appendicitis at this time. Vascular/Lymphatic: Atherosclerotic calcifications are noted throughout the abdominal and pelvic vasculature, without evidence of aneurysm or dissection. No lymphadenopathy noted in the abdomen or pelvis on today's noncontrast CT examination. Reproductive: Prostate gland is diminutive and partially calcified. Seminal vesicles are unremarkable in appearance. Other: No significant volume of ascites.  No pneumoperitoneum. Musculoskeletal: Numerous sclerotic lesions noted throughout the visualized axial and appendicular skeleton, highly concerning for widespread metastatic disease to the bones. The largest of the lesions are noted with than T11, T12 and L2 vertebral bodies which are nearly completely sclerotic. IMPRESSION: 1. No acute findings in the abdomen or pelvis. Specifically, no signs of bowel obstruction. 2. Widespread sclerotic lesions throughout the visualized axial and appendicular skeleton, highly concerning for metastatic disease to the bones. Statistically, this likely reflects prostate cancer. Correlation with PSA levels and physical examination is recommended. 3. Mild cardiomegaly with biatrial dilatation. 4. Trace bilateral pleural effusions lying dependently. 5. Tiny calcifications associated with DUB right renal hilum may represent 2-3 mm nonobstructive calculi, or may simply be vascular. 6. Additional incidental findings, as above. Electronically Signed   By: Vinnie Langton M.D.   On: 05/04/2015 18:42   Dg Chest Port 1 View  05/02/2015  CLINICAL DATA:  82 year old male with PICC line placement. EXAM: PORTABLE CHEST 1 VIEW COMPARISON:  Radiograph dated 05/02/2015 FINDINGS: A right-sided PICC is noted with tip over central SVC. Single-view the chest demonstrate mild eventration of the left hemidiaphragm with associated minimal subsegmental atelectatic  changes of the left lung base. There is no consolidation, pleural effusion, or pneumothorax. Stable cardiac silhouette. The osseous structures appear unremarkable. IMPRESSION: Right-sided PICC over central  SVC. Electronically Signed   By: Anner Crete M.D.   On: 05/02/2015 18:41   Dg Chest Port 1 View  05/02/2015  CLINICAL DATA:  Central line placement. History of prostate carcinoma with bone Mets. EXAM: PORTABLE CHEST 1 VIEW COMPARISON:  05/01/2015 FINDINGS: There is no evidence of a central line on the included field of view. There is no pneumothorax. Mild enlargement of the cardiac silhouette. No mediastinal or hilar masses or convincing adenopathy. There is opacity at the left lung base similar to the prior study likely chronic atelectasis or scarring. Lungs otherwise clear. No pleural effusion. Bony thorax is demineralized but grossly intact. IMPRESSION: 1. No evidence of a central line on the included field of view. 2. No pneumothorax.  No acute cardiopulmonary disease. Electronically Signed   By: Lajean Manes M.D.   On: 05/02/2015 16:12   Dg Abd 2 Views  05/04/2015  CLINICAL DATA:  Constipation, nausea and vomiting for 3 days, unable to have bowel movement, left leg cellulitis, history of prostate cancer. EXAM: ABDOMEN - 2 VIEW COMPARISON:  03/20/2006 FINDINGS: Mild gaseous distended small bowel loops are noted mid abdomen suspicious for ileus. There is significant gaseous distended right colon transverse colon and proximal left colon. Findings highly suspicious for colonic ileus. Some colonic gas and stool are noted in distal sigmoid colon and rectum. IMPRESSION: Mild gaseous distended small bowel loops are noted mid abdomen suspicious for ileus. There is significant gaseous distended right colon transverse colon and proximal left colon. Findings highly suspicious for colonic ileus. Some colonic gas and stool are noted in distal sigmoid colon and rectum. Electronically Signed   By: Lahoma Crocker M.D.    On: 05/04/2015 13:13    EKG:   Orders placed or performed during the hospital encounter of 05/01/15  . ED EKG  . ED EKG  . EKG 12-Lead  . EKG 12-Lead      Management plans discussed with the patient, family and they are in agreement.  CODE STATUS:     Code Status Orders        Start     Ordered   05/01/15 2034  Full code   Continuous     05/01/15 2033    Code Status History    Date Active Date Inactive Code Status Order ID Comments User Context   This patient has a current code status but no historical code status.    Advance Directive Documentation        Most Recent Value   Type of Advance Directive  Living will   Pre-existing out of facility DNR order (yellow form or pink MOST form)     "MOST" Form in Place?        TOTAL TIME TAKING CARE OF THIS PATIENT: 45  minutes.    @[facility] @  on 05/06/2015 at 1:44 PM  Between 7am to 6pm - Pager - [phone]  After 6pm go to [email or web address] - password [email or web address] [facility]  [place] [facility]  Office  [phone]  CC: Primary care physician; No primary care provider on file.

## 2015-05-06 NOTE — Plan of Care (Signed)
Problem: Cardiac: Goal: Ability to achieve and maintain adequate cardiopulmonary perfusion will improve Pt remains in Afib but rate is controlled.

## 2015-05-06 NOTE — Progress Notes (Signed)
Clinical Social Worker was informed that patient will be medically ready to discharge to [facility]. Patient and his family are in a agreement with plan. CSW called Kin- admissions coordinator at [facility] to confirm that patient's bed is ready. Provided patient's room number 218 and number to call for report [phone] . All discharge information faxed to [facility] via [place]. Rx's added to discharge packet.   RN will call report and patient will discharge to [facility] via [facility].  Ernest Pine, MSW, [place] Social Work Department [phone]

## 2015-05-06 NOTE — Progress Notes (Signed)
Informed DR Gouru of pt's INR level of 3.56 stated that she will come see pt,

## 2015-05-06 NOTE — Clinical Social Work Placement (Signed)
   CLINICAL SOCIAL WORK PLACEMENT  NOTE  Date:  05/06/2015  Patient Details  Name: Chad Kirby MRN: [id number] Date of Birth: 1929/06/12  Clinical Social Work is seeking post-discharge placement for this patient at the [place] level of care (*CSW will initial, date and re-position this form in  chart as items are completed):  Yes   Patient/family provided with [place] Work Department's list of facilities offering this level of care within the geographic area requested by the patient (or if unable, by the patient's family).  Yes   Patient/family informed of their freedom to choose among providers that offer the needed level of care, that participate in Medicare, Medicaid or managed care program needed by the patient, have an available bed and are willing to accept the patient.  Yes   Patient/family informed of [facility]'s ownership interest in [facility] and [facility], as well as of the fact that they are under no obligation to receive care at these facilities.  PASRR submitted to EDS on 05/05/15     PASRR number received on 05/05/15     Existing PASRR number confirmed on       FL2 transmitted to all facilities in geographic area requested by pt/family on 05/05/15     FL2 transmitted to all facilities within larger geographic area on       Patient informed that his/her managed care company has contracts with or will negotiate with certain facilities, including the following:        Yes   Patient/family informed of bed offers received.  Patient chooses bed at  [facility] )     Physician recommends and patient chooses bed at      Patient to be transferred to  [facility]) on 05/06/15.  Patient to be transferred to facility by  [facility])     Patient family notified on 05/06/15 of transfer.  Name of family member notified:   ([place])     PHYSICIAN       Additional Comment:     _______________________________________________ Baldemar Lenis, LCSW 05/06/2015, 2:03 PM

## 2015-05-06 NOTE — Plan of Care (Signed)
Problem: Activity: Goal: Risk for activity intolerance will decrease Outcome: Not Progressing Pt is weak needs assistance with mobility.

## 2015-05-06 NOTE — Progress Notes (Signed)
Patient is discharge to SNF  In a stable condition, report given to floor nurse , EMS called , picc and sl removed as order , dsg apply no bleeding noted , family at bedside.awaiting transportation

## 2015-05-06 NOTE — Discharge Instructions (Signed)
Activity per PT recommendations Continue 2 L of oxygen via nasal cannula and wean off as tolerated Follow-up with primary care physician at the facility in 2 days Repeat PT/INR on March 4 and PCP at the facility dose Coumadin based on the PT/INR results Follow-up with cardiology Dr. Lorinda Creed n a week Continue left lower extremity wound care

## 2015-05-06 NOTE — Progress Notes (Signed)
CSW received authorization from [facility]. Authorization Number [id number]. Been approved for 3 days today going to [facility]. Next review date 05/08/15. Informed Kim- admissions Mudlogger at [facility] of above. CSW informed MD Gouru that patient's insurance authorization was approved. CSW will continue to follow and assist.   Ernest Pine, MSW, [place] [facility] Department [phone]

## 2015-05-07 DIAGNOSIS — E871 Hypo-osmolality and hyponatremia: Secondary | ICD-10-CM | POA: Diagnosis not present

## 2015-05-07 DIAGNOSIS — K567 Ileus, unspecified: Secondary | ICD-10-CM | POA: Diagnosis not present

## 2015-05-07 DIAGNOSIS — Z792 Long term (current) use of antibiotics: Secondary | ICD-10-CM | POA: Diagnosis not present

## 2015-05-07 DIAGNOSIS — I482 Chronic atrial fibrillation: Secondary | ICD-10-CM | POA: Diagnosis not present

## 2015-05-07 LAB — URINALYSIS COMPLETE WITH MICROSCOPIC (ARMC ONLY)
BACTERIA UA: NONE SEEN
Bilirubin Urine: NEGATIVE
Glucose, UA: NEGATIVE mg/dL
Ketones, ur: NEGATIVE mg/dL
Leukocytes, UA: NEGATIVE
Nitrite: NEGATIVE
PH: 6 (ref 5.0–8.0)
PROTEIN: 30 mg/dL — AB
Specific Gravity, Urine: 1.018 (ref 1.005–1.030)

## 2015-05-07 LAB — PROTIME-INR
INR: 2.72
PROTHROMBIN TIME: 28.4 s — AB (ref 11.4–15.0)

## 2015-05-07 LAB — GLUCOSE, CAPILLARY: GLUCOSE-CAPILLARY: 103 mg/dL — AB (ref 65–99)

## 2015-05-09 DIAGNOSIS — I482 Chronic atrial fibrillation: Secondary | ICD-10-CM | POA: Diagnosis not present

## 2015-05-09 LAB — URINE CULTURE: CULTURE: NO GROWTH

## 2015-05-09 LAB — PATHOLOGIST SMEAR REVIEW

## 2015-05-09 LAB — PROTIME-INR
INR: 1.66
Prothrombin Time: 19.6 seconds — ABNORMAL HIGH (ref 11.4–15.0)

## 2015-05-11 DIAGNOSIS — I482 Chronic atrial fibrillation: Secondary | ICD-10-CM | POA: Diagnosis not present

## 2015-05-11 LAB — BRAIN NATRIURETIC PEPTIDE: B NATRIURETIC PEPTIDE 5: 274 pg/mL — AB (ref 0.0–100.0)

## 2015-05-11 LAB — COMPREHENSIVE METABOLIC PANEL
ALT: 26 U/L (ref 17–63)
AST: 28 U/L (ref 15–41)
Albumin: 2.2 g/dL — ABNORMAL LOW (ref 3.5–5.0)
Alkaline Phosphatase: 107 U/L (ref 38–126)
Anion gap: 7 (ref 5–15)
BUN: 19 mg/dL (ref 6–20)
CHLORIDE: 99 mmol/L — AB (ref 101–111)
CO2: 28 mmol/L (ref 22–32)
CREATININE: 1.11 mg/dL (ref 0.61–1.24)
Calcium: 8.5 mg/dL — ABNORMAL LOW (ref 8.9–10.3)
GFR, EST NON AFRICAN AMERICAN: 59 mL/min — AB (ref >60)
Glucose, Bld: 90 mg/dL (ref 65–99)
POTASSIUM: 3.4 mmol/L — AB (ref 3.5–5.1)
SODIUM: 134 mmol/L — AB (ref 135–145)
Total Bilirubin: 0.8 mg/dL (ref 0.3–1.2)
Total Protein: 5.1 g/dL — ABNORMAL LOW (ref 6.5–8.1)

## 2015-05-11 LAB — MAGNESIUM: MAGNESIUM: 1.7 mg/dL (ref 1.7–2.4)

## 2015-05-12 DIAGNOSIS — I482 Chronic atrial fibrillation: Secondary | ICD-10-CM | POA: Diagnosis not present

## 2015-05-12 LAB — CBC WITH DIFFERENTIAL/PLATELET
BASOS ABS: 0 10*3/uL (ref 0–0.1)
BASOS PCT: 0 %
EOS ABS: 0 10*3/uL (ref 0–0.7)
Eosinophils Relative: 0 %
HEMATOCRIT: 34.5 % — AB (ref 40.0–52.0)
HEMOGLOBIN: 11.8 g/dL — AB (ref 13.0–18.0)
LYMPHS PCT: 3 %
Lymphs Abs: 0.3 10*3/uL — ABNORMAL LOW (ref 1.0–3.6)
MCH: 36.6 pg — AB (ref 26.0–34.0)
MCHC: 34.2 g/dL (ref 32.0–36.0)
MCV: 106.9 fL — ABNORMAL HIGH (ref 80.0–100.0)
MONOS PCT: 7 %
Monocytes Absolute: 0.6 10*3/uL (ref 0.2–1.0)
NEUTROS PCT: 90 %
Neutro Abs: 8.3 10*3/uL — ABNORMAL HIGH (ref 1.4–6.5)
Platelets: 199 10*3/uL (ref 150–440)
RBC: 3.23 MIL/uL — ABNORMAL LOW (ref 4.40–5.90)
RDW: 15.2 % — ABNORMAL HIGH (ref 11.5–14.5)
WBC: 9.2 10*3/uL (ref 3.8–10.6)

## 2015-05-12 LAB — COMPREHENSIVE METABOLIC PANEL
ALBUMIN: 2.2 g/dL — AB (ref 3.5–5.0)
ALK PHOS: 108 U/L (ref 38–126)
ALT: 22 U/L (ref 17–63)
AST: 24 U/L (ref 15–41)
Anion gap: 7 (ref 5–15)
BILIRUBIN TOTAL: 0.9 mg/dL (ref 0.3–1.2)
BUN: 19 mg/dL (ref 6–20)
CALCIUM: 8 mg/dL — AB (ref 8.9–10.3)
CO2: 28 mmol/L (ref 22–32)
CREATININE: 1.17 mg/dL (ref 0.61–1.24)
Chloride: 104 mmol/L (ref 101–111)
GFR calc Af Amer: >60 mL/min (ref >60)
GFR calc non Af Amer: 56 mL/min — ABNORMAL LOW (ref >60)
GLUCOSE: 80 mg/dL (ref 65–99)
Potassium: 3 mmol/L — ABNORMAL LOW (ref 3.5–5.1)
SODIUM: 139 mmol/L (ref 135–145)
Total Protein: 4.9 g/dL — ABNORMAL LOW (ref 6.5–8.1)

## 2015-05-15 ENCOUNTER — Encounter: Payer: Self-pay | Admitting: Emergency Medicine

## 2015-05-15 ENCOUNTER — Inpatient Hospital Stay
Admission: EM | Admit: 2015-05-15 | Discharge: 2015-05-23 | DRG: 378 | Disposition: A | Discharge status: Skilled Nursing Facility | Payer: Medicare Other | Attending: Internal Medicine | Admitting: Internal Medicine

## 2015-05-15 DIAGNOSIS — N183 Chronic kidney disease, stage 3 (moderate): Secondary | ICD-10-CM | POA: Diagnosis present

## 2015-05-15 DIAGNOSIS — K5900 Constipation, unspecified: Secondary | ICD-10-CM | POA: Diagnosis present

## 2015-05-15 DIAGNOSIS — L89899 Pressure ulcer of other site, unspecified stage: Secondary | ICD-10-CM | POA: Diagnosis present

## 2015-05-15 DIAGNOSIS — Z66 Do not resuscitate: Secondary | ICD-10-CM | POA: Diagnosis present

## 2015-05-15 DIAGNOSIS — Z79899 Other long term (current) drug therapy: Secondary | ICD-10-CM

## 2015-05-15 DIAGNOSIS — C61 Malignant neoplasm of prostate: Secondary | ICD-10-CM | POA: Diagnosis present

## 2015-05-15 DIAGNOSIS — Y92129 Unspecified place in nursing home as the place of occurrence of the external cause: Secondary | ICD-10-CM

## 2015-05-15 DIAGNOSIS — Z7952 Long term (current) use of systemic steroids: Secondary | ICD-10-CM

## 2015-05-15 DIAGNOSIS — K567 Ileus, unspecified: Secondary | ICD-10-CM

## 2015-05-15 DIAGNOSIS — Z808 Family history of malignant neoplasm of other organs or systems: Secondary | ICD-10-CM | POA: Diagnosis not present

## 2015-05-15 DIAGNOSIS — D62 Acute posthemorrhagic anemia: Secondary | ICD-10-CM | POA: Diagnosis present

## 2015-05-15 DIAGNOSIS — R630 Anorexia: Secondary | ICD-10-CM | POA: Diagnosis present

## 2015-05-15 DIAGNOSIS — Z7901 Long term (current) use of anticoagulants: Secondary | ICD-10-CM | POA: Diagnosis not present

## 2015-05-15 DIAGNOSIS — T45515A Adverse effect of anticoagulants, initial encounter: Secondary | ICD-10-CM | POA: Diagnosis present

## 2015-05-15 DIAGNOSIS — R58 Hemorrhage, not elsewhere classified: Secondary | ICD-10-CM

## 2015-05-15 DIAGNOSIS — R710 Precipitous drop in hematocrit: Secondary | ICD-10-CM | POA: Diagnosis present

## 2015-05-15 DIAGNOSIS — I509 Heart failure, unspecified: Secondary | ICD-10-CM | POA: Diagnosis present

## 2015-05-15 DIAGNOSIS — K2970 Gastritis, unspecified, without bleeding: Secondary | ICD-10-CM | POA: Diagnosis present

## 2015-05-15 DIAGNOSIS — D124 Benign neoplasm of descending colon: Secondary | ICD-10-CM | POA: Diagnosis present

## 2015-05-15 DIAGNOSIS — K922 Gastrointestinal hemorrhage, unspecified: Secondary | ICD-10-CM | POA: Diagnosis present

## 2015-05-15 DIAGNOSIS — I482 Chronic atrial fibrillation: Secondary | ICD-10-CM | POA: Diagnosis present

## 2015-05-15 DIAGNOSIS — I959 Hypotension, unspecified: Secondary | ICD-10-CM | POA: Diagnosis present

## 2015-05-15 DIAGNOSIS — R0602 Shortness of breath: Secondary | ICD-10-CM

## 2015-05-15 DIAGNOSIS — K5730 Diverticulosis of large intestine without perforation or abscess without bleeding: Secondary | ICD-10-CM | POA: Diagnosis present

## 2015-05-15 DIAGNOSIS — D6832 Hemorrhagic disorder due to extrinsic circulating anticoagulants: Secondary | ICD-10-CM | POA: Diagnosis present

## 2015-05-15 DIAGNOSIS — Z823 Family history of stroke: Secondary | ICD-10-CM

## 2015-05-15 DIAGNOSIS — J449 Chronic obstructive pulmonary disease, unspecified: Secondary | ICD-10-CM | POA: Diagnosis present

## 2015-05-15 DIAGNOSIS — C7951 Secondary malignant neoplasm of bone: Secondary | ICD-10-CM | POA: Diagnosis present

## 2015-05-15 DIAGNOSIS — E785 Hyperlipidemia, unspecified: Secondary | ICD-10-CM | POA: Diagnosis present

## 2015-05-15 DIAGNOSIS — Z96651 Presence of right artificial knee joint: Secondary | ICD-10-CM | POA: Diagnosis present

## 2015-05-15 DIAGNOSIS — D122 Benign neoplasm of ascending colon: Secondary | ICD-10-CM | POA: Diagnosis present

## 2015-05-15 DIAGNOSIS — Z7951 Long term (current) use of inhaled steroids: Secondary | ICD-10-CM

## 2015-05-15 DIAGNOSIS — Z87891 Personal history of nicotine dependence: Secondary | ICD-10-CM | POA: Diagnosis not present

## 2015-05-15 DIAGNOSIS — K264 Chronic or unspecified duodenal ulcer with hemorrhage: Secondary | ICD-10-CM | POA: Diagnosis present

## 2015-05-15 LAB — COMPREHENSIVE METABOLIC PANEL
ALBUMIN: 2.5 g/dL — AB (ref 3.5–5.0)
ALK PHOS: 139 U/L — AB (ref 38–126)
ALT: 19 U/L (ref 17–63)
ANION GAP: 7 (ref 5–15)
AST: 26 U/L (ref 15–41)
BILIRUBIN TOTAL: 1.1 mg/dL (ref 0.3–1.2)
BUN: 38 mg/dL — AB (ref 6–20)
CALCIUM: 8.2 mg/dL — AB (ref 8.9–10.3)
CO2: 29 mmol/L (ref 22–32)
Chloride: 102 mmol/L (ref 101–111)
Creatinine, Ser: 1.49 mg/dL — ABNORMAL HIGH (ref 0.61–1.24)
GFR calc Af Amer: 48 mL/min — ABNORMAL LOW (ref >60)
GFR calc non Af Amer: 42 mL/min — ABNORMAL LOW (ref >60)
GLUCOSE: 103 mg/dL — AB (ref 65–99)
POTASSIUM: 4.5 mmol/L (ref 3.5–5.1)
SODIUM: 138 mmol/L (ref 135–145)
TOTAL PROTEIN: 5.1 g/dL — AB (ref 6.5–8.1)

## 2015-05-15 LAB — CBC WITH DIFFERENTIAL/PLATELET
BASOS ABS: 0 10*3/uL (ref 0–0.1)
BASOS PCT: 0 %
EOS ABS: 0 10*3/uL (ref 0–0.7)
Eosinophils Relative: 0 %
HEMATOCRIT: 26.1 % — AB (ref 40.0–52.0)
HEMOGLOBIN: 8.9 g/dL — AB (ref 13.0–18.0)
LYMPHS ABS: 0.4 10*3/uL — AB (ref 1.0–3.6)
LYMPHS PCT: 4 %
MCH: 37 pg — ABNORMAL HIGH (ref 26.0–34.0)
MCHC: 34.2 g/dL (ref 32.0–36.0)
MCV: 108.2 fL — ABNORMAL HIGH (ref 80.0–100.0)
Monocytes Absolute: 0.5 10*3/uL (ref 0.2–1.0)
Monocytes Relative: 5 %
NEUTROS ABS: 8.7 10*3/uL — AB (ref 1.4–6.5)
Neutrophils Relative %: 91 %
Platelets: 228 10*3/uL (ref 150–440)
RBC: 2.41 MIL/uL — AB (ref 4.40–5.90)
RDW: 15.4 % — ABNORMAL HIGH (ref 11.5–14.5)
WBC: 9.6 10*3/uL (ref 3.8–10.6)

## 2015-05-15 LAB — PROTIME-INR
INR: 4.18
PROTHROMBIN TIME: 38.8 s — AB (ref 11.4–15.0)

## 2015-05-15 LAB — MRSA PCR SCREENING: MRSA BY PCR: NEGATIVE

## 2015-05-15 LAB — GLUCOSE, CAPILLARY: Glucose-Capillary: 100 mg/dL — ABNORMAL HIGH (ref 65–99)

## 2015-05-15 LAB — ABO/RH: ABO/RH(D): A POS

## 2015-05-15 MED ORDER — METOPROLOL SUCCINATE ER 50 MG PO TB24
100.0000 mg | ORAL_TABLET | ORAL | Status: DC
  Administered 2015-05-16 – 2015-05-21 (×4): 100 mg via ORAL
  Filled 2015-05-15 (×6): qty 2

## 2015-05-15 MED ORDER — MOMETASONE FURO-FORMOTEROL FUM 100-5 MCG/ACT IN AERO
2.0000 | INHALATION_SPRAY | Freq: Two times a day (BID) | RESPIRATORY_TRACT | Status: DC
  Administered 2015-05-15 – 2015-05-23 (×16): 2 via RESPIRATORY_TRACT
  Filled 2015-05-15 (×2): qty 8.8

## 2015-05-15 MED ORDER — SODIUM CHLORIDE 0.9 % IV SOLN
10.0000 mL/h | Freq: Once | INTRAVENOUS | Status: AC
  Administered 2015-05-18: 13:00:00 via INTRAVENOUS

## 2015-05-15 MED ORDER — IPRATROPIUM-ALBUTEROL 0.5-2.5 (3) MG/3ML IN SOLN: 3.0000 mL | RESPIRATORY_TRACT | Status: DC | PRN

## 2015-05-15 MED ORDER — PREDNISONE 10 MG PO TABS
10.0000 mg | ORAL_TABLET | Freq: Every day | ORAL | Status: DC
  Administered 2015-05-16 – 2015-05-23 (×8): 10 mg via ORAL
  Filled 2015-05-15 (×8): qty 1

## 2015-05-15 MED ORDER — TRAZODONE HCL 50 MG PO TABS
50.0000 mg | ORAL_TABLET | Freq: Every evening | ORAL | Status: DC | PRN
  Administered 2015-05-17 – 2015-05-21 (×5): 50 mg via ORAL
  Filled 2015-05-15 (×6): qty 1

## 2015-05-15 MED ORDER — FUROSEMIDE 40 MG PO TABS
20.0000 mg | ORAL_TABLET | Freq: Every day | ORAL | Status: DC
  Administered 2015-05-16 – 2015-05-20 (×5): 20 mg via ORAL
  Filled 2015-05-15 (×2): qty 1
  Filled 2015-05-15: qty 2
  Filled 2015-05-15 (×2): qty 1

## 2015-05-15 MED ORDER — ONDANSETRON HCL 4 MG/2ML IJ SOLN
4.0000 mg | Freq: Four times a day (QID) | INTRAMUSCULAR | Status: DC | PRN
  Administered 2015-05-22: 4 mg via INTRAVENOUS
  Filled 2015-05-15: qty 2

## 2015-05-15 MED ORDER — OXYCODONE HCL 5 MG PO TABS
5.0000 mg | ORAL_TABLET | ORAL | Status: DC | PRN
  Administered 2015-05-18 – 2015-05-23 (×7): 5 mg via ORAL
  Filled 2015-05-15 (×7): qty 1

## 2015-05-15 MED ORDER — ACETAMINOPHEN 650 MG RE SUPP: 650.0000 mg | Freq: Four times a day (QID) | RECTAL | Status: DC | PRN

## 2015-05-15 MED ORDER — ATORVASTATIN CALCIUM 20 MG PO TABS
80.0000 mg | ORAL_TABLET | Freq: Every evening | ORAL | Status: DC
  Administered 2015-05-16 – 2015-05-23 (×6): 80 mg via ORAL
  Filled 2015-05-15 (×6): qty 4

## 2015-05-15 MED ORDER — SALINE SPRAY 0.65 % NA SOLN
1.0000 | NASAL | Status: DC | PRN
  Filled 2015-05-15: qty 44

## 2015-05-15 MED ORDER — VITAMIN K1 10 MG/ML IJ SOLN
10.0000 mg | Freq: Once | INTRAMUSCULAR | Status: AC
  Administered 2015-05-15: 10 mg via INTRAVENOUS
  Filled 2015-05-15: qty 1

## 2015-05-15 MED ORDER — MORPHINE SULFATE (CONCENTRATE) 20 MG/ML PO SOLN: 5.0000 mg | ORAL | Status: DC | PRN

## 2015-05-15 MED ORDER — AMIODARONE HCL 200 MG PO TABS
200.0000 mg | ORAL_TABLET | Freq: Every day | ORAL | Status: DC
  Administered 2015-05-16 – 2015-05-23 (×8): 200 mg via ORAL
  Filled 2015-05-15 (×8): qty 1

## 2015-05-15 MED ORDER — DILTIAZEM HCL ER COATED BEADS 180 MG PO CP24
360.0000 mg | ORAL_CAPSULE | ORAL | Status: DC
  Administered 2015-05-16 – 2015-05-21 (×4): 360 mg via ORAL
  Filled 2015-05-15 (×6): qty 2

## 2015-05-15 MED ORDER — SODIUM CHLORIDE 0.9 % IV BOLUS (SEPSIS)
500.0000 mL | Freq: Once | INTRAVENOUS | Status: AC
  Administered 2015-05-15: 500 mL via INTRAVENOUS

## 2015-05-15 MED ORDER — TIOTROPIUM BROMIDE MONOHYDRATE 18 MCG IN CAPS
18.0000 ug | ORAL_CAPSULE | Freq: Every day | RESPIRATORY_TRACT | Status: DC
  Administered 2015-05-16 – 2015-05-23 (×8): 18 ug via RESPIRATORY_TRACT
  Filled 2015-05-15 (×2): qty 5

## 2015-05-15 MED ORDER — SODIUM CHLORIDE 0.9% FLUSH
3.0000 mL | Freq: Two times a day (BID) | INTRAVENOUS | Status: DC
  Administered 2015-05-16 – 2015-05-23 (×7): 3 mL via INTRAVENOUS

## 2015-05-15 MED ORDER — ONDANSETRON HCL 4 MG PO TABS: 4.0000 mg | ORAL_TABLET | Freq: Four times a day (QID) | ORAL | Status: DC | PRN

## 2015-05-15 MED ORDER — MORPHINE SULFATE (CONCENTRATE) 10 MG/0.5ML PO SOLN
5.0000 mg | ORAL | Status: DC | PRN
  Administered 2015-05-21: 5 mg via ORAL
  Filled 2015-05-15: qty 1

## 2015-05-15 MED ORDER — ACETAMINOPHEN 325 MG PO TABS: 650.0000 mg | ORAL_TABLET | Freq: Four times a day (QID) | ORAL | Status: DC | PRN

## 2015-05-15 MED ORDER — POTASSIUM CHLORIDE CRYS ER 20 MEQ PO TBCR
40.0000 meq | EXTENDED_RELEASE_TABLET | Freq: Every day | ORAL | Status: DC
  Administered 2015-05-16 – 2015-05-23 (×8): 40 meq via ORAL
  Filled 2015-05-15 (×8): qty 2

## 2015-05-15 MED ORDER — CETYLPYRIDINIUM CHLORIDE 0.05 % MT LIQD
7.0000 mL | Freq: Two times a day (BID) | OROMUCOSAL | Status: DC
  Administered 2015-05-15 – 2015-05-23 (×11): 7 mL via OROMUCOSAL

## 2015-05-15 MED ORDER — SODIUM CHLORIDE 0.9 % IV SOLN
INTRAVENOUS | Status: DC
  Administered 2015-05-15 – 2015-05-21 (×5): via INTRAVENOUS

## 2015-05-15 MED ORDER — LORAZEPAM 0.5 MG PO TABS: 0.2500 mg | ORAL_TABLET | ORAL | Status: DC | PRN

## 2015-05-15 MED ORDER — AMOXICILLIN-POT CLAVULANATE 500-125 MG PO TABS
1.0000 | ORAL_TABLET | Freq: Three times a day (TID) | ORAL | Status: DC
  Administered 2015-05-15 – 2015-05-23 (×23): 500 mg via ORAL
  Filled 2015-05-15 (×25): qty 1

## 2015-05-15 MED ORDER — GABAPENTIN 300 MG PO CAPS
300.0000 mg | ORAL_CAPSULE | Freq: Two times a day (BID) | ORAL | Status: DC
  Administered 2015-05-15 – 2015-05-23 (×16): 300 mg via ORAL
  Filled 2015-05-15 (×17): qty 1

## 2015-05-15 MED ORDER — COLLAGENASE 250 UNIT/GM EX OINT
1.0000 "application " | TOPICAL_OINTMENT | Freq: Two times a day (BID) | CUTANEOUS | Status: DC
  Administered 2015-05-15 – 2015-05-23 (×12): 1 via TOPICAL
  Filled 2015-05-15 (×4): qty 30

## 2015-05-15 NOTE — ED Notes (Signed)
MD at bedside. 

## 2015-05-15 NOTE — ED Notes (Signed)
MD Shaevitz at bedside. 

## 2015-05-15 NOTE — ED Notes (Signed)
Pt arrived by EMS from [facility] with c/o Rectal Bleeding. EMS states bedside commode was "half full". Staff stated that Pt's BP was "80/something earlier today". Daughter states BP usually runs low. Upon EMS arrival BP was 102/76. Upon ED arrival BP was 100/74.

## 2015-05-15 NOTE — ED Provider Notes (Signed)
[facility] Emergency Department Provider Note  ____________________________________________  Time seen: Seen upon arrival to the emergency department  I have reviewed the triage vital signs and the nursing notes.   HISTORY  Chief Complaint Rectal Bleeding    HPI Chad Kirby is a 82 y.o. male with a history of atrial fibrillation on Coumadin who is presenting to the emergency department today with rectal bleeding. Per EMS, he had one large episode of maroon bleeding from his rectum. The patient denies any abdominal pain. No nausea or vomiting. Says that he has never had bleeding like this before. Denies any lightheadedness.   Past Medical History  Diagnosis Date  . COPD (chronic obstructive pulmonary disease) ([place])   . Cancer [facility])     Prostate with bone mets-current chemo treatment  . Hyperlipidemia   . Atrial fibrillation [facility])     Patient Active Problem List   Diagnosis Date Noted  . Cellulitis of left lower extremity   . Atrial fibrillation with RVR ([place]) 05/01/2015  . Sepsis ([place]) 05/01/2015  . Cellulitis and abscess of leg 05/01/2015    Past Surgical History  Procedure Laterality Date  . Replacement total knee Right     Current Outpatient Rx  Name  Route  Sig  Dispense  Refill  . acetaminophen (TYLENOL) 500 MG tablet   Oral   Take 500 mg by mouth every 6 (six) hours as needed. For pain.         Marland Kitchen albuterol (PROVENTIL) (2.5 MG/3ML) 0.083% nebulizer solution   Nebulization   Take 3 mLs (2.5 mg total) by nebulization every 4 (four) hours as needed for wheezing or shortness of breath.   75 mL   12   . amiodarone (PACERONE) 200 MG tablet   Oral   Take 1 tablet (200 mg total) by mouth daily.   30 tablet   0   . amoxicillin-clavulanate (AUGMENTIN) 500-125 MG tablet   Oral   Take 1 tablet (500 mg total) by mouth 3 (three) times daily.   12 tablet   0   . atorvastatin (LIPITOR) 80 MG tablet   Oral   Take 80 mg by mouth  every evening.      1   . budesonide-formoterol (SYMBICORT) 80-4.5 MCG/ACT inhaler   Inhalation   Inhale 2 puffs into the lungs 2 (two) times daily.         . Calcium Carbonate-Vitamin D 600-400 MG-UNIT tablet   Oral   Take 2 tablets by mouth 2 (two) times daily.         . collagenase (SANTYL) ointment   Topical   Apply 1 application topically 2 (two) times daily. Apply a thick layer to posterior left lower leg, cover with NS damp to dry gauze, wrap with kerlix         . diltiazem (CARDIZEM CD) 180 MG 24 hr capsule   Oral   Take 360 mg by mouth every morning.      1   . furosemide (LASIX) 20 MG tablet   Oral   Take 20 mg by mouth daily.         Marland Kitchen gabapentin (NEURONTIN) 300 MG capsule   Oral   Take 1 capsule (300 mg total) by mouth 2 (two) times daily.   60 capsule   0   . LORazepam (ATIVAN) 0.5 MG tablet   Oral   Take 0.25 mg by mouth every 4 (four) hours as needed for anxiety.         Marland Kitchen  metoprolol succinate (TOPROL-XL) 100 MG 24 hr tablet   Oral   Take 100 mg by mouth every morning.      1   . morphine (ROXANOL) 20 MG/ML concentrated solution   Oral   Take 5-10 mg by mouth every hour as needed for severe pain.         Marland Kitchen oxyCODONE (OXY IR/ROXICODONE) 5 MG immediate release tablet   Oral   Take 5-10 mg by mouth every 4 (four) hours as needed for moderate pain or severe pain.         . polyethylene glycol (MIRALAX / GLYCOLAX) packet   Oral   Take 17 g by mouth 2 (two) times daily.   14 each   0   . potassium chloride SA (K-DUR,KLOR-CON) 20 MEQ tablet   Oral   Take 40 mEq by mouth daily.         . predniSONE (DELTASONE) 10 MG tablet   Oral   Take 10 mg by mouth daily.      9   . prochlorperazine (COMPAZINE) 10 MG tablet   Oral   Take 10 mg by mouth every 6 (six) hours as needed. For nausea and vomiting.         . senna-docusate (SENOKOT-S) 8.6-50 MG tablet   Oral   Take 2 tablets by mouth 2 (two) times daily.         . sodium  chloride (OCEAN) 0.65 % SOLN nasal spray   Each Nare   Place 1 spray into both nostrils as needed for congestion.         Marland Kitchen tiotropium (SPIRIVA) 18 MCG inhalation capsule   Inhalation   Place 18 mcg into inhaler and inhale daily.         . traZODone (DESYREL) 50 MG tablet   Oral   Take 50 mg by mouth at bedtime as needed for sleep.         Marland Kitchen warfarin (COUMADIN) 2 MG tablet   Oral   Take 2 mg by mouth daily.         Marland Kitchen Leuprolide Acetate, 6 Month, (LUPRON) 45 MG injection   Intramuscular   Inject 45 mg into the muscle every 6 (six) months.           Allergies Latex  Family History  Problem Relation Age of Onset  . Brain cancer Sister   . Stroke Father     Social History Social History  Substance Use Topics  . Smoking status: Former Research scientist (life sciences)  . Smokeless tobacco: None  . Alcohol Use: 0.0 oz/week    0 Standard drinks or equivalent per week     Comment: occasional    Review of Systems Constitutional: No fever/chills Eyes: No visual changes. ENT: No sore throat. Cardiovascular: Denies chest pain. Respiratory: Denies shortness of breath. Gastrointestinal: No abdominal pain.  No nausea, no vomiting.  No diarrhea.  No constipation. Genitourinary: Negative for dysuria. Musculoskeletal: Negative for back pain. Skin: Negative for rash. Neurological: Negative for headaches, focal weakness or numbness.  10-point ROS otherwise negative.  ____________________________________________   PHYSICAL EXAM:  VITAL SIGNS: ED Triage Vitals  Enc Vitals Group     BP 05/15/15 1808 100/74 mmHg     Pulse Rate 05/15/15 1808 67     Resp 05/15/15 1808 22     Temp 05/15/15 1808 97.7 F (36.5 C)     Temp Source 05/15/15 1808 Oral     SpO2 05/15/15 1801 94 %  Weight 05/15/15 1808 220 lb (99.791 kg)     Height 05/15/15 1808 6\' 1"  (1.854 m)     Head Cir --      Peak Flow --      Pain Score --      Pain Loc --      Pain Edu? --      Excl. in [place]? --      Constitutional: Alert and oriented. Well appearing and in no acute distress. Eyes: Conjunctivae are normal. PERRL. EOMI. Head: Atraumatic. Nose: No congestion/rhinnorhea. Patient wearing nasal cannula oxygen which is his baseline. Mouth/Throat: Mucous membranes are moist.  Neck: No stridor.   Cardiovascular: Normal rate, regular rhythm. Grossly normal heart sounds.  Good peripheral circulation. Respiratory: Normal respiratory effort.  No retractions. Lungs CTAB. Gastrointestinal: Soft and nontender. No distention. No CVA tenderness. maroon stool surrounding the external sphincter and extending on to the bilateral buttocks.  Musculoskeletal: No lower extremity tenderness nor edema.  No joint effusions. Neurologic:  Normal speech and language. No gross focal neurologic deficits are appreciated. No gait instability. Skin:  Skin is warm, dry and intact. No rash noted. Psychiatric: Mood and affect are normal. Speech and behavior are normal.  ____________________________________________   LABS (all labs ordered are listed, but only abnormal results are displayed)  Labs Reviewed  CBC WITH DIFFERENTIAL/PLATELET - Abnormal; Notable for the following:    RBC 2.41 (*)    Hemoglobin 8.9 (*)    HCT 26.1 (*)    MCV 108.2 (*)    MCH 37.0 (*)    RDW 15.4 (*)    Neutro Abs 8.7 (*)    Lymphs Abs 0.4 (*)    All other components within normal limits  COMPREHENSIVE METABOLIC PANEL - Abnormal; Notable for the following:    Glucose, Bld 103 (*)    BUN 38 (*)    Creatinine, Ser 1.49 (*)    Calcium 8.2 (*)    Total Protein 5.1 (*)    Albumin 2.5 (*)    Alkaline Phosphatase 139 (*)    GFR calc non Af Amer 42 (*)    GFR calc Af Amer 48 (*)    All other components within normal limits  PROTIME-INR - Abnormal; Notable for the following:    Prothrombin Time 38.8 (*)    INR 4.18 (*)    All other components within normal limits  TYPE AND SCREEN  ABO/RH  PREPARE FRESH FROZEN PLASMA    ____________________________________________  EKG   ____________________________________________  RADIOLOGY   ____________________________________________   PROCEDURES  CRITICAL CARE Performed by: Doran Stabler   Total critical care time: 35 minutes  Critical care time was exclusive of separately billable procedures and treating other patients.  Critical care was necessary to treat or prevent imminent or life-threatening deterioration.  Critical care was time spent personally by me on the following activities: development of treatment plan with patient and/or surrogate as well as nursing, discussions with consultants, evaluation of patient's response to treatment, examination of patient, obtaining history from patient or surrogate, ordering and performing treatments and interventions, ordering and review of laboratory studies, ordering and review of radiographic studies, pulse oximetry and re-evaluation of patient's condition.  ____________________________________________   INITIAL IMPRESSION / ASSESSMENT AND PLAN / ED COURSE  Pertinent labs & imaging results that were available during my care of the patient were reviewed by me and considered in my medical decision making (see chart for details).  ----------------------------------------- 7:40 PM on 05/15/2015 -----------------------------------------  Patient resting comfortably without any further episodes of bleeding. Found to have INR in the fours. We'll give vitamin K as well as FFP. Signed out to Dr. Darvin Neighbours for [facility] to the hospital. Family updated about the lab results as well as need for admission to the hospital and are willing to comply. ____________________________________________   FINAL CLINICAL IMPRESSION(S) / ED DIAGNOSES  Acute GI bleeding. Coumadin coagulopathy.     Orbie Pyo, MD 05/15/15 [phone]

## 2015-05-15 NOTE — H&P (Signed)
[place] [facility] [place] NAME: Chad Kirby    MR#:  [id number]  DATE OF BIRTH:  1929-05-30  DATE OF ADMISSION:  05/15/2015  PRIMARY CARE PHYSICIAN: No primary care provider on file.   REQUESTING/REFERRING PHYSICIAN: Dr. Dineen Kid  CHIEF COMPLAINT:   Chief Complaint  Patient presents with  . Rectal Bleeding    HISTORY OF PRESENT ILLNESS:  Chad Kirby  is a 82 y.o. male with a known history of COPD, atrial fibrillation on Coumadin, prostate cancer with bone metastases and recent admission for ileus and lower extremity cellulitis returns to the emergency room due to large amount of bright red blood per rectum one episode. Patient left the hospital on 05/06/2015 and as per patient and family has not had a bowel movement since then. Today he had very little stool but large amount of bright red blood with some clots. Daughter took a picture with her mobile phone which is available and I looked at it. Patient does feel lightheaded and dizzy on standing up and extremely weak. Has not been eating well. Does not complain of any abdominal pain. Had some nausea. No vomiting. Was started on Lasix recently by Dr. Tomasita Crumble shows for congestive heart failure. Patient is on Coumadin and his INR is 4.5 today. Hemoglobin is 8.9 and was 13 months back.  PAST MEDICAL HISTORY:   Past Medical History  Diagnosis Date  . COPD (chronic obstructive pulmonary disease) ([place])   . Cancer [facility])     Prostate with bone mets-current chemo treatment  . Hyperlipidemia   . Atrial fibrillation ([place])     PAST SURGICAL HISTORY:   Past Surgical History  Procedure Laterality Date  . Replacement total knee Right     SOCIAL HISTORY:   Social History  Substance Use Topics  . Smoking status: Former Research scientist (life sciences)  . Smokeless tobacco: Not on file  . Alcohol Use: 0.0 oz/week    0 Standard drinks or equivalent per week     Comment: occasional    FAMILY HISTORY:   Family  History  Problem Relation Age of Onset  . Brain cancer Sister   . Stroke Father     DRUG ALLERGIES:   Allergies  Allergen Reactions  . Latex Rash    REVIEW OF SYSTEMS:   Review of Systems  Constitutional: Positive for malaise/fatigue. Negative for fever, chills and weight loss.  HENT: Negative for hearing loss and nosebleeds.   Eyes: Negative for blurred vision, double vision and pain.  Respiratory: Negative for cough, hemoptysis, sputum production, shortness of breath and wheezing.   Cardiovascular: Negative for chest pain, palpitations, orthopnea and leg swelling.  Gastrointestinal: Positive for nausea, constipation and blood in stool. Negative for vomiting, abdominal pain and diarrhea.  Genitourinary: Negative for dysuria and hematuria.  Musculoskeletal: Negative for myalgias, back pain and falls.  Skin: Positive for rash.  Neurological: Positive for weakness. Negative for dizziness, tremors, sensory change, speech change, focal weakness, seizures and headaches.  Endo/Heme/Allergies: Does not bruise/bleed easily.  Psychiatric/Behavioral: Negative for depression and memory loss. The patient is not nervous/anxious.     MEDICATIONS AT HOME:   Prior to Admission medications   Medication Sig Start Date End Date Taking? Authorizing Provider  acetaminophen (TYLENOL) 500 MG tablet Take 500 mg by mouth every 6 (six) hours as needed. For pain.   Yes Historical Provider, MD  albuterol (PROVENTIL) (2.5 MG/3ML) 0.083% nebulizer solution Take 3 mLs (2.5 mg total) by nebulization every 4 (four)  hours as needed for wheezing or shortness of breath. 05/06/15  Yes [name], MD  amiodarone (PACERONE) 200 MG tablet Take 1 tablet (200 mg total) by mouth daily. 05/06/15  Yes [name], MD  amoxicillin-clavulanate (AUGMENTIN) 500-125 MG tablet Take 1 tablet (500 mg total) by mouth 3 (three) times daily. 05/06/15  Yes [name], MD  atorvastatin (LIPITOR) 80 MG tablet Take 80 mg by mouth every  evening. 04/26/15  Yes Historical Provider, MD  budesonide-formoterol (SYMBICORT) 80-4.5 MCG/ACT inhaler Inhale 2 puffs into the lungs 2 (two) times daily.   Yes Historical Provider, MD  Calcium Carbonate-Vitamin D 600-400 MG-UNIT tablet Take 2 tablets by mouth 2 (two) times daily. 12/15/09  Yes Historical Provider, MD  collagenase (SANTYL) ointment Apply 1 application topically 2 (two) times daily. Apply a thick layer to posterior left lower leg, cover with NS damp to dry gauze, wrap with kerlix   Yes Historical Provider, MD  diltiazem (CARDIZEM CD) 180 MG 24 hr capsule Take 360 mg by mouth every morning. 04/26/15  Yes Historical Provider, MD  furosemide (LASIX) 20 MG tablet Take 20 mg by mouth daily.   Yes Historical Provider, MD  gabapentin (NEURONTIN) 300 MG capsule Take 1 capsule (300 mg total) by mouth 2 (two) times daily. 05/06/15  Yes [name], MD  LORazepam (ATIVAN) 0.5 MG tablet Take 0.25 mg by mouth every 4 (four) hours as needed for anxiety.   Yes Historical Provider, MD  metoprolol succinate (TOPROL-XL) 100 MG 24 hr tablet Take 100 mg by mouth every morning. 04/26/15  Yes Historical Provider, MD  morphine (ROXANOL) 20 MG/ML concentrated solution Take 5-10 mg by mouth every hour as needed for severe pain.   Yes Historical Provider, MD  oxyCODONE (OXY IR/ROXICODONE) 5 MG immediate release tablet Take 5-10 mg by mouth every 4 (four) hours as needed for moderate pain or severe pain.   Yes Historical Provider, MD  polyethylene glycol (MIRALAX / GLYCOLAX) packet Take 17 g by mouth 2 (two) times daily. 05/06/15  Yes [name], MD  potassium chloride SA (K-DUR,KLOR-CON) 20 MEQ tablet Take 40 mEq by mouth daily.   Yes Historical Provider, MD  predniSONE (DELTASONE) 10 MG tablet Take 10 mg by mouth daily. 04/14/15  Yes Historical Provider, MD  prochlorperazine (COMPAZINE) 10 MG tablet Take 10 mg by mouth every 6 (six) hours as needed. For nausea and vomiting. 04/14/15  Yes Historical Provider, MD   senna-docusate (SENOKOT-S) 8.6-50 MG tablet Take 2 tablets by mouth 2 (two) times daily. 05/06/15  Yes [name], MD  sodium chloride (OCEAN) 0.65 % SOLN nasal spray Place 1 spray into both nostrils as needed for congestion.   Yes Historical Provider, MD  tiotropium (SPIRIVA) 18 MCG inhalation capsule Place 18 mcg into inhaler and inhale daily.   Yes Historical Provider, MD  traZODone (DESYREL) 50 MG tablet Take 50 mg by mouth at bedtime as needed for sleep.   Yes Historical Provider, MD  warfarin (COUMADIN) 2 MG tablet Take 2 mg by mouth daily.   Yes Historical Provider, MD  Leuprolide Acetate, 6 Month, (LUPRON) 45 MG injection Inject 45 mg into the muscle every 6 (six) months.    Historical Provider, MD     VITAL SIGNS:  Blood pressure 104/74, pulse 88, temperature 97.7 F (36.5 C), temperature source Oral, resp. rate 18, height 6\' 1"  (1.854 m), weight 99.791 kg (220 lb), SpO2 94 %.  PHYSICAL EXAMINATION:  Physical Exam  GENERAL:  82 y.o.-year-old patient lying in  the bed with no acute distress. Looks pale EYES: Pupils equal, round, reactive to light and accommodation. No scleral icterus. Extraocular muscles intact.  HEENT: Head atraumatic, normocephalic. Oropharynx and nasopharynx clear. No oropharyngeal erythema, moist oral dry NECK:  Supple, no jugular venous distention. No thyroid enlargement, no tenderness.  LUNGS: Normal breath sounds bilaterally, no wheezing, rales, rhonchi. No use of accessory muscles of respiration.  CARDIOVASCULAR: S1, S2 normal. No murmurs, rubs, or gallops. Irregular ABDOMEN: Soft, nontender, nondistended. Bowel sounds present. No organomegaly or mass.  EXTREMITIES: No pedal edema, cyanosis, or clubbing. + 2 pedal & radial pulses b/l.   NEUROLOGIC: Cranial nerves II through XII are intact. No focal Motor or sensory deficits appreciated b/l PSYCHIATRIC: The patient is alert and oriented x 3. Good affect.  SKIN: Lower extremity pressure ulcers. Bruising on all  extremities.  LABORATORY PANEL:   CBC  Recent Labs Lab 05/15/15 1810  WBC 9.6  HGB 8.9*  HCT 26.1*  PLT 228   ------------------------------------------------------------------------------------------------------------------  Chemistries   Recent Labs Lab 05/11/15 0600  05/15/15 1810  NA 134*  < > 138  K 3.4*  < > 4.5  CL 99*  < > 102  CO2 28  < > 29  GLUCOSE 90  < > 103*  BUN 19  < > 38*  CREATININE 1.11  < > 1.49*  CALCIUM 8.5*  < > 8.2*  MG 1.7  --   --   AST 28  < > 26  ALT 26  < > 19  ALKPHOS 107  < > 139*  BILITOT 0.8  < > 1.1  < > = values in this interval not displayed. ------------------------------------------------------------------------------------------------------------------  Cardiac Enzymes No results for input(s): TROPONINI in the last 168 hours. ------------------------------------------------------------------------------------------------------------------  RADIOLOGY:  No results found.   IMPRESSION AND PLAN:   * Lower GI bleed with INR 4.5 due to Coumadin and acute blood loss anemia Large amount of blood with drop in hemoglobin and hypotension. Patient is also symptomatic with dizziness on standing up. Presently hemoglobin 8.9. Reverse Coumadin with FFP and vitamin K which have been ordered. Will repeat hemoglobin in 4 hours. The consent for transfusion from patient and family. Transfuse as needed. Consult GI for colonoscopy.  * Atrial fibrillation Continue rate control medications. Hold Coumadin. Discussed with patient and family regarding risk versus benefit and reason for stopping Coumadin at this point.  * CKD stage III  * Prostate cancer with bone metastases. Follow-up with oncology as outpatient.  * COPD is stable. Continue inhalers. Nebulizers when necessary.  * Chronic lower extremity pressure ulcers. Continue collagenase that is being applied as outpatient. Continue Augmentin. Wound care consult.  * DVT prophylaxis.  Present the INR 4.5. No heparin or Lovenox once reversed due to GI bleed.  All the records are reviewed and case discussed with ED provider. Management plans discussed with the patient, family and they are in agreement.  CODE STATUS: DNR  TOTAL CC TIME TAKING CARE OF THIS PATIENT: 40 minutes.   Hillary Bow R M.D on 05/15/2015 at 7:59 PM  Between 7am to 6pm - Pager - [phone]  After 6pm go to [email or web address] - password [email or web address] [facility]  [place] [facility]  Office  [phone]  CC: Primary care physician; No primary care provider on file.  Note: This dictation was prepared with Dragon dictation along with smaller phrase technology. Any transcriptional errors that result from this process are unintentional.

## 2015-05-15 NOTE — ED Notes (Signed)
Pt has Peripheral IV in Left Arm that was placed prior to hospital arrival. IV was not placed by EMS but most likely [employer].

## 2015-05-16 ENCOUNTER — Inpatient Hospital Stay: Payer: Medicare Other

## 2015-05-16 LAB — HEMOGLOBIN AND HEMATOCRIT, BLOOD
HCT: 23.9 % — ABNORMAL LOW (ref 40.0–52.0)
HEMATOCRIT: 25.4 % — AB (ref 40.0–52.0)
HEMATOCRIT: 26.8 % — AB (ref 40.0–52.0)
HEMATOCRIT: 25.3 % — AB (ref 40.0–52.0)
HEMOGLOBIN: 8.7 g/dL — AB (ref 13.0–18.0)
HEMOGLOBIN: 9.2 g/dL — AB (ref 13.0–18.0)
HEMOGLOBIN: 8.7 g/dL — AB (ref 13.0–18.0)
Hemoglobin: 8.1 g/dL — ABNORMAL LOW (ref 13.0–18.0)

## 2015-05-16 LAB — BASIC METABOLIC PANEL
ANION GAP: 4 — AB (ref 5–15)
BUN: 35 mg/dL — AB (ref 6–20)
CALCIUM: 8.2 mg/dL — AB (ref 8.9–10.3)
CO2: 29 mmol/L (ref 22–32)
Chloride: 107 mmol/L (ref 101–111)
Creatinine, Ser: 1.28 mg/dL — ABNORMAL HIGH (ref 0.61–1.24)
GFR calc Af Amer: 58 mL/min — ABNORMAL LOW (ref >60)
GFR calc non Af Amer: 50 mL/min — ABNORMAL LOW (ref >60)
GLUCOSE: 85 mg/dL (ref 65–99)
Potassium: 4 mmol/L (ref 3.5–5.1)
Sodium: 140 mmol/L (ref 135–145)

## 2015-05-16 LAB — CBC
HEMATOCRIT: 21.7 % — AB (ref 40.0–52.0)
HEMOGLOBIN: 7.4 g/dL — AB (ref 13.0–18.0)
MCH: 36.4 pg — AB (ref 26.0–34.0)
MCHC: 34 g/dL (ref 32.0–36.0)
MCV: 107.2 fL — ABNORMAL HIGH (ref 80.0–100.0)
Platelets: 203 10*3/uL (ref 150–440)
RBC: 2.03 MIL/uL — ABNORMAL LOW (ref 4.40–5.90)
RDW: 15 % — AB (ref 11.5–14.5)
WBC: 8 10*3/uL (ref 3.8–10.6)

## 2015-05-16 LAB — PROTIME-INR
INR: 1.47
PROTHROMBIN TIME: 17.9 s — AB (ref 11.4–15.0)

## 2015-05-16 LAB — PREPARE RBC (CROSSMATCH)

## 2015-05-16 MED ORDER — PEG 3350-KCL-NA BICARB-NACL 420 G PO SOLR
4000.0000 mL | Freq: Once | ORAL | Status: AC
  Administered 2015-05-17: 4000 mL via ORAL
  Filled 2015-05-16 (×2): qty 4000

## 2015-05-16 MED ORDER — SODIUM CHLORIDE 0.9 % IV SOLN
Freq: Once | INTRAVENOUS | Status: AC
  Administered 2015-05-16: 03:00:00 via INTRAVENOUS

## 2015-05-16 NOTE — NC FL2 (Signed)
[facility] LEVEL OF CARE SCREENING TOOL     IDENTIFICATION  Patient Name: Chad Kirby Birthdate: Feb 05, 1930 Sex: male Admission Date (Current Location): 05/15/2015  [facility] and [place] Number:  Engineering geologist and Address:  [facility], [address], [place], [place] [address]      Provider Number: [id number]  Attending Physician Name and Address:  [name], MD  Relative Name and Phone Number:       Current Level of Care: Hospital Recommended Level of Care: [place] Prior Approval Number:    Date Approved/Denied:   PASRR Number: existing  Discharge Plan: SNF    Current Diagnoses: Patient Active Problem List   Diagnosis Date Noted  . Lower GI bleed 05/15/2015  . Cellulitis of left lower extremity   . Atrial fibrillation with RVR ([place]) 05/01/2015  . Sepsis ([place]) 05/01/2015  . Cellulitis and abscess of leg 05/01/2015    Orientation RESPIRATION BLADDER Height & Weight     Self, Time, Situation, Place  Normal, O2 (2 liters) Continent Weight: 229 lb 4.5 oz (104 kg) Height:  6\' 1"  (185.4 cm)  BEHAVIORAL SYMPTOMS/MOOD NEUROLOGICAL BOWEL NUTRITION STATUS   (none)  (none) Continent Diet (currently npo)  AMBULATORY STATUS COMMUNICATION OF NEEDS Skin   Extensive Assist Verbally Normal                       Personal Care Assistance Level of Assistance  Bathing, Dressing Bathing Assistance: Limited assistance   Dressing Assistance: Limited assistance     Functional Limitations Info  Hearing   Hearing Info: Impaired      SPECIAL CARE FACTORS FREQUENCY  PT (By licensed PT)                    Contractures Contractures Info: Not present    Additional Factors Info  Allergies, Code Status Code Status Info: DNR Allergies Info: latex           Current Medications (05/16/2015):  This is the current hospital active medication list Current Facility-Administered Medications   Medication Dose Route Frequency Provider Last Rate Last Dose  . 0.9 %  sodium chloride infusion  10 mL/hr Intravenous Once Orbie Pyo, MD   10 mL/hr at 05/15/15 2230  . 0.9 %  sodium chloride infusion   Intravenous Continuous Srikar Sudini, MD 50 mL/hr at 05/16/15 1400    . acetaminophen (TYLENOL) tablet 650 mg  650 mg Oral Q6H PRN Hillary Bow, MD       Or  . acetaminophen (TYLENOL) suppository 650 mg  650 mg Rectal Q6H PRN Srikar Sudini, MD      . amiodarone (PACERONE) tablet 200 mg  200 mg Oral Daily Srikar Sudini, MD   200 mg at 05/16/15 1000  . amoxicillin-clavulanate (AUGMENTIN) 500-125 MG per tablet 500 mg  1 tablet Oral TID Hillary Bow, MD   500 mg at 05/16/15 0958  . antiseptic oral rinse (CPC / CETYLPYRIDINIUM CHLORIDE 0.05%) solution 7 mL  7 mL Mouth Rinse BID Hillary Bow, MD   7 mL at 05/16/15 1000  . atorvastatin (LIPITOR) tablet 80 mg  80 mg Oral QPM Srikar Sudini, MD      . collagenase (SANTYL) ointment 1 application  1 application Topical BID Hillary Bow, MD   1 application at [id number] 1000  . diltiazem (CARDIZEM CD) 24 hr capsule 360 mg  360 mg Oral BH-q7a Hillary Bow, MD   360 mg  at 05/16/15 0746  . furosemide (LASIX) tablet 20 mg  20 mg Oral Daily Hillary Bow, MD   20 mg at 05/16/15 1000  . gabapentin (NEURONTIN) capsule 300 mg  300 mg Oral BID Hillary Bow, MD   300 mg at 05/16/15 0949  . ipratropium-albuterol (DUONEB) 0.5-2.5 (3) MG/3ML nebulizer solution 3 mL  3 mL Nebulization Q4H PRN Srikar Sudini, MD      . LORazepam (ATIVAN) tablet 0.25 mg  0.25 mg Oral Q4H PRN Srikar Sudini, MD      . metoprolol succinate (TOPROL-XL) 24 hr tablet 100 mg  100 mg Oral Julianne Rice, MD   100 mg at 05/16/15 0746  . mometasone-formoterol (DULERA) 100-5 MCG/ACT inhaler 2 puff  2 puff Inhalation BID Hillary Bow, MD   2 puff at 05/16/15 0747  . morphine CONCENTRATE 10 MG/0.5ML oral solution 5 mg  5 mg Oral Q3H PRN Lenis Noon, RPH      . ondansetron  [facility]) tablet 4 mg  4 mg Oral Q6H PRN Hillary Bow, MD       Or  . ondansetron (ZOFRAN) injection 4 mg  4 mg Intravenous Q6H PRN Srikar Sudini, MD      . oxyCODONE (Oxy IR/ROXICODONE) immediate release tablet 5 mg  5 mg Oral Q4H PRN Srikar Sudini, MD      . Derrill Memo ON 05/17/2015] polyethylene glycol-electrolytes (NuLYTELY/GoLYTELY) solution 4,000 mL  4,000 mL Oral Once Ronney Asters, PA-C      . potassium chloride SA (K-DUR,KLOR-CON) CR tablet 40 mEq  40 mEq Oral Daily Hillary Bow, MD   40 mEq at 05/16/15 1000  . predniSONE (DELTASONE) tablet 10 mg  10 mg Oral Daily Hillary Bow, MD   10 mg at 05/16/15 0948  . sodium chloride (OCEAN) 0.65 % nasal spray 1 spray  1 spray Each Nare PRN Srikar Sudini, MD      . sodium chloride flush (NS) 0.9 % injection 3 mL  3 mL Intravenous Q12H Srikar Sudini, MD   3 mL at 05/16/15 1000  . tiotropium (SPIRIVA) inhalation capsule 18 mcg  18 mcg Inhalation Daily Hillary Bow, MD   18 mcg at 05/16/15 0747  . traZODone (DESYREL) tablet 50 mg  50 mg Oral QHS PRN Hillary Bow, MD         Discharge Medications: Please see discharge summary for a list of discharge medications.  Relevant Imaging Results:  Relevant Lab Results:   Additional Information    Shela Leff, LCSW

## 2015-05-16 NOTE — Consult Note (Signed)
GI Inpatient Consult Note  Reason for Consult: Lower GI Bleed   Attending Requesting Consult: Dr. Darvin Neighbours  History of Present Illness: Chad Kirby is a 82 y.o. male seen for evaluation of rectal bleeding at the request of Dr.Sudini.   PMhx of COPD, prostate cancer w/ bone mets s/p 1 infusion of chemo, HLD, atrial fib on coumadin. Recently hospitalized 2/26-3/3 for weakness, found to have ileus during that admission that resolved without decompression or neostigmine. During that visit Hgb was 11.   Family at bedside who helps w/ history. Reports extremely poor appetite and PO intake since mid Feb with hospitalization as above. Has [facility] since discharge and has had limited PO intake of mainly liquids and broth. Denies any solids PO intake in past 4 weeks. Since d/c on 3/3 he had only very small amounts of mucous. Denies passing regular stool. He reports some nausea post prandial but no abdominal pain. Acutely yesterday afternoon had large amount of maroon colored stool. Denies any abdominal pain with bleeding. No further episodes of bleeding since initial episode. His weight is down about 10lbs in the past month. He is unsure if uses narcotics on med list for pain control.    Last Colonoscopy: 30 years ago - per patient w/ polyps Last Endoscopy: 30 years ago - unsure of results   Past Medical History:  Past Medical History  Diagnosis Date  . COPD (chronic obstructive pulmonary disease) ([place])   . [facility])     Prostate with bone mets-current chemo treatment  . Hyperlipidemia   . Atrial fibrillation [facility])     Problem List: Patient Active Problem List   Diagnosis Date Noted  . Lower GI bleed 05/15/2015  . Cellulitis of left lower extremity   . Atrial fibrillation with RVR ([place]) 05/01/2015  . Sepsis ([place]) 05/01/2015  . Cellulitis and abscess of leg 05/01/2015    Past Surgical History: Past Surgical History  Procedure Laterality Date  . Replacement total knee Right      Allergies: Allergies  Allergen Reactions  . Latex Rash    Home Medications: Prescriptions prior to admission  Medication Sig Dispense Refill Last Dose  . acetaminophen (TYLENOL) 500 MG tablet Take 500 mg by mouth every 6 (six) hours as needed. For pain.   prn at prn  . albuterol (PROVENTIL) (2.5 MG/3ML) 0.083% nebulizer solution Take 3 mLs (2.5 mg total) by nebulization every 4 (four) hours as needed for wheezing or shortness of breath. 75 mL 12 prn at prn  . amiodarone (PACERONE) 200 MG tablet Take 1 tablet (200 mg total) by mouth daily. 30 tablet 0 05/15/2015 at 0800  . amoxicillin-clavulanate (AUGMENTIN) 500-125 MG tablet Take 1 tablet (500 mg total) by mouth 3 (three) times daily. 12 tablet 0 05/15/2015 at 1400  . atorvastatin (LIPITOR) 80 MG tablet Take 80 mg by mouth every evening.  1 05/14/2015 at 1800  . budesonide-formoterol (SYMBICORT) 80-4.5 MCG/ACT inhaler Inhale 2 puffs into the lungs 2 (two) times daily.   05/15/2015 at 0800  . Calcium Carbonate-Vitamin D 600-400 MG-UNIT tablet Take 2 tablets by mouth 2 (two) times daily.   05/15/2015 at 0800  . collagenase (SANTYL) ointment Apply 1 application topically 2 (two) times daily. Apply a thick layer to posterior left lower leg, cover with NS damp to dry gauze, wrap with kerlix   05/15/2015 at 0700  . diltiazem (CARDIZEM CD) 180 MG 24 hr capsule Take 360 mg by mouth every morning.  1 05/15/2015 at  0800  . furosemide (LASIX) 20 MG tablet Take 20 mg by mouth daily.   05/15/2015 at 0800  . gabapentin (NEURONTIN) 300 MG capsule Take 1 capsule (300 mg total) by mouth 2 (two) times daily. 60 capsule 0 05/15/2015 at 0800  . LORazepam (ATIVAN) 0.5 MG tablet Take 0.25 mg by mouth every 4 (four) hours as needed for anxiety.   prn at prn  . metoprolol succinate (TOPROL-XL) 100 MG 24 hr tablet Take 100 mg by mouth every morning.  1 05/15/2015 at 0800  . morphine (ROXANOL) 20 MG/ML concentrated solution Take 5-10 mg by mouth every hour as needed for severe  pain.   prn at prn  . oxyCODONE (OXY IR/ROXICODONE) 5 MG immediate release tablet Take 5-10 mg by mouth every 4 (four) hours as needed for moderate pain or severe pain.   prn at prn  . polyethylene glycol (MIRALAX / GLYCOLAX) packet Take 17 g by mouth 2 (two) times daily. 14 each 0 05/15/2015 at 0800  . potassium chloride SA (K-DUR,KLOR-CON) 20 MEQ tablet Take 40 mEq by mouth daily.   05/15/2015 at 0800  . predniSONE (DELTASONE) 10 MG tablet Take 10 mg by mouth daily.  9 05/15/2015 at 0800  . prochlorperazine (COMPAZINE) 10 MG tablet Take 10 mg by mouth every 6 (six) hours as needed. For nausea and vomiting.   prn at prn  . senna-docusate (SENOKOT-S) 8.6-50 MG tablet Take 2 tablets by mouth 2 (two) times daily.   05/15/2015 at 0800  . sodium chloride (OCEAN) 0.65 % SOLN nasal spray Place 1 spray into both nostrils as needed for congestion.   prn at prn  . tiotropium (SPIRIVA) 18 MCG inhalation capsule Place 18 mcg into inhaler and inhale daily.   05/15/2015 at 0800  . traZODone (DESYREL) 50 MG tablet Take 50 mg by mouth at bedtime as needed for sleep.   prn at prn  . warfarin (COUMADIN) 2 MG tablet Take 2 mg by mouth daily.   05/14/2015 at 1700  . Leuprolide Acetate, 6 Month, (LUPRON) 45 MG injection Inject 45 mg into the muscle every 6 (six) months.   04/22/2015   Home medication reconciliation was completed with the patient.   Scheduled Inpatient Medications:   . sodium chloride  10 mL/hr Intravenous Once  . amiodarone  200 mg Oral Daily  . amoxicillin-clavulanate  1 tablet Oral TID  . antiseptic oral rinse  7 mL Mouth Rinse BID  . atorvastatin  80 mg Oral QPM  . collagenase  1 application Topical BID  . diltiazem  360 mg Oral BH-q7a  . furosemide  20 mg Oral Daily  . gabapentin  300 mg Oral BID  . metoprolol succinate  100 mg Oral BH-q7a  . mometasone-formoterol  2 puff Inhalation BID  . potassium chloride SA  40 mEq Oral Daily  . predniSONE  10 mg Oral Daily  . sodium chloride flush  3 mL  Intravenous Q12H  . tiotropium  18 mcg Inhalation Daily    Continuous Inpatient Infusions:   . sodium chloride 50 mL/hr at 05/16/15 1000    PRN Inpatient Medications:  acetaminophen **OR** acetaminophen, ipratropium-albuterol, LORazepam, morphine CONCENTRATE, ondansetron **OR** ondansetron (ZOFRAN) IV, oxyCODONE, sodium chloride, traZODone  Family History: family history includes Brain cancer in his sister; Stroke in his father.  The patient's family history is negative for inflammatory bowel disorders, GI malignancy, or solid organ transplantation.  Social History:   reports that he has quit smoking. He does not have  any smokeless tobacco history on file. He reports that he drinks alcohol. The patient denies ETOH, tobacco, or drug use.   Review of Systems: Constitutional: + weight loss.  Eyes: No changes in vision. ENT: No oral lesions, sore throat.  GI: see HPI.  Heme/Lymph: No easy bruising.  CV: No chest pain.  GU: No hematuria.  Integumentary: No rashes.  Neuro: No headaches.  Psych: No depression/anxiety.  Endocrine: No heat/cold intolerance.  Allergic/Immunologic: No urticaria.  Resp: No cough, SOB.  Musculoskeletal: No joint swelling.    Physical Examination: BP 94/65 mmHg  Pulse 80  Temp(Src) 98.2 F (36.8 C) (Oral)  Resp 15  Ht 6\' 1"  (1.854 m)  Wt 229 lb 4.5 oz (104 kg)  BMI 30.26 kg/m2  SpO2 94% Gen: NAD, alert and oriented x 4 HEENT: PEERLA, EOMI, Neck: supple, no JVD or thyromegaly Chest: CTA bilaterally, no wheezes, crackles, or other adventitious sounds CV: RRR, no m/g/c/r Abd: soft, NT, minimally distended, +BS in all four quadrants; no HSM, guarding, ridigity, or rebound tenderness Rectal-maroon colored stool in rectal vault.  Ext: no edema, well perfused with 2+ pulses, Skin: ulceration on posterior right leg.  Lymph: no LAD  Data: Lab Results  Component Value Date   WBC 8.0 05/16/2015   HGB 8.7* 05/16/2015   HCT 25.4* 05/16/2015   MCV  107.2* 05/16/2015   PLT 203 05/16/2015    Recent Labs Lab 05/16/15 0100 05/16/15 0616 05/16/15 1023  HGB 7.4* 8.1* 8.7*   Lab Results  Component Value Date   NA 140 05/16/2015   K 4.0 05/16/2015   CL 107 05/16/2015   CO2 29 05/16/2015   BUN 35* 05/16/2015   CREATININE 1.28* 05/16/2015   Lab Results  Component Value Date   ALT 19 05/15/2015   AST 26 05/15/2015   ALKPHOS 139* 05/15/2015   BILITOT 1.1 05/15/2015    IMPRESSION: Stable bibasilar atelectatic changes.  Sclerotic vertebral bodies consistent with metastatic disease.  Scattered large and small bowel gas without obstructive change.    Recent Labs Lab 05/16/15 0100  INR 1.47   Assessment/Plan:  Mr. Savidge is a 82 y.o. male with PMHx of COPD, atrial fib, prostate cancer admitted for large volume rectal bleeding.   1. Rectal bleeding - consider AVM, polyp, neoplasia, diverticular, etc all exacerbated by excessive anti-coagulation. Clinically bleeding improved and hgb up. INR improving as well. Continue holding warfarin. Will plan for colonoscopy Wed 3/15. Prep tomorrow night. Continue monitoring H/H. Consider bleeding scan if develops reoccurance of large volume rectal bleeding in interim. He is a higher risk candidate for colonoscopy based on his cardiac co-morbidities.   2. Anemia - acute. Plan as above.   Recommendations: 1. Clear liquid diet until colonoscopy.  2. Begin prep tomorrow night.  3. Colonoscopy 3/15 with Dr. Candace Cruise.    *Case discussed w/ Dr. Candace Cruise.    Thank you for the consult. Please call with questions or concerns.  Ronney Asters, PA-C [place]

## 2015-05-16 NOTE — Consult Note (Signed)
WOC wound consult note Reason for Consult: NOnhealing ulcer to left posterior lower leg, present on admission, full thickness Wound type:Consistent with venous insufficiency Pressure Ulcer POA: N/A Measurement:8.4 cm x 5.6 cm x 0.2 cm with devitalized tissue in wound bed.  WIll begin enzymatic debridement.  Wound bed:75% devitalized tissue Drainage (amount, consistency, odor) Moderate serosanguinous  No odor Periwound:Chronic skin changes from venous insufficiency Dressing procedure/placement/frequency:Cleanse wound to left posterior lower leg with NS and pat gently dry.  Apply santyl ointment to wound bed.  Cover with NS moist gauze, 4x4 and kerlix.  Change daily.  Will not follow at this time.  Please re-consult if needed.  Domenic Moras RN BSN [place] Pager [phone]

## 2015-05-16 NOTE — Progress Notes (Signed)
Sign out to me by Dr Darvin Neighbours to follow repeat Hgb with goal >8.  Repeat Hgb was 7.4, another unit PRBC ordered with subsequent recheck Hgb.  Jacqulyn Bath [facility] 05/16/2015, 1:47 AM

## 2015-05-16 NOTE — Clinical Social Work Note (Signed)
Clinical Social Work Assessment  Patient Details  Name: Chad Kirby MRN: [id number] Date of Birth: 1929-07-20  Date of referral:  05/16/15               Reason for consult:  Facility Placement                Permission sought to share information with:  Family Supports, Chartered certified accountant granted to share information::  Yes, Verbal Permission Granted  Name::        Agency::     Relationship::     Contact Information:     Housing/Transportation Living arrangements for the past 2 months:  [place], [place] of Information:  Spouse Patient Interpreter Needed:  None Criminal Activity/Legal Involvement Pertinent to Current Situation/Hospitalization:  No - Comment as needed Significant Relationships:  Adult Children, Spouse Lives with:  Spouse Do you feel safe going back to the place where you live?  Yes Need for family participation in patient care:  Yes (Comment)  Care giving concerns:  Patient lives with his wife at home and has been at [facility] for [facility] for approximately a week.   Social Worker assessment / plan:  CSW spoke with patient's wife this afternoon in patient's ICU room. Patient was sleeping on and off and did not participate in the assessment. Patient's wife stated that patient has been at [facility] for rehab for approximately one week and that he has not enjoyed it over at [place]. Patient's wife stated that he would not enjoy it anywhere he went because he has a "strong personality" and is very particular. Patient's wife stated that patient cannot walk and he will have to return for rehab. CSW explained that reauth will have to be obtained for patient to return to [place]. CSW has asked Maudie Mercury at [facility] if patient can return when time and still awaiting her response.   CSW encouraged patient's wife to take care of herself and patient's wife stated that she goes home at night to rest and spend time with their little  dog. She further stated that they have 4 children all of whom are supportive. The daughter in [place] has come to see them this week and the daughter in [place] just left to return home. Patient's other daughter is a Education officer, museum some distance away and their son is in the "gas" business and has been busy with this. CSW provided supportive counseling. Will continue to follow.  Employment status:  Retired Nurse, adult PT Recommendations:  Not assessed at this time Information / Referral to community resources:     Patient/Family's Response to care:  Patient's wife expressed appreciation for CSW visit.  Patient/Family's Understanding of and Emotional Response to Diagnosis, Current Treatment, and Prognosis:  Patient's wife was visibly tired this afternoon and CSW encouraged her to get some rest. Patient's wife was pleasant and engaged in conversation with CSW.   Emotional Assessment Appearance:  Appears stated age Attitude/Demeanor/Rapport:   (sleepy this afternoon and let his wife do all the talking) Affect (typically observed):  Calm Orientation:  Oriented to Self, Oriented to Place, Oriented to  Time, Oriented to Situation Alcohol / Substance use:  Not Applicable Psych involvement (Current and /or in the community):  No (Comment)  Discharge Needs  Concerns to be addressed:  Care Coordination Readmission within the last 30 days:  Yes Current discharge risk:  None Barriers to Discharge:  No Barriers Identified   Shela Leff, LCSW  05/16/2015, 3:28 PM

## 2015-05-16 NOTE — Consult Note (Signed)
  Pt seen and examined. Please see Rushie Chestnut' notes. Pt with lower GI bleeding on coumadin. Reversed with FFP/Vit K. Expect bleeding to either stop or slow down significantly. Likely had diverticular bleeding. Since last colonoscopy over 30 years ago, recommend repeating colonoscopy before coumadin is resumed. Will start clear liquid diet. Prep pt tomorrow afternoon, and schedule colonoscopy Wed at 1 PM. Thanks.

## 2015-05-16 NOTE — Plan of Care (Signed)
Problem: Bowel/Gastric: Goal: Will show no signs and symptoms of gastrointestinal bleeding Outcome: Progressing No BM, no rectal bleeding observed.  Problem: Education: Goal: Ability to identify signs and symptoms of gastrointestinal bleeding will improve Outcome: Completed/Met Date Met:  05/16/15 Adequately identifies GI bleed signs and symptoms.  Problem: Fluid Volume: Goal: Will show no signs and symptoms of excessive bleeding Outcome: Progressing No signs/symptoms of bleed.  Lab values continue to improve.  Problem: Skin Integrity: Goal: Risk for impaired skin integrity will decrease Outcome: Progressing Patient declines being positioned with pillow due to discomfort; however, allows a log roll/reposition every 2 hours

## 2015-05-16 NOTE — Care Management (Signed)
Patient with recent discharge from [facility] to Patient presents from [facility].  He is readmitted with lower gi bleed and has required 2 units of frozen plasma and 1 unit of packed red blood cells.  His INR  was 4.18 on admission.  GI consult is pending.   Referral to CSW for facility follow up

## 2015-05-16 NOTE — Consult Note (Signed)
[facility] Cardiology  CARDIOLOGY CONSULT NOTE  Patient ID: Chad Kirby MRN: [id number] DOB/AGE: 80-29-1931 80 y.o.  Admit date: 05/15/2015 Referring Physician Gouru Primary Physician Olmedo Primary Cardiologist Blaine Hari Reason for ConsultationAtrial fibrillation  HPI: 82 year old gentleman referred for evaluation of atrial fibrillation. The patient has a history of chronic atrial fibrillation, on warfarin for stroke prevention. The patient was recently hospitalized 05/01/2015 with cellulitis left lower extremity with concomitant atrial fibrillation with a rapid ventricular rate, controlled with amiodarone. Patient was discharged to skilled nursing facility, where he's had poor by mouth intake and experienced maroon-colored stools yesterday. Admission labs were notable for anemia, with hemoglobin and hematocrit of 8.1 and 23.9, respectively. INR was 4.18. The patient was treated with FFP and vitamin K. He was also transfused. The patient was seen by GI today, and is tentatively planned for colonoscopy 05/18/2015. The patient currently denies chest pain or shortness of breath.  Review of systems complete and found to be negative unless listed above     Past Medical History  Diagnosis Date  . COPD (chronic obstructive pulmonary disease) ([place])   . Cancer [facility])     Prostate with bone mets-current chemo treatment  . Hyperlipidemia   . Atrial fibrillation [facility])     Past Surgical History  Procedure Laterality Date  . Replacement total knee Right     Prescriptions prior to admission  Medication Sig Dispense Refill Last Dose  . acetaminophen (TYLENOL) 500 MG tablet Take 500 mg by mouth every 6 (six) hours as needed. For pain.   prn at prn  . albuterol (PROVENTIL) (2.5 MG/3ML) 0.083% nebulizer solution Take 3 mLs (2.5 mg total) by nebulization every 4 (four) hours as needed for wheezing or shortness of breath. 75 mL 12 prn at prn  . amiodarone (PACERONE) 200 MG tablet Take 1 tablet (200 mg total)  by mouth daily. 30 tablet 0 05/15/2015 at 0800  . amoxicillin-clavulanate (AUGMENTIN) 500-125 MG tablet Take 1 tablet (500 mg total) by mouth 3 (three) times daily. 12 tablet 0 05/15/2015 at 1400  . atorvastatin (LIPITOR) 80 MG tablet Take 80 mg by mouth every evening.  1 05/14/2015 at 1800  . budesonide-formoterol (SYMBICORT) 80-4.5 MCG/ACT inhaler Inhale 2 puffs into the lungs 2 (two) times daily.   05/15/2015 at 0800  . Calcium Carbonate-Vitamin D 600-400 MG-UNIT tablet Take 2 tablets by mouth 2 (two) times daily.   05/15/2015 at 0800  . collagenase (SANTYL) ointment Apply 1 application topically 2 (two) times daily. Apply a thick layer to posterior left lower leg, cover with NS damp to dry gauze, wrap with kerlix   05/15/2015 at 0700  . diltiazem (CARDIZEM CD) 180 MG 24 hr capsule Take 360 mg by mouth every morning.  1 05/15/2015 at 0800  . furosemide (LASIX) 20 MG tablet Take 20 mg by mouth daily.   05/15/2015 at 0800  . gabapentin (NEURONTIN) 300 MG capsule Take 1 capsule (300 mg total) by mouth 2 (two) times daily. 60 capsule 0 05/15/2015 at 0800  . LORazepam (ATIVAN) 0.5 MG tablet Take 0.25 mg by mouth every 4 (four) hours as needed for anxiety.   prn at prn  . metoprolol succinate (TOPROL-XL) 100 MG 24 hr tablet Take 100 mg by mouth every morning.  1 05/15/2015 at 0800  . morphine (ROXANOL) 20 MG/ML concentrated solution Take 5-10 mg by mouth every hour as needed for severe pain.   prn at prn  . oxyCODONE (OXY IR/ROXICODONE) 5 MG immediate release tablet Take 5-10  mg by mouth every 4 (four) hours as needed for moderate pain or severe pain.   prn at prn  . polyethylene glycol (MIRALAX / GLYCOLAX) packet Take 17 g by mouth 2 (two) times daily. 14 each 0 05/15/2015 at 0800  . potassium chloride SA (K-DUR,KLOR-CON) 20 MEQ tablet Take 40 mEq by mouth daily.   05/15/2015 at 0800  . predniSONE (DELTASONE) 10 MG tablet Take 10 mg by mouth daily.  9 05/15/2015 at 0800  . prochlorperazine (COMPAZINE) 10 MG tablet  Take 10 mg by mouth every 6 (six) hours as needed. For nausea and vomiting.   prn at prn  . senna-docusate (SENOKOT-S) 8.6-50 MG tablet Take 2 tablets by mouth 2 (two) times daily.   05/15/2015 at 0800  . sodium chloride (OCEAN) 0.65 % SOLN nasal spray Place 1 spray into both nostrils as needed for congestion.   prn at prn  . tiotropium (SPIRIVA) 18 MCG inhalation capsule Place 18 mcg into inhaler and inhale daily.   05/15/2015 at 0800  . traZODone (DESYREL) 50 MG tablet Take 50 mg by mouth at bedtime as needed for sleep.   prn at prn  . warfarin (COUMADIN) 2 MG tablet Take 2 mg by mouth daily.   05/14/2015 at 1700  . Leuprolide Acetate, 6 Month, (LUPRON) 45 MG injection Inject 45 mg into the muscle every 6 (six) months.   04/22/2015   Social History   Social History  . Marital Status: Married    Spouse Name: N/A  . Number of Children: N/A  . Years of Education: N/A   Occupational History  . Not on file.   Social History Main Topics  . Smoking status: Former Research scientist (life sciences)  . Smokeless tobacco: Not on file  . Alcohol Use: 0.0 oz/week    0 Standard drinks or equivalent per week     Comment: occasional  . Drug Use: Not on file  . Sexual Activity: Not on file   Other Topics Concern  . Not on file   Social History Narrative    Family History  Problem Relation Age of Onset  . Brain cancer Sister   . Stroke Father       Review of systems complete and found to be negative unless listed above      PHYSICAL EXAM  General: Well developed, well nourished, in no acute distress HEENT:  Normocephalic and atramatic Neck:  No JVD.  Lungs: Clear bilaterally to auscultation and percussion. Heart: HRRR . Normal S1 and S2 without gallops or murmurs.  Abdomen: Bowel sounds are positive, abdomen soft and non-tender  Msk:  Back normal, normal gait. Normal strength and tone for age. Extremities: No clubbing, cyanosis or edema.   Neuro: Alert and oriented X 3. Psych:  Good affect, responds  appropriately  Labs:   Lab Results  Component Value Date   WBC 8.0 05/16/2015   HGB 9.2* 05/16/2015   HCT 26.8* 05/16/2015   MCV 107.2* 05/16/2015   PLT 203 05/16/2015    Recent Labs Lab 05/15/15 1810 05/16/15 0100  NA 138 140  K 4.5 4.0  CL 102 107  CO2 29 29  BUN 38* 35*  CREATININE 1.49* 1.28*  CALCIUM 8.2* 8.2*  PROT 5.1*  --   BILITOT 1.1  --   ALKPHOS 139*  --   ALT 19  --   AST 26  --   GLUCOSE 103* 85   Lab Results  Component Value Date   TROPONINI 0.03 05/01/2015  No results found for: CHOL No results found for: HDL No results found for: LDLCALC No results found for: TRIG No results found for: CHOLHDL No results found for: LDLDIRECT    Radiology: Ct Abdomen Pelvis Wo Contrast  05/04/2015  CLINICAL DATA:  82 year old male with abdominal distention. No bowel movement for several days. EXAM: CT ABDOMEN AND PELVIS WITHOUT CONTRAST TECHNIQUE: Multidetector CT imaging of the abdomen and pelvis was performed following the standard protocol without IV contrast. COMPARISON:  No priors. FINDINGS: Lower chest: Heart size is enlarged with biatrial dilatation. Atherosclerotic calcifications are noted in the left main, left anterior descending, left circumflex and right coronary arteries. Mild calcifications of the aortic valve. Trace bilateral pleural effusions lying dependently. Dependent subsegmental atelectasis in the lower lobes of the lungs bilaterally. Hepatobiliary: No cystic or solid hepatic lesions are identified on today's noncontrast CT examination. Unenhanced appearance of the gallbladder is normal. Pancreas: No pancreatic mass or peripancreatic inflammatory changes on today's noncontrast CT examination. Spleen: Unremarkable. Adrenals/Urinary Tract: Tiny 2-3 mm calcifications in the right renal hilum, may be vascular or may be nonobstructive calculi. Parenchymal calcification in the upper pole of the left kidney is likely dystrophic. Unenhanced appearance of the  kidneys is otherwise unremarkable. No ureteral stones. No hydroureteronephrosis. Urinary bladder is completely decompressed around an indwelling Foley catheter. Bilateral adrenal glands are normal in appearance. Stomach/Bowel: Unenhanced appearance of the stomach is normal. No pathologic dilatation of small bowel or colon. Moderate gaseous distention is noted in the colon, which is highly nonspecific. Appendix is not confidently identified may be surgically absent. Regardless, there are no inflammatory changes noted adjacent to the cecum to suggest the presence of an acute appendicitis at this time. Vascular/Lymphatic: Atherosclerotic calcifications are noted throughout the abdominal and pelvic vasculature, without evidence of aneurysm or dissection. No lymphadenopathy noted in the abdomen or pelvis on today's noncontrast CT examination. Reproductive: Prostate gland is diminutive and partially calcified. Seminal vesicles are unremarkable in appearance. Other: No significant volume of ascites.  No pneumoperitoneum. Musculoskeletal: Numerous sclerotic lesions noted throughout the visualized axial and appendicular skeleton, highly concerning for widespread metastatic disease to the bones. The largest of the lesions are noted with than T11, T12 and L2 vertebral bodies which are nearly completely sclerotic. IMPRESSION: 1. No acute findings in the abdomen or pelvis. Specifically, no signs of bowel obstruction. 2. Widespread sclerotic lesions throughout the visualized axial and appendicular skeleton, highly concerning for metastatic disease to the bones. Statistically, this likely reflects prostate cancer. Correlation with PSA levels and physical examination is recommended. 3. Mild cardiomegaly with biatrial dilatation. 4. Trace bilateral pleural effusions lying dependently. 5. Tiny calcifications associated with DUB right renal hilum may represent 2-3 mm nonobstructive calculi, or may simply be vascular. 6. Additional  incidental findings, as above. Electronically Signed   By: Vinnie Langton M.D.   On: 05/04/2015 18:42   Dg Chest Port 1 View  05/02/2015  CLINICAL DATA:  82 year old male with PICC line placement. EXAM: PORTABLE CHEST 1 VIEW COMPARISON:  Radiograph dated 05/02/2015 FINDINGS: A right-sided PICC is noted with tip over central SVC. Single-view the chest demonstrate mild eventration of the left hemidiaphragm with associated minimal subsegmental atelectatic changes of the left lung base. There is no consolidation, pleural effusion, or pneumothorax. Stable cardiac silhouette. The osseous structures appear unremarkable. IMPRESSION: Right-sided PICC over central SVC. Electronically Signed   By: Anner Crete M.D.   On: 05/02/2015 18:41   Dg Chest Port 1 View  05/02/2015  CLINICAL DATA:  Central line placement. History of prostate carcinoma with bone Mets. EXAM: PORTABLE CHEST 1 VIEW COMPARISON:  05/01/2015 FINDINGS: There is no evidence of a central line on the included field of view. There is no pneumothorax. Mild enlargement of the cardiac silhouette. No mediastinal or hilar masses or convincing adenopathy. There is opacity at the left lung base similar to the prior study likely chronic atelectasis or scarring. Lungs otherwise clear. No pleural effusion. Bony thorax is demineralized but grossly intact. IMPRESSION: 1. No evidence of a central line on the included field of view. 2. No pneumothorax.  No acute cardiopulmonary disease. Electronically Signed   By: Lajean Manes M.D.   On: 05/02/2015 16:12   Dg Chest Port 1 View  05/01/2015  CLINICAL DATA:  Weakness following chemotherapy EXAM: PORTABLE CHEST 1 VIEW COMPARISON:  04/19/2013 FINDINGS: Cardiac shadow is stable. Elevation of left hemidiaphragm is again seen. No focal infiltrate or sizable effusion is noted. Nodular density is noted overlying the left lung base. No other focal abnormality is seen. IMPRESSION: Nodular density in the left lung base. This  may be related to the patient's known clinical history. Nonemergent CT of the chest is recommended to assess. No acute infiltrate is seen. Electronically Signed   By: Inez Catalina M.D.   On: 05/01/2015 17:13   Dg Abd 2 Views  05/04/2015  CLINICAL DATA:  Constipation, nausea and vomiting for 3 days, unable to have bowel movement, left leg cellulitis, history of prostate cancer. EXAM: ABDOMEN - 2 VIEW COMPARISON:  03/20/2006 FINDINGS: Mild gaseous distended small bowel loops are noted mid abdomen suspicious for ileus. There is significant gaseous distended right colon transverse colon and proximal left colon. Findings highly suspicious for colonic ileus. Some colonic gas and stool are noted in distal sigmoid colon and rectum. IMPRESSION: Mild gaseous distended small bowel loops are noted mid abdomen suspicious for ileus. There is significant gaseous distended right colon transverse colon and proximal left colon. Findings highly suspicious for colonic ileus. Some colonic gas and stool are noted in distal sigmoid colon and rectum. Electronically Signed   By: Lahoma Crocker M.D.   On: 05/04/2015 13:13   Dg Abd Acute W/chest  05/16/2015  CLINICAL DATA:  Rectal bleeding, decreased bowel movements EXAM: DG ABDOMEN ACUTE W/ 1V CHEST COMPARISON:  05/04/2015 FINDINGS: Cardiac shadow is enlarged. Elevation of the left hemidiaphragm is again noted. Bibasilar atelectatic changes are noted without focal confluent infiltrate. Scattered large and small bowel gas is noted. No free air is seen. No definitive obstructive change is noted. Multiple sclerotic vertebral bodies are noted consistent with metastatic disease. IMPRESSION: Stable bibasilar atelectatic changes. Sclerotic vertebral bodies consistent with metastatic disease. Scattered large and small bowel gas without obstructive change. Electronically Signed   By: Inez Catalina M.D.   On: 05/16/2015 12:10   US Abdomen Limited Ruq  05/02/2015  CLINICAL DATA:  Elevated bilirubin.   Inpatient. EXAM: US ABDOMEN LIMITED - RIGHT UPPER QUADRANT COMPARISON:  None. FINDINGS: Gallbladder: No gallstones or wall thickening visualized. No sonographic Murphy sign noted by sonographer. Common bile duct: Diameter: 3 mm Liver: Liver parenchyma is diffusely mildly echogenic with mild posterior acoustic attenuation, in keeping with mild diffuse hepatic steatosis. No definite liver surface irregularity. No liver mass is detected, noting decreased sensitivity in the setting of an echogenic liver. IMPRESSION: 1. Mild diffuse hepatic steatosis. 2. Otherwise normal right upper quadrant abdominal sonogram, with no cholelithiasis and no biliary ductal dilatation. Electronically Signed   By: Ilona Sorrel  M.D.   On: 05/02/2015 08:51    EKG: Atrial fibrillation  ASSESSMENT AND PLAN:   1. Chronic atrial fibrillation, currently asymptomatic, on warfarin for stroke prevention. 2. GI bleed, exacerbated by warfarin, with INR 4.18, now reversed. 3. Chronic kidney disease 4. Metastatic prostate cancer  Recommendations  1. Continue to hold warfarin 2. Continue current rate control medications 3. No further cardiac diagnostics at this time   Signed: Kin Galbraith MD,PhD, [facility] 05/16/2015, 4:37 PM

## 2015-05-16 NOTE — Progress Notes (Signed)
Informed Dr. Posey Pronto this am that I only gave the patient 2u of FFP and rechecked INR and it was WNL, so held the other 2u of FFP.

## 2015-05-16 NOTE — Progress Notes (Signed)
[facility] [place] NAME: Chad Kirby    MR#:  [id number]  DATE OF BIRTH:  Aug 21, 1929  SUBJECTIVE:  CHIEF COMPLAINT:  Patient is resting comfortably. No other episodes of rectal bleed overnight. Status post FFP 2 units. Positive flatus but no bowel movements since last night  REVIEW OF SYSTEMS:  CONSTITUTIONAL: No fever, fatigue or weakness.  EYES: No blurred or double vision.  EARS, NOSE, AND THROAT: No tinnitus or ear pain.  RESPIRATORY: No cough, shortness of breath, wheezing or hemoptysis.  CARDIOVASCULAR: No chest pain, orthopnea, edema.  GASTROINTESTINAL: No nausea, vomiting, diarrhea or abdominal pain.  GENITOURINARY: No dysuria, hematuria.  ENDOCRINE: No polyuria, nocturia,  HEMATOLOGY: No anemia, easy bruising or bleeding SKIN: No rash or lesion. MUSCULOSKELETAL: No joint pain or arthritis.   NEUROLOGIC: No tingling, numbness, weakness.  PSYCHIATRY: No anxiety or depression.   DRUG ALLERGIES:   Allergies  Allergen Reactions  . Latex Rash    VITALS:  Blood pressure 99/70, pulse 109, temperature 98 F (36.7 C), temperature source Oral, resp. rate 13, height 6\' 1"  (1.854 m), weight 104 kg (229 lb 4.5 oz), SpO2 92 %.  PHYSICAL EXAMINATION:  GENERAL:  82 y.o.-year-old patient lying in the bed with no acute distress.  EYES: Pupils equal, round, reactive to light and accommodation. No scleral icterus. Extraocular muscles intact.  HEENT: Head atraumatic, normocephalic. Oropharynx and nasopharynx clear.  NECK:  Supple, no jugular venous distention. No thyroid enlargement, no tenderness.  LUNGS: Normal breath sounds bilaterally, no wheezing, rales,rhonchi or crepitation. No use of accessory muscles of respiration.  CARDIOVASCULAR: Irregularly irregular. No murmurs, rubs, or gallops.  ABDOMEN: Soft, nontender, distended. Tympanic bowel sounds present. No organomegaly or mass.  EXTREMITIES: No pedal edema, cyanosis, or  clubbing.  NEUROLOGIC: Cranial nerves II through XII are intact. Muscle strength 5/5 in all extremities. Sensation intact. Gait not checked.  PSYCHIATRIC: The patient is alert and oriented x 3.  SKIN: No obvious rash, lesion, or ulcer.    LABORATORY PANEL:   CBC  Recent Labs Lab 05/16/15 0100 05/16/15 0616  WBC 8.0  --   HGB 7.4* 8.1*  HCT 21.7* 23.9*  PLT 203  --    ------------------------------------------------------------------------------------------------------------------  Chemistries   Recent Labs Lab 05/11/15 0600  05/15/15 1810 05/16/15 0100  NA 134*  < > 138 140  K 3.4*  < > 4.5 4.0  CL 99*  < > 102 107  CO2 28  < > 29 29  GLUCOSE 90  < > 103* 85  BUN 19  < > 38* 35*  CREATININE 1.11  < > 1.49* 1.28*  CALCIUM 8.5*  < > 8.2* 8.2*  MG 1.7  --   --   --   AST 28  < > 26  --   ALT 26  < > 19  --   ALKPHOS 107  < > 139*  --   BILITOT 0.8  < > 1.1  --   < > = values in this interval not displayed. ------------------------------------------------------------------------------------------------------------------  Cardiac Enzymes No results for input(s): TROPONINI in the last 168 hours. ------------------------------------------------------------------------------------------------------------------  RADIOLOGY:  No results found.  EKG:   Orders placed or performed during the hospital encounter of 05/01/15  . ED EKG  . ED EKG  . EKG 12-Lead  . EKG 12-Lead    ASSESSMENT AND PLAN:   * Lower GI bleed with  Coumadin coagulopathy and acute blood loss anemia Rectal bleed is  improving Status post blood transfusion, FFP 2 units and vitamin K INR 4.5--1.47 Monitor hemoglobin and hematocrit closely Coumadin on hold Presently hemoglobin 8.9-8.1. Nothing by mouth Consult GI for colonoscopy.  * Atrial fibrillation Continue rate control medications.  Hold Coumadin.  Discussed with patient and family regarding risk versus benefit and reason for stopping  Coumadin at this point.-Family would like to discuss with Dr. Clayborn Bigness, consult placed to cardiology-[facility]  *Chronic history of constipation with recent ileus get abdominal series  * CKD stage III  * Prostate cancer with bone metastases. Follow-up with oncology as outpatient.  * COPD is stable. Continue inhalers. Nebulizers when necessary.  * Chronic lower extremity pressure ulcers. Continue collagenase that is being applied as outpatient. Continue Augmentin. Wound care consult.  * DVT prophylaxis. Present the INR 4.5--1.7.  No heparin or Lovenox once reversed due to GI bleed SCDs    All the records are reviewed and case discussed with Care Management/Social Workerr. Management plans discussed with the patient, family -wife and daughter and they are in agreement.  CODE STATUS:  DO NOT RESUSCITATE TOTAL TIME TAKING CARE OF THIS PATIENT: 35 minutes.   POSSIBLE D/C IN 3-4 DAYS, DEPENDING ON CLINICAL CONDITION.   [name] M.D on 05/16/2015 at 9:44 AM  Between 7am to 6pm - Pager - [phone] After 6pm go to [email or web address] - password [email or web address]  Office  [phone]  CC: Primary care physician; No primary care provider on file.

## 2015-05-16 NOTE — Care Management (Addendum)
It has been reported to CM that [facility] reported to [facility] that "patient needs hospice."  There is an unsubstantiated report that patient has requested this in the past but family would not agree."  Patient was minimally participating in the physical therapy at the skilled nursing.  He will have to be evaluated by physical there prior to return as patient's insurance will have to authorize skilled nursing again.  Patient is currently a DNR.  May benefit from palliative care consult for goals of treatment.  Colonoscopy to be performed 13/15.  Paged MD for physical therapy consult and consider never palliative care

## 2015-05-16 NOTE — Clinical Social Work Note (Signed)
CSW received call from [place] at [facility] and she stated that they will take patient back however, they stated that they do not believe rehab is appropriate for patient at this time because patient did not participate in PT and he wanted to be left alone and requested hospice. Caryl Pina stated that the family was a barrier to this. CSW concern at this time would be if [facility] would reauth patient to go for STR if patient is not participating and would want to ensure patient's wishes are being respected as well.  Shela Leff MSW,LCSW [phone]

## 2015-05-17 LAB — PREPARE FRESH FROZEN PLASMA
UNIT DIVISION: 0
Unit division: 0

## 2015-05-17 LAB — TYPE AND SCREEN
ABO/RH(D): A POS
Antibody Screen: NEGATIVE
UNIT DIVISION: 0

## 2015-05-17 LAB — CBC
HEMATOCRIT: 24.4 % — AB (ref 40.0–52.0)
HEMOGLOBIN: 8.3 g/dL — AB (ref 13.0–18.0)
MCH: 35.2 pg — AB (ref 26.0–34.0)
MCHC: 34 g/dL (ref 32.0–36.0)
MCV: 103.6 fL — AB (ref 80.0–100.0)
Platelets: 178 10*3/uL (ref 150–440)
RBC: 2.36 MIL/uL — AB (ref 4.40–5.90)
RDW: 18.6 % — ABNORMAL HIGH (ref 11.5–14.5)
WBC: 8.1 10*3/uL (ref 3.8–10.6)

## 2015-05-17 LAB — BASIC METABOLIC PANEL
Anion gap: 6 (ref 5–15)
BUN: 25 mg/dL — AB (ref 6–20)
CHLORIDE: 107 mmol/L (ref 101–111)
CO2: 26 mmol/L (ref 22–32)
Calcium: 7.3 mg/dL — ABNORMAL LOW (ref 8.9–10.3)
Creatinine, Ser: 1.1 mg/dL (ref 0.61–1.24)
GFR calc Af Amer: >60 mL/min (ref >60)
GFR calc non Af Amer: >60 mL/min (ref >60)
Glucose, Bld: 82 mg/dL (ref 65–99)
POTASSIUM: 3.7 mmol/L (ref 3.5–5.1)
SODIUM: 139 mmol/L (ref 135–145)

## 2015-05-17 LAB — PROTIME-INR
INR: 1.11
PROTHROMBIN TIME: 14.5 s (ref 11.4–15.0)

## 2015-05-17 LAB — HEMOGLOBIN: HEMOGLOBIN: 9.2 g/dL — AB (ref 13.0–18.0)

## 2015-05-17 MED ORDER — SODIUM CHLORIDE 0.9 % IV SOLN
INTRAVENOUS | Status: DC
  Administered 2015-05-17: 21:00:00 via INTRAVENOUS

## 2015-05-17 MED ORDER — SODIUM CHLORIDE 0.9 % IV BOLUS (SEPSIS)
1000.0000 mL | Freq: Once | INTRAVENOUS | Status: AC
  Administered 2015-05-17: 1000 mL via INTRAVENOUS

## 2015-05-17 NOTE — Evaluation (Signed)
Physical Therapy Evaluation Patient Details Name: Chad Kirby MRN: [id number] DOB: [birth year] Today's Date: 05/17/2015   History of Present Illness  82 yo M recently admitted to hospital at the end of February for ileus and cellulitis, discharging on 3/3 to STR at [facility]. Pt returned to ED on 3/12 from [facility] for  a rectal bleed. PMH includes COPD, a-fib, prostate cancer with bone metastasis.  Clinical Impression  Pt demonstrates generalized weakness and difficulty walking due to increasing recent medical complications and decreased functional activity. He requires modA +1 for bed mobility and min guard to maintain seated balance. Transfers and very minimal ambulation(75ft) with FWW requires minA +2 for safety and cues for safe technique. Supine vitals at rest: BP 95/55, HR 72, SpO2 90% 2L; seated vitals: 95/67, HR 84 and SpO2 95% 2L; after standing: BP 75/39, HR 119. After seated rest break for 2-3 minutes BP increased to 89/56 and HR down to 77. STR is recommended for increasing pt's functional mobility and QOL. Pt isn't an advocate or "fan" of physical therapy but he was willing to participate today and he wants to walk again. PT discussed how exercise helps to progress towards that goal and he verbalized understanding. Pt will benefit from skilled PT services to increase functional I and mobility for returning to PLOF.     Follow Up Recommendations SNF    Equipment Recommendations  None recommended by PT    Recommendations for Other Services       Precautions / Restrictions Precautions Precautions: Fall Restrictions Weight Bearing Restrictions: No      Mobility  Bed Mobility Overal bed mobility: Needs Assistance Bed Mobility: Supine to Sit     Supine to sit: Mod assist     General bed mobility comments: uses rail; cues for hand placement and technique; difficulty getting trunk upright  Transfers Overall transfer level: Needs assistance Equipment used:  Rolling walker (2 wheeled) Transfers: Sit to/from [facility] Sit to Stand: Min assist;+2 physical assistance Stand pivot transfers: Min assist;+2 physical assistance       General transfer comment: cues for hand placement, postural correction and safe technique   Ambulation/Gait Ambulation/Gait assistance: Min assist;+2 physical assistance Ambulation Distance (Feet): 5 Feet Assistive device: Rolling walker (2 wheeled) Gait Pattern/deviations: Step-to pattern;Decreased stride length;Shuffle;Trunk flexed;Narrow base of support Gait velocity: reduced   General Gait Details: Further ambulation NT due to BPs, dizziness and weakness.   Stairs            Wheelchair Mobility    Modified Rankin (Stroke Patients Only)       Balance Overall balance assessment: Needs assistance Sitting-balance support: Bilateral upper extremity supported Sitting balance-Leahy Scale: Fair Sitting balance - Comments: sat for 10 minutes EOB without LOB with min guard, cues for scooting to EOB for increased LE support   Standing balance support: Bilateral upper extremity supported Standing balance-Leahy Scale: Poor Standing balance comment: unsteady,tolerance 1-2 minutes                             Pertinent Vitals/Pain Pain Assessment: No/denies pain    Home Living Family/patient expects to be discharged to:: Skilled nursing facility Living Arrangements: Spouse/significant other Available Help at Discharge: Family Type of Home: House Home Access: Stairs to enter Entrance Stairs-Rails: Can reach both Entrance Stairs-Number of Steps:  (2) Home Layout: Two level (pt can live on first level) Home Equipment: Walker - 2 wheels  Prior Function Level of Independence: Independent with assistive device(s)         Comments: Pt was household ambulatory up until about 3 weeks ago.     Hand Dominance        Extremity/Trunk Assessment   Upper Extremity  Assessment: Generalized weakness           Lower Extremity Assessment: Generalized weakness         Communication   Communication: No difficulties  Cognition Arousal/Alertness: Awake/alert Behavior During Therapy: Agitated;WFL for tasks assessed/performed Overall Cognitive Status: Within Functional Limits for tasks assessed                      General Comments General comments (skin integrity, edema, etc.): B LE edema and discoloration (staining)    Exercises Other Exercises Other Exercises: B LE therex: supine: ankle pumps, QS, GS, heel slides x10 each; seated LAQs and marching x10 each. Cues for technique. Therapeutic rest breaks for energy conservation. Other Exercises: Pt sat at EOB 10 minutes for increased endurance with cues for postural correction and balance correction. No LOB with min guard. No c/o dizziness.      Assessment/Plan    PT Assessment Patient needs continued PT services  PT Diagnosis Difficulty walking;Generalized weakness   PT Problem List Decreased strength;Decreased activity tolerance;Decreased balance;Decreased mobility;Decreased knowledge of use of DME;Decreased safety awareness;Cardiopulmonary status limiting activity  PT Treatment Interventions DME instruction;Gait training;Stair training;Therapeutic activities;Therapeutic exercise;Balance training;Neuromuscular re-education;Patient/family education   PT Goals (Current goals can be found in the Care Plan section) Acute Rehab PT Goals Patient Stated Goal: to walk again PT Goal Formulation: With patient Time For Goal Achievement: 05/31/15 Potential to Achieve Goals: Fair    Frequency Min 2X/week   Barriers to discharge Inaccessible home environment;Decreased caregiver support .    Co-evaluation               End of Session Equipment Utilized During Treatment: Gait belt;Oxygen Activity Tolerance: Patient limited by fatigue Patient left: in chair;with call bell/phone within  reach;with chair alarm set;with family/visitor present Nurse Communication: Mobility status;Other (comment) (BPs)         Time: [id number] PT Time Calculation (min) (ACUTE ONLY): 38 min   Charges:   PT Evaluation $PT Eval Moderate Complexity: 1 Procedure PT Treatments $Therapeutic Exercise: 8-22 mins $Therapeutic Activity: 8-22 mins   PT G Codes:        Neoma Laming, PT, DPT  05/17/2015, 3:52 PM [phone]

## 2015-05-17 NOTE — Progress Notes (Signed)
Pt. With GI bleed on GoLytely prep for colonoscopy in the AM. Pt. Had BM of qpprox. 500 Dk red bloody stool. MD called and Stat Hemoglobin ordered. Pt.

## 2015-05-17 NOTE — Clinical Social Work Note (Signed)
CSW spoke with patient today alone regarding discharge planning. CSW spoke with patient about reports that he had mentioned he had wanted to go home with hospice. Patient validated that he did not want to do rehab and would rather be home but stated he never requested hospice. Patient was able to tell CSW what hospice entailed and he stated that if he said that, he does not remember it and he stated he was probably upset. Patient stated he does not want hospice. CSW spoke with patient about his inability to walk and the fact that his wife would not be able to care for him at home without 24/7 assistance. Patient is willing to work with PT here at hospital and await their recommendations. CSW spoke at length with patient's wife and daughter, Chad Kirby, this afternoon discussing discharge planning and offering supportive counseling.

## 2015-05-17 NOTE — Progress Notes (Signed)
[place] [facility] [place] NAME: Chad Kirby    MR#:  [id number]  DATE OF BIRTH:  Dec 01, 1929  SUBJECTIVE:  CHIEF COMPLAINT:  Patient is resting comfortably.denies any abd pain .  No other episodes of rectal bleed overnight. Status post FFP 2 units. Positive flatus but no bowel movements yet   REVIEW OF SYSTEMS:  CONSTITUTIONAL: No fever, fatigue or weakness.  EYES: No blurred or double vision.  EARS, NOSE, AND THROAT: No tinnitus or ear pain.  RESPIRATORY: No cough, shortness of breath, wheezing or hemoptysis.  CARDIOVASCULAR: No chest pain, orthopnea, edema.  GASTROINTESTINAL: No nausea, vomiting, diarrhea or abdominal pain.  GENITOURINARY: No dysuria, hematuria.  ENDOCRINE: No polyuria, nocturia,  HEMATOLOGY: No anemia, easy bruising or bleeding SKIN: No rash or lesion. MUSCULOSKELETAL: No joint pain or arthritis.   NEUROLOGIC: No tingling, numbness, weakness.  PSYCHIATRY: No anxiety or depression.   DRUG ALLERGIES:   Allergies  Allergen Reactions  . Latex Rash    VITALS:  Blood pressure 91/56, pulse 84, temperature 98 F (36.7 C), temperature source Oral, resp. rate 20, height 6\' 1"  (1.854 m), weight 106.323 kg (234 lb 6.4 oz), SpO2 92 %.  PHYSICAL EXAMINATION:  GENERAL:  82 y.o.-year-old patient lying in the bed with no acute distress.  EYES: Pupils equal, round, reactive to light and accommodation. No scleral icterus. Extraocular muscles intact.  HEENT: Head atraumatic, normocephalic. Oropharynx and nasopharynx clear.  NECK:  Supple, no jugular venous distention. No thyroid enlargement, no tenderness.  LUNGS: Normal breath sounds bilaterally, no wheezing, rales,rhonchi or crepitation. No use of accessory muscles of respiration.  CARDIOVASCULAR: Irregularly irregular. No murmurs, rubs, or gallops.  ABDOMEN: Soft, nontender, distended. Tympanic bowel sounds present. No organomegaly or mass.  EXTREMITIES: No pedal edema,  cyanosis, or clubbing.  NEUROLOGIC: Cranial nerves II through XII are intact. Muscle strength 5/5 in all extremities. Sensation intact. Gait not checked.  PSYCHIATRIC: The patient is alert and oriented x 3.  SKIN: No obvious rash, lesion, or ulcer.    LABORATORY PANEL:   CBC  Recent Labs Lab 05/17/15 0405  WBC 8.1  HGB 8.3*  HCT 24.4*  PLT 178   ------------------------------------------------------------------------------------------------------------------  Chemistries   Recent Labs Lab 05/11/15 0600  05/15/15 1810  05/17/15 0405  NA 134*  < > 138  < > 139  K 3.4*  < > 4.5  < > 3.7  CL 99*  < > 102  < > 107  CO2 28  < > 29  < > 26  GLUCOSE 90  < > 103*  < > 82  BUN 19  < > 38*  < > 25*  CREATININE 1.11  < > 1.49*  < > 1.10  CALCIUM 8.5*  < > 8.2*  < > 7.3*  MG 1.7  --   --   --   --   AST 28  < > 26  --   --   ALT 26  < > 19  --   --   ALKPHOS 107  < > 139*  --   --   BILITOT 0.8  < > 1.1  --   --   < > = values in this interval not displayed. ------------------------------------------------------------------------------------------------------------------  Cardiac Enzymes No results for input(s): TROPONINI in the last 168 hours. ------------------------------------------------------------------------------------------------------------------  RADIOLOGY:  Dg Abd Acute W/chest  05/16/2015  CLINICAL DATA:  Rectal bleeding, decreased bowel movements EXAM: DG ABDOMEN ACUTE W/ 1V CHEST COMPARISON:  05/04/2015 FINDINGS: Cardiac shadow is enlarged. Elevation of the left hemidiaphragm is again noted. Bibasilar atelectatic changes are noted without focal confluent infiltrate. Scattered large and small bowel gas is noted. No free air is seen. No definitive obstructive change is noted. Multiple sclerotic vertebral bodies are noted consistent with metastatic disease. IMPRESSION: Stable bibasilar atelectatic changes. Sclerotic vertebral bodies consistent with metastatic disease.  Scattered large and small bowel gas without obstructive change. Electronically Signed   By: Inez Catalina M.D.   On: 05/16/2015 12:10    EKG:   Orders placed or performed during the hospital encounter of 05/01/15  . ED EKG  . ED EKG  . EKG 12-Lead  . EKG 12-Lead    ASSESSMENT AND PLAN:   * Lower GI bleed prob diverticula bleed  Vs Coumadin coagulopathy and acute blood loss anemia For colonoscopy in am, npo after MN Rectal bleed is improving Status post blood transfusion, FFP 2 units and vitamin K INR 4.5--1.47--1.11 Monitor hemoglobin and hematocrit closely Coumadin on hold Presently hemoglobin 8.9-8.1.--8.3 Nothing by mouth Appreciate  GI recommendations for colonoscopy.  * Atrial fibrillation Continue rate control medications.  Hold Coumadin.  Cardio is recommending to restart coumadin after colonoscopy as this GI bleed is mostly 2/2 diverticular, will f/u on colonoscopy results tomorrow   *Chronic history of constipation with recent ileus get abdominal series  * CKD stage III  * Prostate cancer with bone metastases. Follow-up with oncology as outpatient.  * COPD is stable. Continue inhalers. Nebulizers when necessary.  * Chronic lower extremity pressure ulcers. Continue collagenase that is being applied as outpatient. Continue Augmentin. Wound care consult.  * DVT prophylaxis. Present the INR 4.5--1.7.--1.11  No heparin or Lovenox once reversed due to GI bleed SCDs    All the records are reviewed and case discussed with Care Management/Social Workerr. Management plans discussed with the patient, family -wife and daughter and they are in agreement.  CODE STATUS:  DO NOT RESUSCITATE TOTAL TIME TAKING CARE OF THIS PATIENT: 35 minutes.   POSSIBLE D/C IN 3-4 DAYS, DEPENDING ON CLINICAL CONDITION.   [name] M.D on 05/17/2015 at 2:33 PM  Between 7am to 6pm - Pager - [phone] After 6pm go to [email or web address] - password [email or web address] [facility]  [place]  [facility]  Office  [phone]  CC: Primary care physician; No primary care provider on file.

## 2015-05-17 NOTE — Care Management Important Message (Signed)
Important Message  Patient Details  Name: Chad Kirby MRN: [id number] Date of Birth: Jul 20, 1929   Medicare Important Message Given:  Yes    [name] 05/17/2015, 11:26 AM

## 2015-05-17 NOTE — Progress Notes (Signed)
[facility]  SUBJECTIVE: I feel okay   Filed Vitals:   05/17/15 0300 05/17/15 0400 05/17/15 0500 05/17/15 0543  BP: 91/58 93/65  98/61  Pulse: 84 75  91  Temp: 97.8 F (36.6 C)   97.8 F (36.6 C)  TempSrc:    Oral  Resp: 15 16  20   Height:      Weight:   106.323 kg (234 lb 6.4 oz)   SpO2: 94% 97%  94%     Intake/Output Summary (Last 24 hours) at 05/17/15 0851 Last data filed at 05/17/15 0800  Gross per 24 hour  Intake   2160 ml  Output   1725 ml  Net    435 ml      PHYSICAL EXAM  General: Well developed, well nourished, in no acute distress HEENT:  Normocephalic and atramatic Neck:  No JVD.  Lungs: Clear bilaterally to auscultation and percussion. Heart: Irregularly irregular rhythm. Normal S1 and S2 without gallops or murmurs.  Abdomen: Bowel sounds are positive, abdomen soft and non-tender  Msk:  Back normal, normal gait. Normal strength and tone for age. Extremities: No clubbing, cyanosis or edema.   Neuro: Alert and oriented X 3. Psych:  Good affect, responds appropriately   LABS: Basic Metabolic Panel:  Recent Labs  05/16/15 0100 05/17/15 0405  NA 140 139  K 4.0 3.7  CL 107 107  CO2 29 26  GLUCOSE 85 82  BUN 35* 25*  CREATININE 1.28* 1.10  CALCIUM 8.2* 7.3*   Liver Function Tests:  Recent Labs  05/15/15 1810  AST 26  ALT 19  ALKPHOS 139*  BILITOT 1.1  PROT 5.1*  ALBUMIN 2.5*   No results for input(s): LIPASE, AMYLASE in the last 72 hours. CBC:  Recent Labs  05/15/15 1810 05/16/15 0100  05/16/15 2201 05/17/15 0405  WBC 9.6 8.0  --   --  8.1  NEUTROABS 8.7*  --   --   --   --   HGB 8.9* 7.4*  < > 8.7* 8.3*  HCT 26.1* 21.7*  < > 25.3* 24.4*  MCV 108.2* 107.2*  --   --  103.6*  PLT 228 203  --   --  178  < > = values in this interval not displayed. Cardiac Enzymes: No results for input(s): CKTOTAL, CKMB, CKMBINDEX, TROPONINI in the last 72 hours. BNP: Invalid input(s): POCBNP D-Dimer: No results for input(s): DDIMER in  the last 72 hours. Hemoglobin A1C: No results for input(s): HGBA1C in the last 72 hours. Fasting Lipid Panel: No results for input(s): CHOL, HDL, LDLCALC, TRIG, CHOLHDL, LDLDIRECT in the last 72 hours. Thyroid Function Tests: No results for input(s): TSH, T4TOTAL, T3FREE, THYROIDAB in the last 72 hours.  Invalid input(s): FREET3 Anemia Panel: No results for input(s): VITAMINB12, FOLATE, FERRITIN, TIBC, IRON, RETICCTPCT in the last 72 hours.  Dg Abd Acute W/chest  05/16/2015  CLINICAL DATA:  Rectal bleeding, decreased bowel movements EXAM: DG ABDOMEN ACUTE W/ 1V CHEST COMPARISON:  05/04/2015 FINDINGS: Cardiac shadow is enlarged. Elevation of the left hemidiaphragm is again noted. Bibasilar atelectatic changes are noted without focal confluent infiltrate. Scattered large and small bowel gas is noted. No free air is seen. No definitive obstructive change is noted. Multiple sclerotic vertebral bodies are noted consistent with metastatic disease. IMPRESSION: Stable bibasilar atelectatic changes. Sclerotic vertebral bodies consistent with metastatic disease. Scattered large and small bowel gas without obstructive change. Electronically Signed   By: Inez Catalina M.D.   On: 05/16/2015 12:10  Echo   TELEMETRY: Atrial fibrillation with controlled ventricular rate  ASSESSMENT AND PLAN:  Active Problems:   Lower GI bleed    1. Chronic atrial fibrillation, rate controlled 2. GI bleed on warfarin, INR 4.18  Recommendations  1. Continue to hold warfarin 2. Colonoscopy pending 3. Restart warfarin pending colonoscopy results   Afifa Truax, MD, PhD, [facility] 05/17/2015 8:51 AM

## 2015-05-17 NOTE — Care Management (Signed)
Patient admitted from [place].  CSW following.  RNCM consult was placed.  RNCM available for discharge planning if needed.

## 2015-05-18 ENCOUNTER — Encounter: Payer: Self-pay | Admitting: *Deleted

## 2015-05-18 ENCOUNTER — Inpatient Hospital Stay: Payer: Medicare Other | Admitting: Anesthesiology

## 2015-05-18 ENCOUNTER — Encounter
Admission: EM | Disposition: A | Discharge status: Skilled Nursing Facility | Payer: Self-pay | Source: Home / Self Care | Attending: Internal Medicine

## 2015-05-18 HISTORY — PX: COLONOSCOPY WITH PROPOFOL: SHX5780

## 2015-05-18 LAB — PROTIME-INR
INR: 1.06
PROTHROMBIN TIME: 14 s (ref 11.4–15.0)

## 2015-05-18 LAB — HEMOGLOBIN AND HEMATOCRIT, BLOOD
HEMATOCRIT: 27 % — AB (ref 40.0–52.0)
HEMATOCRIT: 24.3 % — AB (ref 40.0–52.0)
HEMOGLOBIN: 8 g/dL — AB (ref 13.0–18.0)
Hemoglobin: 9 g/dL — ABNORMAL LOW (ref 13.0–18.0)

## 2015-05-18 LAB — CBC
HCT: 24.9 % — ABNORMAL LOW (ref 40.0–52.0)
HEMOGLOBIN: 8.4 g/dL — AB (ref 13.0–18.0)
MCH: 35.5 pg — AB (ref 26.0–34.0)
MCHC: 33.7 g/dL (ref 32.0–36.0)
MCV: 105.3 fL — ABNORMAL HIGH (ref 80.0–100.0)
Platelets: 188 10*3/uL (ref 150–440)
RBC: 2.36 MIL/uL — AB (ref 4.40–5.90)
RDW: 18.1 % — ABNORMAL HIGH (ref 11.5–14.5)
WBC: 7.5 10*3/uL (ref 3.8–10.6)

## 2015-05-18 LAB — BASIC METABOLIC PANEL
Anion gap: 6 (ref 5–15)
BUN: 18 mg/dL (ref 6–20)
CALCIUM: 6.6 mg/dL — AB (ref 8.9–10.3)
CHLORIDE: 105 mmol/L (ref 101–111)
CO2: 26 mmol/L (ref 22–32)
CREATININE: 0.94 mg/dL (ref 0.61–1.24)
Glucose, Bld: 93 mg/dL (ref 65–99)
POTASSIUM: 3.1 mmol/L — AB (ref 3.5–5.1)
SODIUM: 137 mmol/L (ref 135–145)

## 2015-05-18 SURGERY — COLONOSCOPY WITH PROPOFOL
Anesthesia: General

## 2015-05-18 MED ORDER — ONDANSETRON HCL 4 MG/2ML IJ SOLN: 4.0000 mg | Freq: Once | INTRAMUSCULAR | Status: DC | PRN

## 2015-05-18 MED ORDER — GUAIFENESIN-DM 100-10 MG/5ML PO SYRP
5.0000 mL | ORAL_SOLUTION | ORAL | Status: DC | PRN
  Administered 2015-05-18 – 2015-05-21 (×2): 5 mL via ORAL
  Filled 2015-05-18 (×2): qty 5

## 2015-05-18 MED ORDER — FENTANYL CITRATE (PF) 100 MCG/2ML IJ SOLN
25.0000 ug | INTRAMUSCULAR | Status: DC | PRN
Start: 2015-05-18 — End: 2015-05-18

## 2015-05-18 MED ORDER — PROPOFOL 10 MG/ML IV BOLUS
INTRAVENOUS | Status: DC | PRN
  Administered 2015-05-18: 10 mg via INTRAVENOUS
  Administered 2015-05-18: 20 mg via INTRAVENOUS

## 2015-05-18 MED ORDER — IPRATROPIUM-ALBUTEROL 0.5-2.5 (3) MG/3ML IN SOLN
3.0000 mL | Freq: Once | RESPIRATORY_TRACT | Status: AC
  Administered 2015-05-18: 3 mL via RESPIRATORY_TRACT

## 2015-05-18 MED ORDER — LIDOCAINE HCL (CARDIAC) 20 MG/ML IV SOLN
INTRAVENOUS | Status: DC | PRN
  Administered 2015-05-18: 60 mg via INTRAVENOUS

## 2015-05-18 MED ORDER — PHENYLEPHRINE HCL 10 MG/ML IJ SOLN
INTRAMUSCULAR | Status: DC | PRN
  Administered 2015-05-18 (×5): 200 ug via INTRAVENOUS

## 2015-05-18 MED ORDER — IPRATROPIUM-ALBUTEROL 0.5-2.5 (3) MG/3ML IN SOLN
RESPIRATORY_TRACT | Status: AC
  Filled 2015-05-18: qty 3

## 2015-05-18 NOTE — Progress Notes (Signed)
Resubmitting for SNF auth through [place].  Clinical sent to [phone].  Listed [place] as main contact for further questions and to relay auth to.

## 2015-05-18 NOTE — Op Note (Signed)
[facility]astroenter[facility] Patient Name: Chad Kirby Procedure Date: 05/18/2015 12:48 PM MRN: [id number] Account #: [id number] Date of Birth: 1929-05-10 Admit Type: Inpatient Age: 82 Room: [facility] ENDO ROOM 3 Gender: Male Note Status: Finalized Procedure:            Colonoscopy Indications:          Hematochezia, Acute post hemorrhagic anemia Providers:            Lupita Dawn. Candace Cruise, MD Referring MD:         Wynona Canes. Kym Groom, MD (Referring MD) Medicines:            Monitored Anesthesia Care Complications:        No immediate complications. Procedure:            Pre-Anesthesia Assessment:                       - Prior to the procedure, a History and Physical was                        performed, and patient medications, allergies and                        sensitivities were reviewed. The patient's tolerance of                        previous anesthesia was reviewed.                       - The risks and benefits of the procedure and the                        sedation options and risks were discussed with the                        patient. All questions were answered and informed                        consent was obtained.                       - After reviewing the risks and benefits, the patient                        was deemed in satisfactory condition to undergo the                        procedure.                       After obtaining informed consent, the colonoscope was                        passed under direct vision. Throughout the procedure,                        the patient's blood pressure, pulse, and oxygen                        saturations were monitored continuously. The  Colonoscope was introduced through the anus and                        advanced to the the cecum, identified by appendiceal                        orifice and ileocecal valve. The colonoscopy was                        performed with difficulty due to poor  endoscopic                        visualization. The patient tolerated the procedure                        well. The quality of the bowel preparation was poor. Findings:      A medium polyp was found in the proximal ascending colon. The polyp was       sessile. The polyp was removed with a hot snare. Resection and retrieval       were complete.      A small polyp was found in the descending colon. The polyp was sessile.       The polyp was removed with a hot snare. Resection and retrieval were       complete.      A large polyp was found in the recto-sigmoid colon. The polyp was       sessile. The polyp was removed with a hot snare. Resection and retrieval       were complete.      A few small and large-mouthed diverticula were found in the sigmoid       colon.      Blood everywhere from rectum to cecum. Unable to see to intubate TI.      The exam was otherwise without abnormality. Impression:           - Preparation of the colon was poor.                       - One medium polyp in the proximal ascending colon,                        removed with a hot snare. Resected and retrieved.                       - One small polyp in the descending colon, removed with                        a hot snare. Resected and retrieved.                       - One large polyp at the recto-sigmoid colon, removed                        with a hot snare. Resected and retrieved.                       - Diverticulosis in the sigmoid colon.                       - The examination was otherwise normal.  Recommendation:       - Await pathology results.                       - The findings and recommendations were discussed with                        the patient's family.                       - Hold off on resuming coumadin. Consider EGD also if                        bleeding continues. Procedure Code(s):    --- Professional ---                       [phone], Colonoscopy, flexible; with removal of tumor(s),                         polyp(s), or other lesion(s) by snare technique Diagnosis Code(s):    --- Professional ---                       D12.2, Benign neoplasm of ascending colon                       D12.4, Benign neoplasm of descending colon                       D12.7, Benign neoplasm of rectosigmoid junction                       K92.1, Melena (includes Hematochezia)                       D62, Acute posthemorrhagic anemia                       K57.30, Diverticulosis of large intestine without                        perforation or abscess without bleeding CPT copyright 2016 American Medical Association. All rights reserved. The codes documented in this report are preliminary and upon coder review may  be revised to meet current compliance requirements. Hulen Luster, MD 05/18/2015 1:40:07 PM This report has been signed electronically. Number of Addenda: 0 Note Initiated On: 05/18/2015 12:48 PM Scope Withdrawal Time: 0 hours 17 minutes 11 seconds  Total Procedure Duration: 0 hours 21 minutes 39 seconds       [facility]

## 2015-05-18 NOTE — Transfer of Care (Signed)
Immediate Anesthesia Transfer of Care Note  Patient: Chad Kirby  Procedure(s) Performed: Procedure(s): COLONOSCOPY WITH PROPOFOL (N/A)  Patient Location: Endoscopy Unit  Anesthesia Type:General  Level of Consciousness: awake, alert , oriented and patient cooperative  Airway & Oxygen Therapy: Patient Spontanous Breathing and Patient connected to nasal cannula oxygen  Post-op Assessment: Report given to RN, Post -op Vital signs reviewed and stable and Patient moving all extremities X 4  Post vital signs: Reviewed and stable  Last Vitals:  Filed Vitals:   05/18/15 1234 05/18/15 1349  BP: 107/62   Pulse: 87   Temp: 36.6 C 35.9 C  Resp: 20     Complications: No apparent anesthesia complications

## 2015-05-18 NOTE — Anesthesia Preprocedure Evaluation (Signed)
Anesthesia Evaluation  Patient identified by MRN, date of birth, ID band Patient awake    Reviewed: Allergy & Precautions, NPO status , Patient's Chart, lab work & pertinent test results  Airway Mallampati: III  TM Distance: >3 FB Neck ROM: Full    Dental  (+) Chipped   Pulmonary COPD,  COPD inhaler, former smoker,    Pulmonary exam normal breath sounds clear to auscultation       Cardiovascular Normal cardiovascular exam+ dysrhythmias Atrial Fibrillation      Neuro/Psych negative neurological ROS  negative psych ROS   GI/Hepatic Neg liver ROS,   Endo/Other  negative endocrine ROS  Renal/GU negative Renal ROS     Musculoskeletal Bone mets   Abdominal Normal abdominal exam  (+)   Peds negative pediatric ROS (+)  Hematology  (+) anemia ,   Anesthesia Other Findings Cellulitis of leg  Reproductive/Obstetrics                             Anesthesia Physical Anesthesia Plan  ASA: III  Anesthesia Plan: General   Post-op Pain Management:    Induction: Intravenous  Airway Management Planned: Nasal Cannula  Additional Equipment:   Intra-op Plan:   Post-operative Plan:   Informed Consent: I have reviewed the patients History and Physical, chart, labs and discussed the procedure including the risks, benefits and alternatives for the proposed anesthesia with the patient or authorized representative who has indicated his/her understanding and acceptance.   Dental advisory given  Plan Discussed with: CRNA and Surgeon  Anesthesia Plan Comments:         Anesthesia Quick Evaluation

## 2015-05-18 NOTE — Progress Notes (Signed)
Report given to RN on unit. Pt stable. Pt transported back to room via hospital bed.

## 2015-05-18 NOTE — Op Note (Signed)
Please see procedure note. Pt had blood everywhere throughout the colon. 3 colon polyps removed. Sigmoid tics seen. Unclear as to the source of bleeding. Hold off on resuming coumadin yet. Full liquid diet but NPO after MN. If further bleeding, may schedule EGD tomorrow. Thanks.

## 2015-05-18 NOTE — Clinical Social Work Note (Signed)
CSW spent much time with patient's daughter and daughter in law this morning and then with patient while he was alone. Patient is currently indecisive about whether he wants to return to rehab but also states that he knows his wife cannot care for him. Patient is going down for his colonoscopy and CSW will follow up with family and patient post procedure. Currently awaiting re auth from [facility]. Shela Leff MSW,LCSW [phone]

## 2015-05-18 NOTE — Anesthesia Postprocedure Evaluation (Signed)
Anesthesia Post Note  Patient: Chad Kirby  Procedure(s) Performed: Procedure(s) (LRB): COLONOSCOPY WITH PROPOFOL (N/A)  Patient location during evaluation: PACU Anesthesia Type: General Level of consciousness: awake and alert and oriented Pain management: pain level controlled Vital Signs Assessment: post-procedure vital signs reviewed and stable Respiratory status: spontaneous breathing Cardiovascular status: blood pressure returned to baseline Anesthetic complications: no    Last Vitals:  Filed Vitals:   05/18/15 1359 05/18/15 1409  BP: 98/59 100/64  Pulse: 94 91  Temp:    Resp: 18 15    Last Pain:  Filed Vitals:   05/18/15 1414  PainSc: 0-No pain                 Dencil Cayson

## 2015-05-18 NOTE — Progress Notes (Signed)
[place] at [place] NAME: Chad Kirby    MR#:  [id number]  DATE OF BIRTH:  1930/02/13  SUBJECTIVE:  CHIEF COMPLAINT:  Patient is resting comfortably.denies any abd pain .  No other episodes of rectal bleed overnight. Status post FFP 2 units. Patient had a colonoscopy today  REVIEW OF SYSTEMS:  CONSTITUTIONAL: No fever, fatigue or weakness.  EYES: No blurred or double vision.  EARS, NOSE, AND THROAT: No tinnitus or ear pain.  RESPIRATORY: No cough, shortness of breath, wheezing or hemoptysis.  CARDIOVASCULAR: No chest pain, orthopnea, edema.  GASTROINTESTINAL: No nausea, vomiting, diarrhea or abdominal pain.  GENITOURINARY: No dysuria, hematuria.  ENDOCRINE: No polyuria, nocturia,  HEMATOLOGY: No anemia, easy bruising or bleeding SKIN: No rash or lesion. MUSCULOSKELETAL: No joint pain or arthritis.   NEUROLOGIC: No tingling, numbness, weakness.  PSYCHIATRY: No anxiety or depression.   DRUG ALLERGIES:   Allergies  Allergen Reactions  . Latex Rash    VITALS:  Blood pressure 97/58, pulse 88, temperature 98.2 F (36.8 C), temperature source Oral, resp. rate 18, height 6\' 1"  (1.854 m), weight 106.55 kg (234 lb 14.4 oz), SpO2 98 %.  PHYSICAL EXAMINATION:  GENERAL:  82 y.o.-year-old patient lying in the bed with no acute distress.  EYES: Pupils equal, round, reactive to light and accommodation. No scleral icterus. Extraocular muscles intact.  HEENT: Head atraumatic, normocephalic. Oropharynx and nasopharynx clear.  NECK:  Supple, no jugular venous distention. No thyroid enlargement, no tenderness.  LUNGS: Normal breath sounds bilaterally, no wheezing, rales,rhonchi or crepitation. No use of accessory muscles of respiration.  CARDIOVASCULAR: Irregularly irregular. No murmurs, rubs, or gallops.  ABDOMEN: Soft, nontender, distended. Tympanic bowel sounds present. No organomegaly or mass.  EXTREMITIES: No pedal edema, cyanosis, or  clubbing.  NEUROLOGIC: Cranial nerves II through XII are intact. Muscle strength 5/5 in all extremities. Sensation intact. Gait not checked.  PSYCHIATRIC: The patient is alert and oriented x 3.  SKIN: No obvious rash, lesion, or ulcer.    LABORATORY PANEL:   CBC  Recent Labs Lab 05/18/15 0514  WBC 7.5  HGB 8.4*  HCT 24.9*  PLT 188   ------------------------------------------------------------------------------------------------------------------  Chemistries   Recent Labs Lab 05/15/15 1810  05/18/15 0514  NA 138  < > 137  K 4.5  < > 3.1*  CL 102  < > 105  CO2 29  < > 26  GLUCOSE 103*  < > 93  BUN 38*  < > 18  CREATININE 1.49*  < > 0.94  CALCIUM 8.2*  < > 6.6*  AST 26  --   --   ALT 19  --   --   ALKPHOS 139*  --   --   BILITOT 1.1  --   --   < > = values in this interval not displayed. ------------------------------------------------------------------------------------------------------------------  Cardiac Enzymes No results for input(s): TROPONINI in the last 168 hours. ------------------------------------------------------------------------------------------------------------------  RADIOLOGY:  No results found.  EKG:   Orders placed or performed during the hospital encounter of 05/01/15  . ED EKG  . ED EKG  . EKG 12-Lead  . EKG 12-Lead    ASSESSMENT AND PLAN:   * Lower GI bleed -unclear source- prob colon polyps  Vs Coumadin coagulopathy and acute blood loss anemia Patient had a colonoscopy today and is status post 3 colon polyp removal. Patient was bleeding throughout the colon Will get stat hemoglobin and hematocrit and check CBC in a.m. , will transfuse  PRBC if needed  Anne P after midnight for possible EGD in a.m.  Status post blood transfusion, FFP 2 units and vitamin K INR 4.5--1.47--1.11--1.06  Monitor hemoglobin and hematocrit closely Coumadin on hold Presently hemoglobin 8.9-8.1.--8.3- 8.4  Nothing by mouth after midnight  Appreciate   GI recommendations for possible EGD in a.m.  * Atrial fibrillation Continue rate control medications.  Hold Coumadin.   *Chronic history of constipation with recent ileus get abdominal series  * CKD stage III  * Prostate cancer with bone metastases. Follow-up with oncology as outpatient.  * COPD is stable. Continue inhalers. Nebulizers when necessary.  * Chronic lower extremity pressure ulcers. Continue collagenase that is being applied as outpatient. Continue Augmentin. Wound care consult.  * DVT prophylaxis. Present the INR 4.5--1.7.--1.11  No heparin or Lovenox once reversed due to GI bleed SCDs    All the records are reviewed and case discussed with Care Management/Social Workerr. Management plans discussed with the patient, family -daughter and they are in agreement.  CODE STATUS:  DO NOT RESUSCITATE TOTAL TIME TAKING CARE OF THIS PATIENT: 35 minutes.   POSSIBLE D/C IN 3-4 DAYS, DEPENDING ON CLINICAL CONDITION.   [name] M.D on 05/18/2015 at 3:16 PM  Between 7am to 6pm - Pager - [phone] After 6pm go to [email or web address] - password [email or web address] [facility]  [place] [facility]  Office  [phone]  CC: Primary care physician; No primary care provider on file.

## 2015-05-19 ENCOUNTER — Inpatient Hospital Stay: Payer: Medicare Other | Admitting: Anesthesiology

## 2015-05-19 ENCOUNTER — Encounter
Admission: EM | Disposition: A | Discharge status: Skilled Nursing Facility | Payer: Self-pay | Source: Home / Self Care | Attending: Internal Medicine

## 2015-05-19 HISTORY — PX: ESOPHAGOGASTRODUODENOSCOPY: SHX5428

## 2015-05-19 LAB — BASIC METABOLIC PANEL
ANION GAP: 5 (ref 5–15)
BUN: 12 mg/dL (ref 6–20)
CHLORIDE: 106 mmol/L (ref 101–111)
CO2: 25 mmol/L (ref 22–32)
Calcium: 6.4 mg/dL — CL (ref 8.9–10.3)
Creatinine, Ser: 0.9 mg/dL (ref 0.61–1.24)
GFR calc non Af Amer: >60 mL/min (ref >60)
Glucose, Bld: 93 mg/dL (ref 65–99)
POTASSIUM: 3.1 mmol/L — AB (ref 3.5–5.1)
SODIUM: 136 mmol/L (ref 135–145)

## 2015-05-19 LAB — CBC
HCT: 24.7 % — ABNORMAL LOW (ref 40.0–52.0)
HEMOGLOBIN: 8.3 g/dL — AB (ref 13.0–18.0)
MCH: 35.8 pg — AB (ref 26.0–34.0)
MCHC: 33.6 g/dL (ref 32.0–36.0)
MCV: 106.3 fL — ABNORMAL HIGH (ref 80.0–100.0)
PLATELETS: 174 10*3/uL (ref 150–440)
RBC: 2.33 MIL/uL — AB (ref 4.40–5.90)
RDW: 18.3 % — ABNORMAL HIGH (ref 11.5–14.5)
WBC: 6.5 10*3/uL (ref 3.8–10.6)

## 2015-05-19 LAB — HEMOGLOBIN AND HEMATOCRIT, BLOOD
HCT: 25.5 % — ABNORMAL LOW (ref 40.0–52.0)
HCT: 24.1 % — ABNORMAL LOW (ref 40.0–52.0)
HEMOGLOBIN: 8.5 g/dL — AB (ref 13.0–18.0)
Hemoglobin: 8.1 g/dL — ABNORMAL LOW (ref 13.0–18.0)

## 2015-05-19 LAB — SURGICAL PATHOLOGY

## 2015-05-19 SURGERY — EGD (ESOPHAGOGASTRODUODENOSCOPY)
Anesthesia: General

## 2015-05-19 MED ORDER — ALBUTEROL SULFATE (2.5 MG/3ML) 0.083% IN NEBU: 2.5000 mg | INHALATION_SOLUTION | RESPIRATORY_TRACT | Status: DC | PRN

## 2015-05-19 MED ORDER — PHENYLEPHRINE HCL 10 MG/ML IJ SOLN
INTRAMUSCULAR | Status: DC | PRN
  Administered 2015-05-19: 10 ug via INTRAVENOUS

## 2015-05-19 MED ORDER — POTASSIUM CHLORIDE 20 MEQ PO PACK
40.0000 meq | PACK | Freq: Once | ORAL | Status: DC
  Filled 2015-05-19: qty 2

## 2015-05-19 MED ORDER — PANTOPRAZOLE SODIUM 40 MG IV SOLR
40.0000 mg | Freq: Two times a day (BID) | INTRAVENOUS | Status: DC
  Administered 2015-05-19 – 2015-05-23 (×9): 40 mg via INTRAVENOUS
  Filled 2015-05-19 (×12): qty 40

## 2015-05-19 MED ORDER — PROPOFOL 500 MG/50ML IV EMUL
INTRAVENOUS | Status: DC | PRN
  Administered 2015-05-19: 50 ug via INTRAVENOUS

## 2015-05-19 MED ORDER — LIDOCAINE HCL (CARDIAC) 20 MG/ML IV SOLN
INTRAVENOUS | Status: DC | PRN
  Administered 2015-05-19: 60 mg via INTRAVENOUS

## 2015-05-19 NOTE — Progress Notes (Signed)
PT Cancellation Note  Patient Details Name: LAINE RASLER MRN: [id number] DOB: November 11, 1929   Cancelled Treatment:    Reason Eval/Treat Not Completed: Patient at procedure or test/unavailable. Pt's chart reviewed. Upon PT's arrival pt notified by RN that patient has been taken to receive an upper GI procedure and is not available. PT will f/u tomorrow and resume if appropriate.   Neoma Laming, PT, DPT  05/19/2015, 11:38 AM [phone]

## 2015-05-19 NOTE — Progress Notes (Signed)
[place] at [place] NAME: Chad Kirby    MR#:  [id number]  DATE OF BIRTH:  Feb 05, 1930  SUBJECTIVE:  CHIEF COMPLAINT:  Patient is resting comfortably.denies any abd pain .  No other episodes of rectal bleed overnight. Status post FFP 2 units. Patient had a colonoscopy 05/19/15. EGD done 3/16  REVIEW OF SYSTEMS:  CONSTITUTIONAL: No fever, fatigue or weakness.  EYES: No blurred or double vision.  EARS, NOSE, AND THROAT: No tinnitus or ear pain.  RESPIRATORY: No cough, shortness of breath, wheezing or hemoptysis.  CARDIOVASCULAR: No chest pain, orthopnea, edema.  GASTROINTESTINAL: No nausea, vomiting, diarrhea or abdominal pain.  GENITOURINARY: No dysuria, hematuria.  ENDOCRINE: No polyuria, nocturia,  HEMATOLOGY: No anemia, easy bruising or bleeding SKIN: No rash or lesion. MUSCULOSKELETAL: No joint pain or arthritis.   NEUROLOGIC: No tingling, numbness, weakness.  PSYCHIATRY: No anxiety or depression.   DRUG ALLERGIES:   Allergies  Allergen Reactions  . Latex Rash    VITALS:  Blood pressure 93/62, pulse 80, temperature 98.2 F (36.8 C), temperature source Oral, resp. rate 17, height 6\' 1"  (1.854 m), weight 104.327 kg (230 lb), SpO2 92 %.  PHYSICAL EXAMINATION:  GENERAL:  82 y.o.-year-old patient lying in the bed with no acute distress.  EYES: Pupils equal, round, reactive to light and accommodation. No scleral icterus. Extraocular muscles intact.  HEENT: Head atraumatic, normocephalic. Oropharynx and nasopharynx clear.  NECK:  Supple, no jugular venous distention. No thyroid enlargement, no tenderness.  LUNGS: Normal breath sounds bilaterally, no wheezing, rales,rhonchi or crepitation. No use of accessory muscles of respiration.  CARDIOVASCULAR: Irregularly irregular. No murmurs, rubs, or gallops.  ABDOMEN: Soft, nontender, distended. Tympanic bowel sounds present. No organomegaly or mass.  EXTREMITIES: No pedal edema,  cyanosis, or clubbing.  NEUROLOGIC: Cranial nerves II through XII are intact. Muscle strength 5/5 in all extremities. Sensation intact. Gait not checked.  PSYCHIATRIC: The patient is alert and oriented x 3.  SKIN: No obvious rash, lesion, or ulcer.    LABORATORY PANEL:   CBC  Recent Labs Lab 05/19/15 0434  WBC 6.5  HGB 8.3*  HCT 24.7*  PLT 174   ------------------------------------------------------------------------------------------------------------------  Chemistries   Recent Labs Lab 05/15/15 1810  05/19/15 0434  NA 138  < > 136  K 4.5  < > 3.1*  CL 102  < > 106  CO2 29  < > 25  GLUCOSE 103*  < > 93  BUN 38*  < > 12  CREATININE 1.49*  < > 0.90  CALCIUM 8.2*  < > 6.4*  AST 26  --   --   ALT 19  --   --   ALKPHOS 139*  --   --   BILITOT 1.1  --   --   < > = values in this interval not displayed. ------------------------------------------------------------------------------------------------------------------  Cardiac Enzymes No results for input(s): TROPONINI in the last 168 hours. ------------------------------------------------------------------------------------------------------------------  RADIOLOGY:  No results found.  EKG:   Orders placed or performed during the hospital encounter of 05/01/15  . ED EKG  . ED EKG  . EKG 12-Lead  . EKG 12-Lead    ASSESSMENT AND PLAN:   *  GI bleed -- prob from duodenal ulcer / colon polyps  Vs Coumadin coagulopathy and acute blood loss anemia Patient had a colonoscopy 3/15  and  EGD  Today with large duodenal ulcer s/p cauterization ,is status post 3 colon polyp removal.  Started patient on a  clear liquid diet Will discuss with cardiology regarding resuming Coumadin in view of large duodenal ulcer which is concerning Will get stat hemoglobin and hematocrit and check CBC in a.m. , will transfuse PRBC if needed  Status post blood transfusion, FFP 2 units and vitamin K INR 4.5--1.47--1.11--1.06  Coumadin on  hold Presently hemoglobin 8.9-8.1.--8.3- 8.4 -8.3 * Atrial fibrillation Continue rate control medications.  Hold Coumadin.   *Chronic history of constipation with recent ileus get abdominal series  * CKD stage III  * Prostate cancer with bone metastases. Follow-up with oncology as outpatient.  * COPD is stable. Continue inhalers. Nebulizers when necessary.  * Chronic lower extremity pressure ulcers. Continue collagenase that is being applied as outpatient. Continue Augmentin. Wound care consult.  * DVT prophylaxis. Present the INR 4.5--1.7.--1.11  No heparin or Lovenox once reversed due to GI bleed SCDs    All the records are reviewed and case discussed with Care Management/Social Workerr. Management plans discussed with the patient, family -daughter and they are in agreement.  CODE STATUS:  DO NOT RESUSCITATE TOTAL TIME TAKING CARE OF THIS PATIENT: 35 minutes.   POSSIBLE D/C IN 3-4 DAYS, DEPENDING ON CLINICAL CONDITION.   [name] M.D on 05/19/2015 at 5:04 PM  Between 7am to 6pm - Pager - [phone] After 6pm go to [email or web address] - password [email or web address] [facility]  [place] [facility]  Office  [phone]  CC: Primary care physician; No primary care provider on file.

## 2015-05-19 NOTE — Anesthesia Preprocedure Evaluation (Addendum)
Anesthesia Evaluation  Patient identified by MRN, date of birth, ID band Patient awake    Reviewed: Allergy & Precautions, H&P , NPO status , Patient's Chart, lab work & pertinent test results, reviewed documented beta blocker date and time   Airway Mallampati: III   Neck ROM: full    Dental  (+) Teeth Intact   Pulmonary neg pulmonary ROS, COPD, former smoker,    Pulmonary exam normal        Cardiovascular negative cardio ROS Normal cardiovascular examAtrial Fibrillation  Rate:Normal     Neuro/Psych negative neurological ROS  negative psych ROS   GI/Hepatic negative GI ROS, Neg liver ROS,   Endo/Other  negative endocrine ROS  Renal/GU negative Renal ROS  negative genitourinary   Musculoskeletal   Abdominal   Peds  Hematology negative hematology ROS (+)   Anesthesia Other Findings Past Medical History:   COPD (chronic obstructive pulmonary disease) (*              Cancer ([place])                                                   Comment:Prostate with bone mets-current chemo treatment   Hyperlipidemia                                               Atrial fibrillation ([place])                                  Past Surgical History:   REPLACEMENT TOTAL KNEE                          Right            BMI    Body Mass Index   30.35 kg/m 2     Reproductive/Obstetrics                             Anesthesia Physical Anesthesia Plan  ASA: III  Anesthesia Plan: General   Post-op Pain Management:    Induction:   Airway Management Planned:   Additional Equipment:   Intra-op Plan:   Post-operative Plan:   Informed Consent: I have reviewed the patients History and Physical, chart, labs and discussed the procedure including the risks, benefits and alternatives for the proposed anesthesia with the patient or authorized representative who has indicated his/her understanding and acceptance.    Dental Advisory Given  Plan Discussed with: CRNA  Anesthesia Plan Comments:         Anesthesia Quick Evaluation

## 2015-05-19 NOTE — Care Management Important Message (Signed)
Important Message  Patient Details  Name: Chad Kirby MRN: [id number] Date of Birth: October 29, 1929   Medicare Important Message Given:  Yes    [name] A Daire Okimoto 05/19/2015, 11:21 AM

## 2015-05-19 NOTE — Progress Notes (Signed)
Per lab, Pt's calcium 6.4 this AM. MD notified.

## 2015-05-19 NOTE — Transfer of Care (Signed)
Immediate Anesthesia Transfer of Care Note  Patient: Chad Kirby  Procedure(s) Performed: Procedure(s): ESOPHAGOGASTRODUODENOSCOPY (EGD) (N/A)  Patient Location: PACU and Endoscopy Unit  Anesthesia Type:General  Level of Consciousness: awake, alert  and oriented  Airway & Oxygen Therapy: Patient Spontanous Breathing and Patient connected to nasal cannula oxygen  Post-op Assessment: Report given to RN and Post -op Vital signs reviewed and stable  Post vital signs: Reviewed and stable  Last Vitals:  Filed Vitals:   05/19/15 1120 05/19/15 1145  BP: 108/59 88/53  Pulse: 78 86  Temp: 35.7 C 36.7 C  Resp: 16 20    Complications: No apparent anesthesia complications

## 2015-05-19 NOTE — Op Note (Signed)
[facility] Gastroenterology Patient Name: Chad Kirby Procedure Date: 05/19/2015 11:24 AM MRN: [id number] Account #: [id number] Date of Birth: 1929/05/20 Admit Type: Outpatient Age: 82 Room: [facility] ENDO ROOM 4 Gender: Male Note Status: Finalized Procedure:            Upper GI endoscopy Indications:          Acute post hemorrhagic anemia, Hematochezia Providers:            Lupita Dawn. Candace Cruise, MD Referring MD:         [name] (Referring MD) Medicines:            Monitored Anesthesia Care Complications:        No immediate complications. Procedure:            Pre-Anesthesia Assessment:                       - Prior to the procedure, a History and Physical was                        performed, and patient medications, allergies and                        sensitivities were reviewed. The patient's tolerance of                        previous anesthesia was reviewed.                       - The risks and benefits of the procedure and the                        sedation options and risks were discussed with the                        patient. All questions were answered and informed                        consent was obtained.                       - After reviewing the risks and benefits, the patient                        was deemed in satisfactory condition to undergo the                        procedure.                       After obtaining informed consent, the endoscope was                        passed under direct vision. Throughout the procedure,                        the patient's blood pressure, pulse, and oxygen                        saturations were monitored continuously. The Endoscope  was introduced through the mouth, and advanced to the                        second part of duodenum. The upper GI endoscopy was                        accomplished without difficulty. The patient tolerated                        the procedure  well. Findings:      The examined esophagus was normal.      Mild inflammation characterized by erythema was found in the gastric       antrum.      Localized mild inflammation characterized by erythema was found in the       gastric fundus.      The exam was otherwise without abnormality.      One oozing cratered duodenal ulcer with a visible vessel was found in       the duodenal bulb. Coagulation for hemostasis using heater probe was       successful. Could not get scope around ulcer to reach distal duodenum. Impression:           - Normal esophagus.                       - Gastritis.                       - Gastritis.                       - The examination was otherwise normal.                       - One oozing duodenal ulcer with a visible vessel.                        Treated with a heater probe.                       - No specimens collected. Recommendation:       - Observe patient's clinical course.                       - Continue present medications.                       - The findings and recommendations were discussed with                        the patient's family.                       - Hold off on coumadin. Procedure Code(s):    --- Professional ---                       [phone], Esophagogastroduodenoscopy, flexible, transoral;                        with control of bleeding, any method Diagnosis Code(s):    --- Professional ---  K29.70, Gastritis, unspecified, without bleeding                       K26.4, Chronic or unspecified duodenal ulcer with                        hemorrhage                       D62, Acute posthemorrhagic anemia                       K92.1, Melena (includes Hematochezia) CPT copyright 2016 American Medical Association. All rights reserved. The codes documented in this report are preliminary and upon coder review may  be revised to meet current compliance requirements. Hulen Luster, MD 05/19/2015 11:41:39 AM This report  has been signed electronically. Number of Addenda: 0 Note Initiated On: 05/19/2015 11:24 AM      [facility]

## 2015-05-19 NOTE — Op Note (Signed)
EGD done due to slow drop in hgb as well as no obvious bleeding source seen during colonoscopy. EGD showed very large duodenal bulb ulcer with visible vessel. This was cauterized. Resume liquid diet today. Advance diet slowly. Will need to hold coumadin longer. Will need to discuss with cardiology about this. Will need protonix bid for minimum of 8 weeks. May need repeat EGD in 2 months to make sure ulcer has healed.

## 2015-05-20 LAB — BASIC METABOLIC PANEL
Anion gap: 5 (ref 5–15)
BUN: 9 mg/dL (ref 6–20)
CALCIUM: 6 mg/dL — AB (ref 8.9–10.3)
CHLORIDE: 102 mmol/L (ref 101–111)
CO2: 25 mmol/L (ref 22–32)
CREATININE: 0.96 mg/dL (ref 0.61–1.24)
GFR calc non Af Amer: >60 mL/min (ref >60)
Glucose, Bld: 102 mg/dL — ABNORMAL HIGH (ref 65–99)
Potassium: 3 mmol/L — ABNORMAL LOW (ref 3.5–5.1)
SODIUM: 132 mmol/L — AB (ref 135–145)

## 2015-05-20 LAB — CBC
HEMATOCRIT: 24.8 % — AB (ref 40.0–52.0)
Hemoglobin: 8.3 g/dL — ABNORMAL LOW (ref 13.0–18.0)
MCH: 35.2 pg — ABNORMAL HIGH (ref 26.0–34.0)
MCHC: 33.5 g/dL (ref 32.0–36.0)
MCV: 105.1 fL — AB (ref 80.0–100.0)
Platelets: 150 10*3/uL (ref 150–440)
RBC: 2.36 MIL/uL — AB (ref 4.40–5.90)
RDW: 18.5 % — AB (ref 11.5–14.5)
WBC: 6.1 10*3/uL (ref 3.8–10.6)

## 2015-05-20 LAB — MAGNESIUM
MAGNESIUM: 2.2 mg/dL (ref 1.7–2.4)
Magnesium: 1.5 mg/dL — ABNORMAL LOW (ref 1.7–2.4)

## 2015-05-20 LAB — ALBUMIN: Albumin: 2.4 g/dL — ABNORMAL LOW (ref 3.5–5.0)

## 2015-05-20 LAB — PHOSPHORUS: PHOSPHORUS: 1.2 mg/dL — AB (ref 2.5–4.6)

## 2015-05-20 LAB — POTASSIUM: Potassium: 4.2 mmol/L (ref 3.5–5.1)

## 2015-05-20 MED ORDER — DEXTROSE 5 % IV SOLN
30.0000 mmol | Freq: Once | INTRAVENOUS | Status: AC
  Administered 2015-05-20: 30 mmol via INTRAVENOUS
  Filled 2015-05-20: qty 10

## 2015-05-20 MED ORDER — MAGNESIUM SULFATE 4 GM/100ML IV SOLN
4.0000 g | Freq: Once | INTRAVENOUS | Status: AC
  Administered 2015-05-20: 4 g via INTRAVENOUS
  Filled 2015-05-20: qty 100

## 2015-05-20 MED ORDER — POTASSIUM CHLORIDE 20 MEQ PO PACK
40.0000 meq | PACK | ORAL | Status: AC
  Administered 2015-05-20 (×2): 40 meq via ORAL
  Filled 2015-05-20: qty 2

## 2015-05-20 MED ORDER — ASPIRIN EC 81 MG PO TBEC
81.0000 mg | DELAYED_RELEASE_TABLET | Freq: Every day | ORAL | Status: DC
  Administered 2015-05-23: 81 mg via ORAL
  Filled 2015-05-20: qty 1

## 2015-05-20 MED ORDER — SODIUM CHLORIDE 0.9 % IV SOLN
2.0000 g | Freq: Once | INTRAVENOUS | Status: AC
  Administered 2015-05-20: 2 g via INTRAVENOUS
  Filled 2015-05-20: qty 20

## 2015-05-20 MED ORDER — FUROSEMIDE 40 MG PO TABS
20.0000 mg | ORAL_TABLET | Freq: Two times a day (BID) | ORAL | Status: DC
  Administered 2015-05-20 – 2015-05-23 (×6): 20 mg via ORAL
  Filled 2015-05-20 (×6): qty 1

## 2015-05-20 NOTE — Progress Notes (Signed)
[place] [facility] [place] NAME: Chad Kirby    MR#:  [id number]  DATE OF BIRTH:  Apr 08, 1929  SUBJECTIVE:  CHIEF COMPLAINT:  Patient is resting comfortably.denies any abd pain . Hemoglobin is stable at 8.3. Tolerating clear liquid diet .family members at bedside . Status post FFP 2 units. Patient had a colonoscopy 05/19/15. EGD done 3/16  REVIEW OF SYSTEMS:  CONSTITUTIONAL: No fever, fatigue or weakness.  EYES: No blurred or double vision.  EARS, NOSE, AND THROAT: No tinnitus or ear pain.  RESPIRATORY: No cough, shortness of breath, wheezing or hemoptysis.  CARDIOVASCULAR: No chest pain, orthopnea, edema.  GASTROINTESTINAL: No nausea, vomiting, diarrhea or abdominal pain.  GENITOURINARY: No dysuria, hematuria.  ENDOCRINE: No polyuria, nocturia,  HEMATOLOGY: No anemia, easy bruising or bleeding SKIN: No rash or lesion. MUSCULOSKELETAL: No joint pain or arthritis.   NEUROLOGIC: No tingling, numbness, weakness.  PSYCHIATRY: No anxiety or depression.   DRUG ALLERGIES:   Allergies  Allergen Reactions  . Latex Rash    VITALS:  Blood pressure 120/68, pulse 89, temperature 98.1 F (36.7 C), temperature source Oral, resp. rate 18, height 6\' 1"  (1.854 m), weight 104.327 kg (230 lb), SpO2 96 %.  PHYSICAL EXAMINATION:  GENERAL:  82 y.o.-year-old patient lying in the bed with no acute distress.  EYES: Pupils equal, round, reactive to light and accommodation. No scleral icterus. Extraocular muscles intact.  HEENT: Head atraumatic, normocephalic. Oropharynx and nasopharynx clear.  NECK:  Supple, no jugular venous distention. No thyroid enlargement, no tenderness.  LUNGS: Normal breath sounds bilaterally, no wheezing, rales,rhonchi or crepitation. No use of accessory muscles of respiration.  CARDIOVASCULAR: Irregularly irregular. No murmurs, rubs, or gallops.  ABDOMEN: Soft, nontender, distended. Tympanic bowel sounds present. No organomegaly  or mass.  EXTREMITIES: No pedal edema, cyanosis, or clubbing.  NEUROLOGIC: Cranial nerves II through XII are intact. Muscle strength 5/5 in all extremities. Sensation intact. Gait not checked.  PSYCHIATRIC: The patient is alert and oriented x 3.  SKIN: No obvious rash, lesion, or ulcer.    LABORATORY PANEL:   CBC  Recent Labs Lab 05/20/15 0423  WBC 6.1  HGB 8.3*  HCT 24.8*  PLT 150   ------------------------------------------------------------------------------------------------------------------  Chemistries   Recent Labs Lab 05/15/15 1810  05/20/15 0423  NA 138  < > 132*  K 4.5  < > 3.0*  CL 102  < > 102  CO2 29  < > 25  GLUCOSE 103*  < > 102*  BUN 38*  < > 9  CREATININE 1.49*  < > 0.96  CALCIUM 8.2*  < > 6.0*  MG  --   --  1.5*  AST 26  --   --   ALT 19  --   --   ALKPHOS 139*  --   --   BILITOT 1.1  --   --   < > = values in this interval not displayed. ------------------------------------------------------------------------------------------------------------------  Cardiac Enzymes No results for input(s): TROPONINI in the last 168 hours. ------------------------------------------------------------------------------------------------------------------  RADIOLOGY:  No results found.  EKG:   Orders placed or performed during the hospital encounter of 05/01/15  . ED EKG  . ED EKG  . EKG 12-Lead  . EKG 12-Lead    ASSESSMENT AND PLAN:   *  GI bleed -- prob from duodenal ulcer / colon polyps  Vs Coumadin coagulopathy and acute blood loss anemia Patient had a colonoscopy 3/15  and  EGD  05/19/2015  with large  duodenal ulcer s/p cauterization ,is status post 3 colon polyp removal.  Started patient on a clear liquid diet .Tolerating well advance to full liquid diet discussed with cardiology Dr. Clayborn Bigness who is agreeable to discontinue Coumadin at this time. He is recommending to resume Coumadin at gastroenterology discretion, in view of bleeding duodenal  ulcer  Coumadin discontinued  Gastroenterology is recommending to start the patient on 81 mg of enteric-coated aspirin starting from Monday  Presently hemoglobin 8.9-8.1.--8.3- 8.4 -8.3 Check CBC in a.m.  Check stool for H. pylori antigen Dietary consult is placed  * Atrial fibrillation Continue rate control medications.  Discontinue  Coumadin.  cardiology is agreeable   start baby aspirin enteric-coated starting from Monday as recommended by gastroenterology  *Chronic history of constipation with recent ileus get abdominal series  *Bilateral lower extremity edema-Lasix dose is increased to 20 minutes by mouth twice a day Encouraged patient to keep his legs elevated and by 3 pillows  * CKD stage III  * Prostate cancer with bone metastases. Follow-up with oncology as outpatient.  * COPD is stable. Continue inhalers. Nebulizers when necessary.  * Chronic lower extremity pressure ulcers. Continue collagenase that is being applied as outpatient. Continue Augmentin. Wound care consult.  *Deconditioning with physical therapy  * DVT prophylaxis. Present the INR 4.5--1.7.--1.11  No heparin or Lovenox once reversed due to GI bleed SCDs      All the records are reviewed and case discussed with Care Management/Social Workerr. Management plans discussed with the patient, wife and daughter-in-law at bedside  and they are in agreement.  CODE STATUS:  DO NOT RESUSCITATE TOTAL TIME TAKING CARE OF THIS PATIENT: 35 minutes.   POSSIBLE D/C IN 3-4 DAYS, DEPENDING ON CLINICAL CONDITION.   [name] M.D on 05/20/2015 at 4:10 PM  Between 7am to 6pm - Pager - [phone] After 6pm go to [email or web address] - password [email or web address] [facility]  [facility]  Office  [phone]  CC: Primary care physician; No primary care provider on file.

## 2015-05-20 NOTE — Progress Notes (Signed)
Notifed Dr. Jannifer Franklin, Ca 6.0. Notes will continue to observe.

## 2015-05-20 NOTE — Progress Notes (Signed)
Resubmitted clinical for SNF re-auth to [facility] [phone]).

## 2015-05-20 NOTE — Clinical Social Work Note (Signed)
Reauthorization not received prior to ending of day. Patient cannot discharge to [facility] without re auth from [facility]. Shela Leff MSW,LCSW [phone]

## 2015-05-20 NOTE — Clinical Social Work Note (Signed)
GI physician has stated that patient will be here until Monday and will make a decision on Monday regarding what anticoagulant to place patient on. Had obtained reauth for patient from [facility] but because he did not return to [facility] yesterday, [facility] is requiring reauth to be obtained again. CSW awaiting on reauth from [place]. Family are aware.  Shela Leff MSW,LCSW [phone]

## 2015-05-20 NOTE — Progress Notes (Addendum)
Physical Therapy Treatment Patient Details Name: Chad Kirby MRN: [id number] DOB: 10/15/1929 Today's Date: 05/20/2015    History of Present Illness 82 yo M recently admitted to hospital at the end of February for ileus and cellulitis, discharging on 3/3 to STR at [facility]. Pt returned to ED on 3/12 from [facility] for  a rectal bleed. PMH includes COPD, a-fib, prostate cancer with bone metastasis.    PT Comments    Pt demonstrates gradual progress towards goals with increase standing ability and tolerance. He remains limited by weakness, fatigue and low activity tolerance. With encouragement pt is agreeable to participate in therapy. He requires min A +2 for STS and is able to tolerate standing for up to 2 minutes. Seated BP 109/68, HR 110, SpO2 94% on 3.5L. After standing BP 101/64, HR 76 and SpO2 93%. Pt will benefit from continued skilled PT to increase functional mobility and return to PLOF. PT discussed with SW for the potential need for a consult related to pt's emotional/depressive state of mind.  Follow Up Recommendations  SNF     Equipment Recommendations  None recommended by PT    Recommendations for Other Services       Precautions / Restrictions Precautions Precautions: Fall Restrictions Weight Bearing Restrictions: No    Mobility  Bed Mobility               General bed mobility comments: received in chair  Transfers Overall transfer level: Needs assistance Equipment used: Rolling walker (2 wheeled) Transfers: Sit to/from Stand Sit to Stand: Min assist;+2 physical assistance         General transfer comment: cues for hand placement, postural correction and safe technique, anterior weight shift cues  Ambulation/Gait             General Gait Details: Pt declined to attempt ambulation due to feeling as though his knees were going to give out on him.   Stairs            Wheelchair Mobility    Modified Rankin (Stroke Patients  Only)       Balance Overall balance assessment: Needs assistance Sitting-balance support: No upper extremity supported;Feet supported Sitting balance-Leahy Scale: Fair     Standing balance support: Bilateral upper extremity supported Standing balance-Leahy Scale: Poor Standing balance comment: posterior lean; limited anterior weight shifting with cues                    Cognition Arousal/Alertness: Awake/alert Behavior During Therapy: Agitated;WFL for tasks assessed/performed Overall Cognitive Status: Within Functional Limits for tasks assessed                      Exercises Other Exercises Other Exercises: Static standing x3 for 30 seconds to 2 minutes with min A +2 and FWW for B UE support. Demonstrated posterior lean and minimal ability to correct with cues. Pt c/o feeling as though is legs are going to give out on him. Extended therapeutic rest breaks required for energy conservation. Other Exercises: Discussed POC and importance of trying to increase activity in order to build strength and functional mobility. Pt and family demonstrated understanding of education provided.    General Comments General comments (skin integrity, edema, etc.): B LE edema      Pertinent Vitals/Pain Pain Assessment: 0-10 Pain Score: 5  Pain Location: back Pain Descriptors / Indicators: Aching Pain Intervention(s): Limited activity within patient's tolerance;Monitored during session    Home Living  Prior Function            PT Goals (current goals can now be found in the care plan section) Acute Rehab PT Goals Patient Stated Goal: to walk again PT Goal Formulation: With patient Time For Goal Achievement: 05/31/15 Potential to Achieve Goals: Fair Progress towards PT goals: Progressing toward goals    Frequency  Min 2X/week    PT Plan Current plan remains appropriate    Co-evaluation             End of Session Equipment Utilized  During Treatment: Gait belt;Oxygen Activity Tolerance: Patient limited by fatigue Patient left: in chair;with call bell/phone within reach;with chair alarm set;with family/visitor present     Time: 1555-1620 PT Time Calculation (min) (ACUTE ONLY): 25 min  Charges:  $Therapeutic Activity: 23-37 mins                    G Codes:      Neoma Laming, PT, DPT  05/20/2015, 4:49 PM [phone]

## 2015-05-20 NOTE — Anesthesia Postprocedure Evaluation (Signed)
Anesthesia Post Note  Patient: Chad Kirby  Procedure(s) Performed: Procedure(s) (LRB): ESOPHAGOGASTRODUODENOSCOPY (EGD) (N/A)  Patient location during evaluation: PACU Anesthesia Type: General Level of consciousness: awake and alert Pain management: pain level controlled Vital Signs Assessment: post-procedure vital signs reviewed and stable Respiratory status: spontaneous breathing, nonlabored ventilation, respiratory function stable and patient connected to nasal cannula oxygen Cardiovascular status: blood pressure returned to baseline and stable Postop Assessment: no signs of nausea or vomiting Anesthetic complications: no    Last Vitals:  Filed Vitals:   05/20/15 0540 05/20/15 1215  BP: 100/58 120/68  Pulse: 90 89  Temp:  36.7 C  Resp:  18    Last Pain:  Filed Vitals:   05/20/15 1835  PainSc: [name]

## 2015-05-20 NOTE — Progress Notes (Addendum)
Patient A/O, no noted distress. Denies pain. RLE dressing changed as ordered. LUE changed dressing. BLE edema. Staff will continue to monitor and meet needs. Administered sleeping aid, effective. Scattered bruising. Held cardizem and metoprolol resulted BP low.

## 2015-05-20 NOTE — Plan of Care (Signed)
Problem: Food- and Nutrition-Related Knowledge Deficit (NB-1.1) Goal: Nutrition education Formal process to instruct or train a patient/client in a skill or to impart knowledge to help patients/clients voluntarily manage or modify food choices and eating behavior to maintain or improve health. Outcome: Completed/Met Date Met:  05/20/15   Nutrition Education Note  RD consulted for nutrition education regarding a Low Fiber/Low Residue diet.   RD provided "Low Fiber Nutrition Therapy" handout from the Academy of [employer]etics. Discussed nutrition therapy to reduce the irritation of the gastrointestinal tract to promote healing. Provided list of recommended low fiberous foods in comparison to foods with high amounts of fiber, as well as a recommended sample day. Teach back method used.  Expect good compliance.  Body mass index is 30.35 kg/(m^2).   Current diet order is FL, patient diet order just advanced and tolerated tomato soup and ice cream per family. Labs and medications reviewed. RD contact information provided. If additional nutrition issues arise, please re-consult RD.  Dwyane Luo, RD, LDN Pager [phone] Weekend/On-Call Pager [phone]

## 2015-05-20 NOTE — Consult Note (Signed)
  Pt with no complaints. No further GI bleeding. Hgb stable. Ok to advance diet to full liquids. Agree with starting either bASA or ECASA by Monday as long as there is no signs of GI bleeding again. Will check over the weekend. Thanks.

## 2015-05-20 NOTE — Progress Notes (Signed)
[place] for electrolyte management  Indication: hypokalemia   Allergies  Allergen Reactions  . Latex Rash    Patient Measurements: Height: 6\' 1"  (185.4 cm) Weight: 230 lb (104.327 kg) IBW/kg (Calculated) : 79.9   Intake/Output from previous day: 03/16 0701 - 03/17 0700 In: 1816.3 [P.O.:1150; I.V.:666.3] Out: 1675 [Urine:1675] Intake/Output from this shift:    Labs:  Recent Labs  05/18/15 0514  05/19/15 0434 05/19/15 1724 05/19/15 2128 05/20/15 0423 05/20/15 1835  WBC 7.5  --  6.5  --   --  6.1  --   HGB 8.4*  < > 8.3* 8.5* 8.1* 8.3*  --   HCT 24.9*  < > 24.7* 25.5* 24.1* 24.8*  --   PLT 188  --  174  --   --  150  --   CREATININE 0.94  --  0.90  --   --  0.96  --   MG  --   --   --   --   --  1.5* 2.2  PHOS  --   --   --   --   --   --  1.2*  ALBUMIN  --   --   --   --   --   --  2.4*  < > = values in this interval not displayed. Estimated Creatinine Clearance: 74 mL/min (by C-G formula based on Cr of 0.96).   Assessment: Pharmacy consulted for electrolyte management for 82 yo male admitted with GI bleed. Patient ordered potassium 60mEq PO daily.   Plan:  Patient received potassium 33mEq PO Q4hr x 2 doses and magnesium 4g IV x 1.   K=4.2 Mg=2.2 phos=1.2 corrected ca=7.28  Will give sodium phosphate 97mmol IV once and calcium gluconate IV 2g once. Will also check an ionized calcium level.  Recheck all electrolytes in the AM.  Pharmacy will continue to monitor and adjust per consult.   Ramond Dial 05/20/2015,7:26 PM

## 2015-05-20 NOTE — Progress Notes (Addendum)
[place] for electrolyte management  Indication: hypokalemia   Allergies  Allergen Reactions  . Latex Rash    Patient Measurements: Height: 6\' 1"  (185.4 cm) Weight: 230 lb (104.327 kg) IBW/kg (Calculated) : 79.9   Intake/Output from previous day: 03/16 0701 - 03/17 0700 In: 1816.3 [P.O.:1150; I.V.:666.3] Out: 1675 [Urine:1675] Intake/Output from this shift: Total I/O In: 840 [P.O.:840] Out: 900 [Urine:900]  Labs:  Recent Labs  05/18/15 0514  05/19/15 0434 05/19/15 1724 05/19/15 2128 05/20/15 0423  WBC 7.5  --  6.5  --   --  6.1  HGB 8.4*  < > 8.3* 8.5* 8.1* 8.3*  HCT 24.9*  < > 24.7* 25.5* 24.1* 24.8*  PLT 188  --  174  --   --  150  CREATININE 0.94  --  0.90  --   --  0.96  MG  --   --   --   --   --  1.5*  < > = values in this interval not displayed. Estimated Creatinine Clearance: 74 mL/min (by C-G formula based on Cr of 0.96).   Assessment: Pharmacy consulted for electrolyte management for 82 yo male admitted with GI bleed. Patient ordered potassium 65mEq PO daily.   Plan:  Patient received potassium 19mEq PO Q4hr x 2 doses and magnesium 4g IV x 1.   Will obtain potassium, magnesium, phosphorus, and albumin at 1800.  Will need to determine corrected calcium with albumin.   Pharmacy will continue to monitor and adjust per consult.   Simpson,Michael L 05/20/2015,6:46 PM

## 2015-05-21 ENCOUNTER — Inpatient Hospital Stay: Payer: Medicare Other

## 2015-05-21 LAB — CBC
HEMATOCRIT: 26.5 % — AB (ref 40.0–52.0)
Hemoglobin: 8.9 g/dL — ABNORMAL LOW (ref 13.0–18.0)
MCH: 35.5 pg — AB (ref 26.0–34.0)
MCHC: 33.5 g/dL (ref 32.0–36.0)
MCV: 106 fL — AB (ref 80.0–100.0)
PLATELETS: 139 10*3/uL — AB (ref 150–440)
RBC: 2.5 MIL/uL — ABNORMAL LOW (ref 4.40–5.90)
RDW: 17.8 % — AB (ref 11.5–14.5)
WBC: 6.7 10*3/uL (ref 3.8–10.6)

## 2015-05-21 LAB — BASIC METABOLIC PANEL
Anion gap: 3 — ABNORMAL LOW (ref 5–15)
BUN: 6 mg/dL (ref 6–20)
CALCIUM: 6.6 mg/dL — AB (ref 8.9–10.3)
CO2: 28 mmol/L (ref 22–32)
CREATININE: 0.96 mg/dL (ref 0.61–1.24)
Chloride: 107 mmol/L (ref 101–111)
GFR calc Af Amer: >60 mL/min (ref >60)
GLUCOSE: 98 mg/dL (ref 65–99)
Potassium: 3.3 mmol/L — ABNORMAL LOW (ref 3.5–5.1)
Sodium: 138 mmol/L (ref 135–145)

## 2015-05-21 LAB — MAGNESIUM: Magnesium: 1.9 mg/dL (ref 1.7–2.4)

## 2015-05-21 LAB — PHOSPHORUS: Phosphorus: 2.2 mg/dL — ABNORMAL LOW (ref 2.5–4.6)

## 2015-05-21 MED ORDER — POTASSIUM CHLORIDE 20 MEQ PO PACK
20.0000 meq | PACK | Freq: Once | ORAL | Status: AC
  Administered 2015-05-21: 20 meq via ORAL
  Filled 2015-05-21: qty 1

## 2015-05-21 MED ORDER — POTASSIUM CHLORIDE 20 MEQ PO PACK
40.0000 meq | PACK | Freq: Once | ORAL | Status: AC
  Administered 2015-05-21: 40 meq via ORAL

## 2015-05-21 MED ORDER — K PHOS MONO-SOD PHOS DI & MONO 155-852-130 MG PO TABS
500.0000 mg | ORAL_TABLET | Freq: Four times a day (QID) | ORAL | Status: AC
  Administered 2015-05-21 (×4): 500 mg via ORAL
  Filled 2015-05-21 (×4): qty 2

## 2015-05-21 NOTE — Consult Note (Signed)
GI Inpatient Follow-up Note  Patient Identification: Chad Kirby is a 82 y.o. male with large duodenal ulcer.  Subjective: Tolerating full liquids without recurrent GI bleeding. Had a normal BM last night.  Scheduled Inpatient Medications:  . amiodarone  200 mg Oral Daily  . amoxicillin-clavulanate  1 tablet Oral TID  . antiseptic oral rinse  7 mL Mouth Rinse BID  . [START ON 05/23/2015] aspirin EC  81 mg Oral Daily  . atorvastatin  80 mg Oral QPM  . collagenase  1 application Topical BID  . diltiazem  360 mg Oral BH-q7a  . furosemide  20 mg Oral BID  . gabapentin  300 mg Oral BID  . metoprolol succinate  100 mg Oral BH-q7a  . mometasone-formoterol  2 puff Inhalation BID  . pantoprazole (PROTONIX) IV  40 mg Intravenous Q12H  . phosphorus  500 mg Oral QID  . potassium chloride  20 mEq Oral Once  . potassium chloride SA  40 mEq Oral Daily  . predniSONE  10 mg Oral Daily  . sodium chloride flush  3 mL Intravenous Q12H  . tiotropium  18 mcg Inhalation Daily    Continuous Inpatient Infusions:   . sodium chloride 50 mL/hr at 05/20/15 1126    PRN Inpatient Medications:  acetaminophen **OR** acetaminophen, albuterol, guaiFENesin-dextromethorphan, LORazepam, morphine CONCENTRATE, ondansetron **OR** ondansetron (ZOFRAN) IV, oxyCODONE, sodium chloride, traZODone  Review of Systems: Constitutional: Weight is stable.  Eyes: No changes in vision. ENT: No oral lesions, sore throat.  GI: see HPI.  Heme/Lymph: No easy bruising.  CV: No chest pain.  GU: No hematuria.  Integumentary: No rashes.  Neuro: No headaches.  Psych: No depression/anxiety.  Endocrine: No heat/cold intolerance.  Allergic/Immunologic: No urticaria.  Resp: No cough, SOB.  Musculoskeletal: No joint swelling.    Physical Examination: BP 102/64 mmHg  Pulse 118  Temp(Src) 97.7 F (36.5 C) (Oral)  Resp 17  Ht 6\' 1"  (1.854 m)  Wt 106.459 kg (234 lb 11.2 oz)  BMI 30.97 kg/m2  SpO2 96% Gen: NAD, alert and  oriented x 4 HEENT: PEERLA, EOMI, Neck: supple, no JVD or thyromegaly Chest: CTA bilaterally, no wheezes, crackles, or other adventitious sounds CV: RRR, no m/g/c/r Abd: soft, NT, ND, +BS in all four quadrants; no HSM, guarding, ridigity, or rebound tenderness Ext: no edema, well perfused with 2+ pulses, Skin: no rash or lesions noted Lymph: no LAD  Data: Lab Results  Component Value Date   WBC 6.7 05/21/2015   HGB 8.9* 05/21/2015   HCT 26.5* 05/21/2015   MCV 106.0* 05/21/2015   PLT 139* 05/21/2015    Recent Labs Lab 05/19/15 2128 05/20/15 0423 05/21/15 0710  HGB 8.1* 8.3* 8.9*   Lab Results  Component Value Date   NA 138 05/21/2015   K 3.3* 05/21/2015   CL 107 05/21/2015   CO2 28 05/21/2015   BUN 6 05/21/2015   CREATININE 0.96 05/21/2015   Lab Results  Component Value Date   ALT 19 05/15/2015   AST 26 05/15/2015   ALKPHOS 139* 05/15/2015   BILITOT 1.1 05/15/2015    Recent Labs Lab 05/18/15 0514  INR 1.06   Assessment/Plan: [name] is a 82 y.o. male with UGI bleeding. Stopped.  Recommendations: Advance to soft diet. Stay on soft diet for next few weeks. To start bASA on Monday. If pt can tolerate solids without bleeding, then pt can be discharged to home on protonix bid with f/u in GI office in few weeks. Thanks. Please call  with questions or concerns.  Ruchi Stoney, Lupita Dawn, MD

## 2015-05-21 NOTE — Progress Notes (Signed)
[facility] [place] NAME: Chad Kirby    MR#:  [id number]  DATE OF BIRTH:  1930/02/29  SUBJECTIVE:  CHIEF COMPLAINT:  Patient is denies any abd pain reporting chills and rigors but afebrile  . Hemoglobin is stable at 8.9. Tolerating soft diet  Patient had a colonoscopy 05/19/15. EGD done 3/16  REVIEW OF SYSTEMS:  CONSTITUTIONAL: No fever, fatigue or weakness.  EYES: No blurred or double vision.  EARS, NOSE, AND THROAT: No tinnitus or ear pain.  RESPIRATORY: No cough, shortness of breath, wheezing or hemoptysis.  CARDIOVASCULAR: No chest pain, orthopnea, edema.  GASTROINTESTINAL: No nausea, vomiting, diarrhea or abdominal pain.  GENITOURINARY: No dysuria, hematuria.  ENDOCRINE: No polyuria, nocturia,  HEMATOLOGY: No anemia, easy bruising or bleeding SKIN: No rash or lesion. MUSCULOSKELETAL: No joint pain or arthritis.   NEUROLOGIC: No tingling, numbness, weakness.  PSYCHIATRY: No anxiety or depression.   DRUG ALLERGIES:   Allergies  Allergen Reactions  . Latex Rash    VITALS:  Blood pressure 102/64, pulse 118, temperature 97.7 F (36.5 C), temperature source Oral, resp. rate 17, height 6\' 1"  (1.854 m), weight 106.459 kg (234 lb 11.2 oz), SpO2 96 %.  PHYSICAL EXAMINATION:  GENERAL:  82 y.o.-year-old patient lying in the bed with no acute distress.  EYES: Pupils equal, round, reactive to light and accommodation. No scleral icterus. Extraocular muscles intact.  HEENT: Head atraumatic, normocephalic. Oropharynx and nasopharynx clear.  NECK:  Supple, no jugular venous distention. No thyroid enlargement, no tenderness.  LUNGS: Normal breath sounds bilaterally, no wheezing, rales,rhonchi or crepitation. No use of accessory muscles of respiration.  CARDIOVASCULAR: Irregularly irregular. No murmurs, rubs, or gallops.  ABDOMEN: Soft, nontender, distended. Tympanic bowel sounds present. No organomegaly or mass.  EXTREMITIES: No  pedal edema, cyanosis, or clubbing.  NEUROLOGIC: Cranial nerves II through XII are intact. Muscle strength 5/5 in all extremities. Sensation intact. Gait not checked.  PSYCHIATRIC: The patient is alert and oriented x 3.  SKIN: No obvious rash, lesion, or ulcer.    LABORATORY PANEL:   CBC  Recent Labs Lab 05/21/15 0710  WBC 6.7  HGB 8.9*  HCT 26.5*  PLT 139*   ------------------------------------------------------------------------------------------------------------------  Chemistries   Recent Labs Lab 05/15/15 1810  05/21/15 0710  NA 138  < > 138  K 4.5  < > 3.3*  CL 102  < > 107  CO2 29  < > 28  GLUCOSE 103*  < > 98  BUN 38*  < > 6  CREATININE 1.49*  < > 0.96  CALCIUM 8.2*  < > 6.6*  MG  --   < > 1.9  AST 26  --   --   ALT 19  --   --   ALKPHOS 139*  --   --   BILITOT 1.1  --   --   < > = values in this interval not displayed. ------------------------------------------------------------------------------------------------------------------  Cardiac Enzymes No results for input(s): TROPONINI in the last 168 hours. ------------------------------------------------------------------------------------------------------------------  RADIOLOGY:  No results found.  EKG:   Orders placed or performed during the hospital encounter of 05/01/15  . ED EKG  . ED EKG  . EKG 12-Lead  . EKG 12-Lead    ASSESSMENT AND PLAN:   *  GI bleed -- prob from duodenal ulcer / colon polyps  Vs Coumadin coagulopathy and acute blood loss anemia Patient had a colonoscopy 3/15  and  EGD  05/19/2015  with large duodenal ulcer  s/p cauterization ,is status post 3 colon polyp removal.  Advanced patient to soft diet and tolerating well  cardiology Dr. Clayborn Bigness who is agreeable to discontinue Coumadin at this time. He is recommending to resume Coumadin at gastroenterology discretion, in view of bleeding duodenal ulcer  Coumadin discontinued  Gastroenterology is recommending to start the  patient on 81 mg of enteric-coated aspirin starting from Monday  Presently hemoglobin 8.9-8.1.--8.3- 8.4 -8.3-8.9 Check CBC in a.m.  Follow-up stool for H. pylori antigen Dietary consult is placed    * Atrial fibrillation- ch Continue rate control medications.  Discontinue  Coumadin.  cardiology is agreeable   start baby aspirin enteric-coated starting from Monday as recommended by gastroenterology  *Hypocalcemia   corrected calcium is 7.9. We'll start the patient on calcium supplements  *Chronic history of constipation with recent ileus get abdominal series  *Bilateral lower extremity edema-Lasix dose is increased to 20 minutes by mouth twice a day Encouraged patient to keep his legs elevated and by 3 pillows  * CKD stage III  * Prostate cancer with bone metastases. Follow-up with oncology as outpatient.  * COPD is stable. Continue inhalers. Nebulizers when necessary.  * Chronic lower extremity pressure ulcers. Continue collagenase that is being applied as outpatient. Continue Augmentin. Wound care consult.  *Deconditioning with physical therapy-placement at skilled nursing facility , and waiting for insurance approval -most likely bed is available for insurance approval on Monday   * DVT prophylaxis. Present the INR 4.5--1.7.--1.11  No heparin or Lovenox once reversed due to GI bleed SCDs      All the records are reviewed and case discussed with Care Management/Social Workerr. Management plans discussed with the patient, wife and son  at bedside  and they are in agreement.  CODE STATUS:  DO NOT RESUSCITATE TOTAL TIME TAKING CARE OF THIS PATIENT: 35 minutes.   POSSIBLE D/C IN 3-4 DAYS, DEPENDING ON CLINICAL CONDITION.   [name] M.D on 05/21/2015 at 1:35 PM  Between 7am to 6pm - Pager - [phone] After 6pm go to [email or web address] - password [email or web address] [facility]  [facility]  Office  [phone]  CC: Primary care physician; No primary care provider  on file.

## 2015-05-21 NOTE — Progress Notes (Signed)
[facility] for electrolyte management  Indication: hypokalemia   Allergies  Allergen Reactions  . Latex Rash    Patient Measurements: Height: 6\' 1"  (185.4 cm) Weight: 234 lb 11.2 oz (106.459 kg) IBW/kg (Calculated) : 79.9   Intake/Output from previous day: 03/17 0701 - 03/18 0700 In: 3657 [P.O.:1800; I.V.:1487; IV Piggyback:370] Out: 2950 [Urine:2950] Intake/Output from this shift: Total I/O In: 391 [I.V.:391] Out: 400 [Urine:400]  Labs:  Recent Labs  05/19/15 0434  05/19/15 2128 05/20/15 0423 05/20/15 1835 05/21/15 0710  WBC 6.5  --   --  6.1  --  6.7  HGB 8.3*  < > 8.1* 8.3*  --  8.9*  HCT 24.7*  < > 24.1* 24.8*  --  26.5*  PLT 174  --   --  150  --  139*  CREATININE 0.90  --   --  0.96  --  0.96  MG  --   --   --  1.5* 2.2 1.9  PHOS  --   --   --   --  1.2* 2.2*  ALBUMIN  --   --   --   --  2.4*  --   < > = values in this interval not displayed. Estimated Creatinine Clearance: 74.6 mL/min (by C-G formula based on Cr of 0.96).   Assessment: Pharmacy consulted for electrolyte management for 82 yo male admitted with GI bleed. Patient ordered potassium 69mEq PO daily.   3/18  K=3.3, Mag=1.9, Phos=2.2.  Plan:    Ordered K-Phos Neutral 2 tablets x 4 doses, and an additional dose of KCl 18meq packet for today.   Will recheck electrolytes with AM labs.   Pharmacy will continue to monitor and adjust per consult.   Olivia Canter, RPh Clinical Pharmacist  05/21/2015,9:14 AM

## 2015-05-22 ENCOUNTER — Inpatient Hospital Stay
Admit: 2015-05-22 | Discharge: 2015-05-22 | Disposition: A | Discharge status: Home / Self Care | Payer: Medicare Other | Attending: Internal Medicine | Admitting: Internal Medicine

## 2015-05-22 LAB — CBC
HCT: 24.2 % — ABNORMAL LOW (ref 40.0–52.0)
HEMOGLOBIN: 8.1 g/dL — AB (ref 13.0–18.0)
MCH: 35.4 pg — AB (ref 26.0–34.0)
MCHC: 33.3 g/dL (ref 32.0–36.0)
MCV: 106.3 fL — AB (ref 80.0–100.0)
Platelets: 121 10*3/uL — ABNORMAL LOW (ref 150–440)
RBC: 2.28 MIL/uL — AB (ref 4.40–5.90)
RDW: 18.2 % — ABNORMAL HIGH (ref 11.5–14.5)
WBC: 8.1 10*3/uL (ref 3.8–10.6)

## 2015-05-22 LAB — CALCIUM, IONIZED: Calcium, Ionized, Serum: 4 mg/dL — ABNORMAL LOW (ref 4.5–5.6)

## 2015-05-22 LAB — PHOSPHORUS: Phosphorus: 3.1 mg/dL (ref 2.5–4.6)

## 2015-05-22 LAB — MAGNESIUM: Magnesium: 1.8 mg/dL (ref 1.7–2.4)

## 2015-05-22 LAB — POTASSIUM
POTASSIUM: 3.4 mmol/L — AB (ref 3.5–5.1)
POTASSIUM: 3.3 mmol/L — AB (ref 3.5–5.1)

## 2015-05-22 MED ORDER — POTASSIUM CHLORIDE CRYS ER 20 MEQ PO TBCR
20.0000 meq | EXTENDED_RELEASE_TABLET | Freq: Once | ORAL | Status: AC
  Administered 2015-05-22: 20 meq via ORAL
  Filled 2015-05-22: qty 1

## 2015-05-22 MED ORDER — POTASSIUM CHLORIDE 10 MEQ/100ML IV SOLN
10.0000 meq | INTRAVENOUS | Status: AC
  Administered 2015-05-22 (×4): 10 meq via INTRAVENOUS
  Filled 2015-05-22 (×4): qty 100

## 2015-05-22 NOTE — Progress Notes (Signed)
[facility] for electrolyte management  Indication: hypokalemia   Allergies  Allergen Reactions  . Latex Rash    Patient Measurements: Height: 6\' 1"  (185.4 cm) Weight: 229 lb 12.8 oz (104.237 kg) IBW/kg (Calculated) : 79.9   Intake/Output from previous day: 03/18 0701 - 03/19 0700 In: 1287 [P.O.:300; I.V.:987] Out: 1250 [Urine:1250] Intake/Output from this shift:    Labs:  Recent Labs  05/20/15 0423 05/20/15 1835 05/21/15 0710 05/22/15 0608  WBC 6.1  --  6.7 8.1  HGB 8.3*  --  8.9* 8.1*  HCT 24.8*  --  26.5* 24.2*  PLT 150  --  139* 121*  CREATININE 0.96  --  0.96  --   MG 1.5* 2.2 1.9 1.8  PHOS  --  1.2* 2.2* 3.1  ALBUMIN  --  2.4*  --   --    Estimated Creatinine Clearance: 73.9 mL/min (by C-G formula based on Cr of 0.96).   Assessment: Pharmacy consulted for electrolyte management for 82 yo male admitted with GI bleed. Patient ordered potassium 45mEq PO daily.   3/18  K=3.3, Mag=1.9, Phos=2.2.  Plan:    Ordered K-Phos Neutral 2 tablets x 4 doses, and an additional dose of KCl 58meq packet for today.   Will recheck electrolytes with AM labs.   Pharmacy will continue to monitor and adjust per consult.   3/19 @ 6:08 :      K = 3.4                        Phos = 3.1                         Mag = 1.8   Will order KCl 10 mEq IV X 4 and recheck K on 3/19 @ 13:00.   [place] Clinical Pharmacist  05/22/2015,7:14 AM

## 2015-05-22 NOTE — Care Management Important Message (Signed)
Important Message  Patient Details  Name: Chad Kirby MRN: [id number] Date of Birth: October 11, 1929   Medicare Important Message Given:  Yes    Shakila Mak A, RN 05/22/2015, 2:25 PM

## 2015-05-22 NOTE — Consult Note (Signed)
  GI Inpatient Follow-up Note  Patient Identification: Chad Kirby is a 82 y.o. male with large duodenal ulcer. Subjective: Has tolerated solids. No signs of rebleeding. Scheduled Inpatient Medications:  . amiodarone  200 mg Oral Daily  . amoxicillin-clavulanate  1 tablet Oral TID  . antiseptic oral rinse  7 mL Mouth Rinse BID  . [START ON 05/23/2015] aspirin EC  81 mg Oral Daily  . atorvastatin  80 mg Oral QPM  . collagenase  1 application Topical BID  . diltiazem  360 mg Oral BH-q7a  . furosemide  20 mg Oral BID  . gabapentin  300 mg Oral BID  . metoprolol succinate  100 mg Oral BH-q7a  . mometasone-formoterol  2 puff Inhalation BID  . pantoprazole (PROTONIX) IV  40 mg Intravenous Q12H  . potassium chloride  10 mEq Intravenous Q1 Hr x 4  . potassium chloride SA  40 mEq Oral Daily  . predniSONE  10 mg Oral Daily  . sodium chloride flush  3 mL Intravenous Q12H  . tiotropium  18 mcg Inhalation Daily    Continuous Inpatient Infusions:     PRN Inpatient Medications:  acetaminophen **OR** acetaminophen, albuterol, guaiFENesin-dextromethorphan, LORazepam, morphine CONCENTRATE, ondansetron **OR** ondansetron (ZOFRAN) IV, oxyCODONE, sodium chloride, traZODone  Review of Systems: Constitutional: Weight is stable.  Eyes: No changes in vision. ENT: No oral lesions, sore throat.  GI: see HPI.  Heme/Lymph: No easy bruising.  CV: No chest pain.  GU: No hematuria.  Integumentary: No rashes.  Neuro: No headaches.  Psych: No depression/anxiety.  Endocrine: No heat/cold intolerance.  Allergic/Immunologic: No urticaria.  Resp: No cough, SOB.  Musculoskeletal: No joint swelling.    Physical Examination: BP 91/52 mmHg  Pulse 98  Temp(Src) 97.4 F (36.3 C) (Oral)  Resp 20  Ht 6\' 1"  (1.854 m)  Wt 104.237 kg (229 lb 12.8 oz)  BMI 30.33 kg/m2  SpO2 94% Gen: NAD, alert and oriented x 4 HEENT: PEERLA, EOMI, Neck: supple, no JVD or thyromegaly Chest: CTA bilaterally, no wheezes,  crackles, or other adventitious sounds CV: RRR, no m/g/c/r Abd: soft, NT, ND, +BS in all four quadrants; no HSM, guarding, ridigity, or rebound tenderness Ext: no edema, well perfused with 2+ pulses, Skin: no rash or lesions noted Lymph: no LAD  Data: Lab Results  Component Value Date   WBC 8.1 05/22/2015   HGB 8.1* 05/22/2015   HCT 24.2* 05/22/2015   MCV 106.3* 05/22/2015   PLT 121* 05/22/2015    Recent Labs Lab 05/20/15 0423 05/21/15 0710 05/22/15 0608  HGB 8.3* 8.9* 8.1*   Lab Results  Component Value Date   NA 138 05/21/2015   K 3.4* 05/22/2015   CL 107 05/21/2015   CO2 28 05/21/2015   BUN 6 05/21/2015   CREATININE 0.96 05/21/2015   Lab Results  Component Value Date   ALT 19 05/15/2015   AST 26 05/15/2015   ALKPHOS 139* 05/15/2015   BILITOT 1.1 05/15/2015    Recent Labs Lab 05/18/15 0514  INR 1.06   Assessment/Plan: Mr. Kleinke is a 82 y.o. male with duodenal ulcer. No signs of rebleeding.  Recommendations: If there is no sudden drop in hgb, then pt stable for discharge back to rehab tomorrow. Ok to start bASA tomorrow. Have pt f/u with usin 1-2 weeks. Will sign off. Thanks. Please call with questions or concerns.  Nakiya Rallis, Lupita Dawn, MD

## 2015-05-22 NOTE — Progress Notes (Signed)
[facility] for electrolyte management  Indication: hypokalemia   Allergies  Allergen Reactions  . Latex Rash    Patient Measurements: Height: 6\' 1"  (185.4 cm) Weight: 229 lb 12.8 oz (104.237 kg) IBW/kg (Calculated) : 79.9   Intake/Output from previous day: 03/18 0701 - 03/19 0700 In: 1287 [P.O.:300; I.V.:987] Out: 1250 [Urine:1250] Intake/Output from this shift: Total I/O In: 568 [I.V.:169; IV Piggyback:399] Out: 0   Labs:  Recent Labs  05/20/15 0423 05/20/15 1835 05/21/15 0710 05/22/15 0608  WBC 6.1  --  6.7 8.1  HGB 8.3*  --  8.9* 8.1*  HCT 24.8*  --  26.5* 24.2*  PLT 150  --  139* 121*  CREATININE 0.96  --  0.96  --   MG 1.5* 2.2 1.9 1.8  PHOS  --  1.2* 2.2* 3.1  ALBUMIN  --  2.4*  --   --    Estimated Creatinine Clearance: 73.9 mL/min (by C-G formula based on Cr of 0.96).   Assessment: Pharmacy consulted for electrolyte management for 82 yo male admitted with GI bleed. Patient ordered potassium 47mEq PO daily.   Plan:   3/19 @ 6:08  K = 3.4, Phos = 3.1, Mag = 1.8   Will order KCl 10 mEq IV X 4 and recheck K on 3/19 @ 13:00.   3/19 @ 13:00  K = 3.3.  Ordered an additional KCl 82meq Po x 1 dose for this afternoon.  Will check electrolytes with am labs.       Pharmacy will continue to monitor and adjust per consult.   [place] Clinical Pharmacist  05/22/2015,2:23 PM

## 2015-05-22 NOTE — Progress Notes (Addendum)
[facility] [place] NAME: Regniald Yeiser    MR#:  [id number]  DATE OF BIRTH:  1929/12/21  SUBJECTIVE:  CHIEF COMPLAINT:  Patient is tolerating soft diet . Denies any shortness of breath. Out of bed to chair  REVIEW OF SYSTEMS:  CONSTITUTIONAL: No fever, fatigue or weakness.  EYES: No blurred or double vision.  EARS, NOSE, AND THROAT: No tinnitus or ear pain.  RESPIRATORY: No cough, shortness of breath, wheezing or hemoptysis.  CARDIOVASCULAR: No chest pain, orthopnea, edema.  GASTROINTESTINAL: No nausea, vomiting, diarrhea or abdominal pain.  GENITOURINARY: No dysuria, hematuria.  ENDOCRINE: No polyuria, nocturia,  HEMATOLOGY: No anemia, easy bruising or bleeding SKIN: No rash or lesion. MUSCULOSKELETAL: No joint pain or arthritis.   NEUROLOGIC: No tingling, numbness, weakness.  PSYCHIATRY: No anxiety or depression.   DRUG ALLERGIES:   Allergies  Allergen Reactions  . Latex Rash    VITALS:  Blood pressure 101/62, pulse 91, temperature 97.7 F (36.5 C), temperature source Oral, resp. rate 20, height 6\' 1"  (1.854 m), weight 104.237 kg (229 lb 12.8 oz), SpO2 95 %.  PHYSICAL EXAMINATION:  GENERAL:  82 y.o.-year-old patient lying in the bed with no acute distress.  EYES: Pupils equal, round, reactive to light and accommodation. No scleral icterus. Extraocular muscles intact.  HEENT: Head atraumatic, normocephalic. Oropharynx and nasopharynx clear.  NECK:  Supple, no jugular venous distention. No thyroid enlargement, no tenderness.  LUNGS: Diminished breath sounds bilaterally at bases, no wheezing, rales,rhonchi or crepitation. No use of accessory muscles of respiration.  CARDIOVASCULAR: Irregularly irregular. No murmurs, rubs, or gallops.  ABDOMEN: Soft, nontender, distended. Tympanic bowel sounds present. No organomegaly or mass.  EXTREMITIES: Has  + Pittingpedal edema, cyanosis, or clubbing.  NEUROLOGIC: Cranial nerves II  through XII are intact. Muscle strength 5/5 in all extremities. Sensation intact. Gait not checked.  PSYCHIATRIC: The patient is alert and oriented x 3.  SKIN: No obvious rash, lesion, or ulcer.    LABORATORY PANEL:   CBC  Recent Labs Lab 05/22/15 0608  WBC 8.1  HGB 8.1*  HCT 24.2*  PLT 121*   ------------------------------------------------------------------------------------------------------------------  Chemistries   Recent Labs Lab 05/15/15 1810  05/21/15 0710 05/22/15 0608 05/22/15 1300  NA 138  < > 138  --   --   K 4.5  < > 3.3* 3.4* 3.3*  CL 102  < > 107  --   --   CO2 29  < > 28  --   --   GLUCOSE 103*  < > 98  --   --   BUN 38*  < > 6  --   --   CREATININE 1.49*  < > 0.96  --   --   CALCIUM 8.2*  < > 6.6*  --   --   MG  --   < > 1.9 1.8  --   AST 26  --   --   --   --   ALT 19  --   --   --   --   ALKPHOS 139*  --   --   --   --   BILITOT 1.1  --   --   --   --   < > = values in this interval not displayed. ------------------------------------------------------------------------------------------------------------------  Cardiac Enzymes No results for input(s): TROPONINI in the last 168 hours. ------------------------------------------------------------------------------------------------------------------  RADIOLOGY:  Dg Chest 2 View  05/21/2015  CLINICAL DATA:  Chills.  Shortness  of breath. EXAM: CHEST  2 VIEW COMPARISON:  May 16, 2015 FINDINGS: No pneumothorax. Bilateral pleural effusions with underlying atelectasis are again seen. Stable cardiomediastinal silhouette. Mild edema. Sclerotic vertebral bodies are again noted. IMPRESSION: Bilateral pleural effusions with underlying atelectasis and mild edema. Stable sclerotic vertebral bodies. Electronically Signed   By: Dorise Bullion III M.D   On: 05/21/2015 15:09    EKG:   Orders placed or performed during the hospital encounter of 05/01/15  . ED EKG  . ED EKG  . EKG 12-Lead  . EKG 12-Lead     ASSESSMENT AND PLAN:   *  GI bleed -- prob from duodenal ulcer / colon polyps  Vs Coumadin coagulopathy and acute blood loss anemia Patient had a colonoscopy 3/15  and  EGD  05/19/2015  with large duodenal ulcer s/p cauterization ,is status post 3 colon polyp removal.  Advanced patient to soft diet and tolerating well  cardiology Dr. Clayborn Bigness who is agreeable to discontinue Coumadin at this time. He is recommending to resume Coumadin at gastroenterology discretion, in view of bleeding duodenal ulcer  Coumadin discontinued  Gastroenterology is recommending to start the patient on 81 mg of enteric-coated aspirin starting from Monday , signed off , ok to d/c  Presently hemoglobin 8.9-8.1.--8.3- 8.4 -8.3-8.9--8.1 Check CBC in a.m.  Follow-up stool for H. pylori antigen Dietary recommendations     * Atrial fibrillation- ch Continue rate control medications.  Discontinue  Coumadin.  cardiology is agreeable   start baby aspirin enteric-coated starting from Monday as recommended by gastroenterology  *Hypocalcemia   corrected calcium is 7.9. We'll start the patient on calcium supplements  *Chronic history of constipation with recent ileus get abdominal series  * Bilateral lower extremity edema- Lasix dose is increased to 20 mg by mouth twice a day Encouraged patient to keep his legs elevated and by 3 pillows\ Echo ordered  cxr with pleural effusions minimal-blood pressure being soft, not considering IV Lasix at this time Consider [facility] cardiology consult if no improvement  * CKD stage III  * Prostate cancer with bone metastases. Follow-up with oncology as outpatient.  * COPD is stable. Continue inhalers. Nebulizers when necessary.  * Chronic lower extremity pressure ulcers. Continue collagenase that is being applied as outpatient. Continue Augmentin. Wound care consult.  *Deconditioning with physical therapy-placement at skilled nursing facility , and waiting for insurance  approval -most likely bed is available for insurance approval on Monday   * DVT prophylaxis. Present the INR 4.5--1.7.--1.11  No heparin or Lovenox once reversed due to GI bleed SCDs      All the records are reviewed and case discussed with Care Management/Social Workerr. Management plans discussed with the patient, wife and son  at bedside  and they are in agreement.  CODE STATUS:  DO NOT RESUSCITATE TOTAL TIME TAKING CARE OF THIS PATIENT: 35 minutes.   POSSIBLE D/C IN 3-4 DAYS, DEPENDING ON CLINICAL CONDITION.   [name] M.D on 05/22/2015 at 3:24 PM  Between 7am to 6pm - Pager - [phone] After 6pm go to [email or web address] - password [email or web address] [facility]  [place] [facility]  Office  [phone]  CC: Primary care physician; No primary care provider on file.

## 2015-05-22 NOTE — Progress Notes (Signed)
Notified Dr. Margaretmary Eddy that patients blood pressure meds were held this morning, due to low blood pressure.  She acknowledged.

## 2015-05-22 NOTE — Consult Note (Signed)
WOC wound consult note Reason for Consult: reconsulted for weeping arms Wound type: weeping Measurement: right elbow weeping but I am not able to determine any open wounds  Wound bed: weeping skin Drainage (amount, consistency, odor) serous Periwound: frail, fragile skin Dressing procedure/placement/frequency: Silicone foam dressings to absorb drainage and protect skin.  Change every 3 days and PRN saturation.   Discussed POC with patient and bedside nurse.  Re consult if needed, will not follow at this time. Thanks  Eevie Lapp [place], [place] [phone])

## 2015-05-22 NOTE — Progress Notes (Signed)
*  PRELIMINARY RESULTS* Echocardiogram 2D Echocardiogram has been performed.  Chad Kirby 05/22/2015, 12:09 PM

## 2015-05-23 LAB — MAGNESIUM: MAGNESIUM: 1.6 mg/dL — AB (ref 1.7–2.4)

## 2015-05-23 LAB — CBC
HCT: 24.2 % — ABNORMAL LOW (ref 40.0–52.0)
HEMOGLOBIN: 8 g/dL — AB (ref 13.0–18.0)
MCH: 34.8 pg — AB (ref 26.0–34.0)
MCHC: 33 g/dL (ref 32.0–36.0)
MCV: 105.4 fL — ABNORMAL HIGH (ref 80.0–100.0)
Platelets: 113 10*3/uL — ABNORMAL LOW (ref 150–440)
RBC: 2.29 MIL/uL — ABNORMAL LOW (ref 4.40–5.90)
RDW: 18.1 % — AB (ref 11.5–14.5)
WBC: 8.9 10*3/uL (ref 3.8–10.6)

## 2015-05-23 LAB — BASIC METABOLIC PANEL
Anion gap: 6 (ref 5–15)
BUN: 12 mg/dL (ref 6–20)
CHLORIDE: 105 mmol/L (ref 101–111)
CO2: 25 mmol/L (ref 22–32)
CREATININE: 0.97 mg/dL (ref 0.61–1.24)
Calcium: 6.2 mg/dL — CL (ref 8.9–10.3)
GFR calc Af Amer: >60 mL/min (ref >60)
GFR calc non Af Amer: >60 mL/min (ref >60)
Glucose, Bld: 115 mg/dL — ABNORMAL HIGH (ref 65–99)
Potassium: 4.3 mmol/L (ref 3.5–5.1)
SODIUM: 136 mmol/L (ref 135–145)

## 2015-05-23 LAB — ECHOCARDIOGRAM COMPLETE
HEIGHTINCHES: 73 in
WEIGHTICAEL: 3676.8 [oz_av]

## 2015-05-23 LAB — ALBUMIN: ALBUMIN: 2.1 g/dL — AB (ref 3.5–5.0)

## 2015-05-23 LAB — PHOSPHORUS: Phosphorus: 2.7 mg/dL (ref 2.5–4.6)

## 2015-05-23 MED ORDER — OXYCODONE HCL 5 MG PO TABS: 5.0000 mg | ORAL_TABLET | ORAL | Status: DC | PRN

## 2015-05-23 MED ORDER — FERROUS SULFATE 325 (65 FE) MG PO TABS
325.0000 mg | ORAL_TABLET | Freq: Two times a day (BID) | ORAL | Status: DC
  Administered 2015-05-23: 325 mg via ORAL
  Filled 2015-05-23: qty 1

## 2015-05-23 MED ORDER — POLYETHYLENE GLYCOL 3350 17 G PO PACK
17.0000 g | PACK | Freq: Once | ORAL | Status: AC
  Administered 2015-05-23: 17 g via ORAL
  Filled 2015-05-23: qty 1

## 2015-05-23 MED ORDER — SODIUM CHLORIDE 0.9 % IV SOLN
2.0000 g | Freq: Once | INTRAVENOUS | Status: DC
  Filled 2015-05-23: qty 20

## 2015-05-23 MED ORDER — ASPIRIN 81 MG PO TBEC: 81.0000 mg | DELAYED_RELEASE_TABLET | Freq: Every day | ORAL | Status: DC

## 2015-05-23 MED ORDER — MAGNESIUM SULFATE 2 GM/50ML IV SOLN
2.0000 g | Freq: Once | INTRAVENOUS | Status: AC
  Administered 2015-05-23: 2 g via INTRAVENOUS
  Filled 2015-05-23: qty 50

## 2015-05-23 MED ORDER — PANTOPRAZOLE SODIUM 40 MG PO TBEC: 40.0000 mg | DELAYED_RELEASE_TABLET | Freq: Two times a day (BID) | ORAL | Status: DC

## 2015-05-23 MED ORDER — ENSURE ENLIVE PO LIQD
237.0000 mL | Freq: Two times a day (BID) | ORAL | Status: DC
  Administered 2015-05-23 (×2): 237 mL via ORAL

## 2015-05-23 MED ORDER — ENSURE ENLIVE PO LIQD: 237.0000 mL | Freq: Two times a day (BID) | ORAL | Status: DC

## 2015-05-23 MED ORDER — PREMIER PROTEIN SHAKE
2.0000 [oz_av] | Freq: Four times a day (QID) | ORAL | Status: DC
  Administered 2015-05-23 (×2): 2 [oz_av] via ORAL
  Filled 2015-05-23: qty 325.31

## 2015-05-23 NOTE — Progress Notes (Signed)
[facility] for electrolyte management  Indication: hypokalemia    Labs:  Recent Labs  05/20/15 1835 05/21/15 0710 05/22/15 0608 05/23/15 0516  WBC  --  6.7 8.1 8.9  HGB  --  8.9* 8.1* 8.0*  HCT  --  26.5* 24.2* 24.2*  PLT  --  139* 121* 113*  CREATININE  --  0.96  --  0.97  MG 2.2 1.9 1.8 1.6*  PHOS 1.2* 2.2* 3.1 2.7  ALBUMIN 2.4*  --   --  2.1*   Estimated Creatinine Clearance: 73.1 mL/min (by C-G formula based on Cr of 0.97).   Assessment: Pharmacy consulted for electrolyte management for 82 yo male admitted with GI bleed. Patient ordered potassium 52mEq PO daily.   Plan:   3/20 @ 6:08  K =4.3, Phos = 2.7, Mag = 1.6  Ca: 6.1, Albumin 2.1, Corrected Ca: 7.72  Ordered Magnesium 2gm IV x 1, calcium gluconate 2gm IV x 1. Continue KCl 40 mEq PO daily. Will recheck electrolytes with AM labs.  Rexene Edison, PharmD Clinical Pharmacist   05/23/2015,2:33 PM

## 2015-05-23 NOTE — Progress Notes (Signed)
Physical Therapy Treatment Patient Details Name: JAXTYN KOBAYASHI MRN: [id number] DOB: 1929-11-06 Today's Date: 05/23/2015    History of Present Illness 82 yo M recently admitted to hospital at the end of February for ileus and cellulitis, discharging on 3/3 to STR at [facility]. Pt returned to ED on 3/12 from [facility] for  a rectal bleed. PMH includes COPD, a-fib, prostate cancer with bone metastasis.    PT Comments    Pt demonstrates gradual progress towards goals. He was able to ambulate 5 ft with FWW and minA +2. Transfers required modA +2 and FWW with cues for set up and safe technique. Pt becomes anxious and fearful of falling when standing due to feeling as though his knees are going to give away. Increased time needed to complete tasks due to fatigue and pt's anxiousness. SpO2 ranged 89 to 93% on 2 L and HR 111 to 128. He will benefit from continues skilled PT to increase strength, endurance, and functional mobility.  Follow Up Recommendations  SNF     Equipment Recommendations  None recommended by PT    Recommendations for Other Services       Precautions / Restrictions Precautions Precautions: Fall Restrictions Weight Bearing Restrictions: No    Mobility  Bed Mobility Overal bed mobility: Needs Assistance Bed Mobility: Supine to Sit     Supine to sit: Mod assist     General bed mobility comments: difficulty getting trunk upright  Transfers Overall transfer level: Needs assistance Equipment used: Rolling walker (2 wheeled) Transfers: Sit to/from [employer] Transfers Sit to Stand: Min assist;+2 physical assistance Stand pivot transfers: Min assist;+2 physical assistance       General transfer comment: cues for hand placement, postural correction and safe technique, anterior weight shift cues  Ambulation/Gait Ambulation/Gait assistance: Min assist;+2 physical assistance Ambulation Distance (Feet): 5 Feet Assistive device: Rolling walker (2  wheeled) Gait Pattern/deviations: Step-to pattern;Leaning posteriorly Gait velocity: reduced   General Gait Details: very unsteady; c/o feeling that his knees are going to give out; assistance for turning FWW and cues for safety   Stairs            Wheelchair Mobility    Modified Rankin (Stroke Patients Only)       Balance Overall balance assessment: Needs assistance Sitting-balance support: Feet supported Sitting balance-Leahy Scale: Fair     Standing balance support: Bilateral upper extremity supported Standing balance-Leahy Scale: Poor Standing balance comment: unsteady, standing tolerance limited                    Cognition Arousal/Alertness: Awake/alert Behavior During Therapy: WFL for tasks assessed/performed Overall Cognitive Status: Within Functional Limits for tasks assessed                      Exercises      General Comments General comments (skin integrity, edema, etc.): B LE edema and discoloration (staining)      Pertinent Vitals/Pain Pain Assessment: No/denies pain    Home Living                      Prior Function            PT Goals (current goals can now be found in the care plan section) Acute Rehab PT Goals Patient Stated Goal: to walk again PT Goal Formulation: With patient Time For Goal Achievement: 05/31/15 Potential to Achieve Goals: Fair Progress towards PT goals: Progressing toward goals  Frequency  Min 2X/week    PT Plan Current plan remains appropriate    Co-evaluation             End of Session Equipment Utilized During Treatment: Gait belt;Oxygen Activity Tolerance: Patient limited by fatigue Patient left: in chair;with call bell/phone within reach;with chair alarm set;with family/visitor present     Time: 1150-1208 PT Time Calculation (min) (ACUTE ONLY): 18 min  Charges:  $Therapeutic Activity: 8-22 mins                    G Codes:      Neoma Laming, PT, DPT   05/23/2015, 12:49 PM [phone]

## 2015-05-23 NOTE — Discharge Summary (Addendum)
[facility] [place] NAME: Chad Kirby    MR#:  [id number]  DATE OF BIRTH:  05/12/1929  DATE OF ADMISSION:  05/15/2015 ADMITTING PHYSICIAN: Hillary Bow, MD  DATE OF DISCHARGE: 05/23/2015  PRIMARY CARE PHYSICIAN: No primary care provider on file.    ADMISSION DIAGNOSIS:  Acute GI bleeding [K92.2] Bleeding on Coumadin (HCC) [R58, T45.515A]  DISCHARGE DIAGNOSIS:  Active Problems:   Lower GI bleed   Duodenal ulcer  SECONDARY DIAGNOSIS:   Past Medical History  Diagnosis Date  . COPD (chronic obstructive pulmonary disease) ([place])   . Cancer [facility])     Prostate with bone mets-current chemo treatment  . Hyperlipidemia   . Atrial fibrillation [facility])     HOSPITAL COURSE:   * GI bleed -- prob from duodenal ulcer / colon polyps Vs Coumadin coagulopathy and acute blood loss anemia Patient had a colonoscopy 3/15 and EGD 05/19/2015 with large duodenal ulcer s/p cauterization ,is status post 3 colon polyp removal.  Advanced patient to soft diet and tolerating well cardiology Dr. Clayborn Bigness who is agreeable to discontinue Coumadin at this time. He is recommending to resume Coumadin at gastroenterology discretion, in view of bleeding duodenal ulcer  Coumadin discontinued  Gastroenterology is recommending to start the patient on 81 mg of enteric-coated aspirin  Presently hemoglobin 8.9-8.1.--8.3- 8.4 -8.3-8.9--8.1  Follow-up stool for H. pylori antigen Added dietary supplementation. Follow up in GI clinic.   * Atrial fibrillation- ch Continue rate control medications.  Discontinue Coumadin. cardiology is agreeable  start baby aspirin enteric-coated as recommended by gastroenterology  *Hypocalcemia  corrected calcium is 7.9. start the patient on calcium supplements  Albumin was also low- added dietary supplement and protein shake.  *Chronic history of constipation with recent ileus Stable.  * Bilateral lower  extremity edema- Lasix dose is increased to 20 mg by mouth twice a day Encouraged patient to keep his legs elevated and by 3 pillows\ Echo ordered - reviewed cxr with pleural effusions minimal-blood pressure being soft, not considering IV Lasix at this time Follow with Dr. Saralyn Pilar in office in 2 weeks.  * CKD stage III  * Prostate cancer with bone metastases. Follow-up with oncology as outpatient.  * COPD is stable. Continue inhalers. Nebulizers when necessary.  * Chronic lower extremity pressure ulcers. Continue collagenase that is being applied as outpatient. Continue Augmentin. Wound care consult and recommendation appreciated, follow as out pt, if needed.  *Deconditioning with physical therapy-placement at skilled nursing facility , and waiting for insurance approval  * DVT prophylaxis. Present the INR 4.5--1.7.--1.11 No heparin or Lovenox once reversed due to GI bleed SCDs  DISCHARGE CONDITIONS:   Stable.  CONSULTS OBTAINED:  Treatment Team:  [name], MD Isaias Cowman, MD  DRUG ALLERGIES:   Allergies  Allergen Reactions  . Latex Rash    DISCHARGE MEDICATIONS:   Current Discharge Medication List    START taking these medications   Details  aspirin EC 81 MG EC tablet Take 1 tablet (81 mg total) by mouth daily. Qty: 30 tablet, Refills: 0    feeding supplement, ENSURE ENLIVE, (ENSURE ENLIVE) LIQD Take 237 mLs by mouth 2 (two) times daily between meals. Qty: 237 mL, Refills: 12    pantoprazole (PROTONIX) 40 MG tablet Take 1 tablet (40 mg total) by mouth 2 (two) times daily. Qty: 60 tablet, Refills: 0      CONTINUE these medications which have CHANGED   Details  oxyCODONE (OXY IR/ROXICODONE) 5 MG  immediate release tablet Take 1 tablet (5 mg total) by mouth every 4 (four) hours as needed for moderate pain. Qty: 30 tablet, Refills: 0      CONTINUE these medications which have NOT CHANGED   Details  acetaminophen (TYLENOL) 500 MG tablet Take 500  mg by mouth every 6 (six) hours as needed. For pain.    albuterol (PROVENTIL) (2.5 MG/3ML) 0.083% nebulizer solution Take 3 mLs (2.5 mg total) by nebulization every 4 (four) hours as needed for wheezing or shortness of breath. Qty: 75 mL, Refills: 12    amiodarone (PACERONE) 200 MG tablet Take 1 tablet (200 mg total) by mouth daily. Qty: 30 tablet, Refills: 0    amoxicillin-clavulanate (AUGMENTIN) 500-125 MG tablet Take 1 tablet (500 mg total) by mouth 3 (three) times daily. Qty: 12 tablet, Refills: 0    atorvastatin (LIPITOR) 80 MG tablet Take 80 mg by mouth every evening. Refills: 1    budesonide-formoterol (SYMBICORT) 80-4.5 MCG/ACT inhaler Inhale 2 puffs into the lungs 2 (two) times daily.    Calcium Carbonate-Vitamin D 600-400 MG-UNIT tablet Take 2 tablets by mouth 2 (two) times daily.    collagenase (SANTYL) ointment Apply 1 application topically 2 (two) times daily. Apply a thick layer to posterior left lower leg, cover with NS damp to dry gauze, wrap with kerlix    diltiazem (CARDIZEM CD) 180 MG 24 hr capsule Take 360 mg by mouth every morning. Refills: 1    furosemide (LASIX) 20 MG tablet Take 20 mg by mouth daily.    gabapentin (NEURONTIN) 300 MG capsule Take 1 capsule (300 mg total) by mouth 2 (two) times daily. Qty: 60 capsule, Refills: 0    LORazepam (ATIVAN) 0.5 MG tablet Take 0.25 mg by mouth every 4 (four) hours as needed for anxiety.    morphine (ROXANOL) 20 MG/ML concentrated solution Take 5-10 mg by mouth every hour as needed for severe pain.    polyethylene glycol (MIRALAX / GLYCOLAX) packet Take 17 g by mouth 2 (two) times daily. Qty: 14 each, Refills: 0    potassium chloride SA (K-DUR,KLOR-CON) 20 MEQ tablet Take 40 mEq by mouth daily.    predniSONE (DELTASONE) 10 MG tablet Take 10 mg by mouth daily. Refills: 9    prochlorperazine (COMPAZINE) 10 MG tablet Take 10 mg by mouth every 6 (six) hours as needed. For nausea and vomiting.    senna-docusate  (SENOKOT-S) 8.6-50 MG tablet Take 2 tablets by mouth 2 (two) times daily.    sodium chloride (OCEAN) 0.65 % SOLN nasal spray Place 1 spray into both nostrils as needed for congestion.    tiotropium (SPIRIVA) 18 MCG inhalation capsule Place 18 mcg into inhaler and inhale daily.    traZODone (DESYREL) 50 MG tablet Take 50 mg by mouth at bedtime as needed for sleep.    Leuprolide Acetate, 6 Month, (LUPRON) 45 MG injection Inject 45 mg into the muscle every 6 (six) months.      STOP taking these medications     metoprolol succinate (TOPROL-XL) 100 MG 24 hr tablet      warfarin (COUMADIN) 2 MG tablet          DISCHARGE INSTRUCTIONS:    Follow with GI clinic in 2 week.  If you experience worsening of your admission symptoms, develop shortness of breath, life threatening emergency, suicidal or homicidal thoughts you must seek medical attention immediately by calling 911 or calling your MD immediately  if symptoms less severe.  You Must read complete instructions/literature  along with all the possible adverse reactions/side effects for all the Medicines you take and that have been prescribed to you. Take any new Medicines after you have completely understood and accept all the possible adverse reactions/side effects.   Please note  You were cared for by a hospitalist during your hospital stay. If you have any questions about your discharge medications or the care you received while you were in the hospital after you are discharged, you can call the unit and asked to speak with the hospitalist on call if the hospitalist that took care of you is not available. Once you are discharged, your primary care physician will handle any further medical issues. Please note that NO REFILLS for any discharge medications will be authorized once you are discharged, as it is imperative that you return to your primary care physician (or establish a relationship with a primary care physician if you do not have  one) for your aftercare needs so that they can reassess your need for medications and monitor your lab values.    Today   CHIEF COMPLAINT:   Chief Complaint  Patient presents with  . Rectal Bleeding    HISTORY OF PRESENT ILLNESS:  Chad Kirby  is a 82 y.o. male with a known history of COPD, atrial fibrillation on Coumadin, prostate cancer with bone metastases and recent admission for ileus and lower extremity cellulitis returns to the emergency room due to large amount of bright red blood per rectum one episode. Patient left the hospital on 05/06/2015 and as per patient and family has not had a bowel movement since then. Today he had very little stool but large amount of bright red blood with some clots. Daughter took a picture with her mobile phone which is available and I looked at it. Patient does feel lightheaded and dizzy on standing up and extremely weak. Has not been eating well. Does not complain of any abdominal pain. Had some nausea. No vomiting. Was started on Lasix recently by Dr. Tomasita Crumble shows for congestive heart failure. Patient is on Coumadin and his INR is 4.5 today. Hemoglobin is 8.9 and was 13 months back.   VITAL SIGNS:  Blood pressure 95/64, pulse 112, temperature 97.4 F (36.3 C), temperature source Oral, resp. rate 17, height 6\' 1"  (1.854 m), weight 104.237 kg (229 lb 12.8 oz), SpO2 90 %.  I/O:    Intake/Output Summary (Last 24 hours) at 05/23/15 1015 Last data filed at 05/23/15 0936  Gross per 24 hour  Intake    879 ml  Output    750 ml  Net    129 ml    PHYSICAL EXAMINATION:   GENERAL: 82 y.o.-year-old patient lying in the bed with no acute distress.  EYES: Pupils equal, round, reactive to light and accommodation. No scleral icterus. Extraocular muscles intact.  HEENT: Head atraumatic, normocephalic. Oropharynx and nasopharynx clear.  NECK: Supple, no jugular venous distention. No thyroid enlargement, no tenderness.  LUNGS: Diminished breath  sounds bilaterally at bases, no wheezing, rales,rhonchi or crepitation. No use of accessory muscles of respiration.  CARDIOVASCULAR: Irregularly irregular. No murmurs, rubs, or gallops.  ABDOMEN: Soft, nontender, distended. Tympanic bowel sounds present. No organomegaly or mass.  EXTREMITIES: Has + Pittingpedal edema, cyanosis, or clubbing.  NEUROLOGIC: Cranial nerves II through XII are intact. Muscle strength 5/5 in all extremities. Sensation intact. Gait not checked.  PSYCHIATRIC: The patient is alert and oriented x 3.  SKIN: No obvious rash, lesion, or ulcer.    DATA REVIEW:  CBC  Recent Labs Lab 05/23/15 0516  WBC 8.9  HGB 8.0*  HCT 24.2*  PLT 113*    Chemistries   Recent Labs Lab 05/23/15 0516  NA 136  K 4.3  CL 105  CO2 25  GLUCOSE 115*  BUN 12  CREATININE 0.97  CALCIUM 6.2*  MG 1.6*    Cardiac Enzymes No results for input(s): TROPONINI in the last 168 hours.  Microbiology Results  Results for orders placed or performed during the hospital encounter of 05/15/15  MRSA PCR Screening     Status: None   Collection Time: 05/15/15  8:43 PM  Result Value Ref Range Status   MRSA by PCR NEGATIVE NEGATIVE Final    Comment:        The GeneXpert MRSA Assay (FDA approved for NASAL specimens only), is one component of a comprehensive MRSA colonization surveillance program. It is not intended to diagnose MRSA infection nor to guide or monitor treatment for MRSA infections.     RADIOLOGY:  Dg Chest 2 View  05/21/2015  CLINICAL DATA:  Chills.  Shortness of breath. EXAM: CHEST  2 VIEW COMPARISON:  May 16, 2015 FINDINGS: No pneumothorax. Bilateral pleural effusions with underlying atelectasis are again seen. Stable cardiomediastinal silhouette. Mild edema. Sclerotic vertebral bodies are again noted. IMPRESSION: Bilateral pleural effusions with underlying atelectasis and mild edema. Stable sclerotic vertebral bodies. Electronically Signed   By: Dorise Bullion III M.D   On: 05/21/2015 15:09     Management plans discussed with the patient, family and they are in agreement.  CODE STATUS:     Code Status Orders        Start     Ordered   05/15/15 1954  Do not attempt resuscitation (DNR)   Continuous    Question Answer Comment  In the event of cardiac or respiratory ARREST Do not call a "code blue"   In the event of cardiac or respiratory ARREST Do not perform Intubation, CPR, defibrillation or ACLS   In the event of cardiac or respiratory ARREST Use medication by any route, position, wound care, and other measures to relive pain and suffering. May use oxygen, suction and manual treatment of airway obstruction as needed for comfort.      05/15/15 1954    Code Status History    Date Active Date Inactive Code Status Order ID Comments User Context   05/01/2015  8:33 PM 05/06/2015  9:24 PM Full Code [id number]  Demetrios Loll, MD Inpatient    Advance Directive Documentation        Most Recent Value   Type of Advance Directive  Out of facility DNR (pink MOST or yellow form)   Pre-existing out of facility DNR order (yellow form or pink MOST form)     "MOST" Form in Place?        TOTAL TIME TAKING CARE OF THIS PATIENT: 35 minutes.    Vaughan Basta M.D on 05/23/2015 at 10:15 AM  Between 7am to 6pm - Pager - [phone]  After 6pm go to [email or web address] - password [email or web address] [facility]  [facility]  Office  [phone]  CC: Primary care physician; No primary care provider on file.   Note: This dictation was prepared with Dragon dictation along with smaller phrase technology. Any transcriptional errors that result from this process are unintentional.

## 2015-05-23 NOTE — Clinical Social Work Note (Signed)
PT note sent to [facility] this afternoon and will await reauth. [facility] already has discharge information. Patient's wife is aware that we are waiting on reauth. Patient to transport via EMS. Shela Leff MSW,LCSW [phone]

## 2015-05-23 NOTE — Discharge Instructions (Signed)
Wound Care-   for right elbow- Silicone foam dressings to absorb drainage and protect skin. Change every 3 days and PRN saturation.   Nonhealing ulcer to left posterior lower leg- Cleanse wound to left posterior lower leg with NS and pat gently dry. Apply santyl ointment to wound bed. Cover with NS moist gauze, 4x4 and kerlix. Change daily.

## 2015-05-23 NOTE — Progress Notes (Signed)
MD ordered patient to be discharged back to SNF.  Report was called to Wyline Beady, LPN at [facility] and she voiced understanding.  IV was removed with catheter intact.  All dressing were changed.  Prescription was placed in the packet that went with EMS.  Patient was transported via stretcher escorted by EMS.

## 2015-05-23 NOTE — Clinical Social Work Note (Signed)
CSW contacted Suanne Marker at [facility]: [phone] and was told that she had the PT note but that she was very busy and she has 4 hours to get me the auth. Suanne Marker stated she would have the reauth today by 5pm. CSW has updated patient's nurse. Shela Leff MSW,LCSW [phone]

## 2015-05-24 NOTE — Clinical Social Work Note (Signed)
CSW was off site and received voicemail at 5:15 yesterday afternoon stating that patient had been reauthed to return to [facility]. Patient was in agreement to transfer yesterday evening to [facility]. Kim at [facility] was aware as well. Shela Leff MSW,LCSW [phone]

## 2015-05-25 ENCOUNTER — Encounter: Payer: Self-pay | Admitting: Gastroenterology

## 2015-05-30 ENCOUNTER — Inpatient Hospital Stay
Admission: EM | Admit: 2015-05-30 | Discharge: 2015-06-10 | DRG: 378 | Disposition: A | Discharge status: Skilled Nursing Facility | Payer: Medicare Other | Attending: Internal Medicine | Admitting: Internal Medicine

## 2015-05-30 ENCOUNTER — Encounter: Payer: Self-pay | Admitting: Emergency Medicine

## 2015-05-30 DIAGNOSIS — K228 Other specified diseases of esophagus: Secondary | ICD-10-CM | POA: Diagnosis present

## 2015-05-30 DIAGNOSIS — C7951 Secondary malignant neoplasm of bone: Secondary | ICD-10-CM | POA: Diagnosis present

## 2015-05-30 DIAGNOSIS — R000 Tachycardia, unspecified: Secondary | ICD-10-CM | POA: Diagnosis present

## 2015-05-30 DIAGNOSIS — Z66 Do not resuscitate: Secondary | ICD-10-CM | POA: Diagnosis present

## 2015-05-30 DIAGNOSIS — D649 Anemia, unspecified: Secondary | ICD-10-CM

## 2015-05-30 DIAGNOSIS — Z7901 Long term (current) use of anticoagulants: Secondary | ICD-10-CM

## 2015-05-30 DIAGNOSIS — K264 Chronic or unspecified duodenal ulcer with hemorrhage: Secondary | ICD-10-CM | POA: Diagnosis present

## 2015-05-30 DIAGNOSIS — Z96651 Presence of right artificial knee joint: Secondary | ICD-10-CM | POA: Diagnosis present

## 2015-05-30 DIAGNOSIS — K922 Gastrointestinal hemorrhage, unspecified: Secondary | ICD-10-CM | POA: Diagnosis present

## 2015-05-30 DIAGNOSIS — I872 Venous insufficiency (chronic) (peripheral): Secondary | ICD-10-CM | POA: Diagnosis present

## 2015-05-30 DIAGNOSIS — L97929 Non-pressure chronic ulcer of unspecified part of left lower leg with unspecified severity: Secondary | ICD-10-CM | POA: Diagnosis present

## 2015-05-30 DIAGNOSIS — M545 Low back pain: Secondary | ICD-10-CM | POA: Diagnosis present

## 2015-05-30 DIAGNOSIS — G629 Polyneuropathy, unspecified: Secondary | ICD-10-CM | POA: Diagnosis present

## 2015-05-30 DIAGNOSIS — Z91040 Latex allergy status: Secondary | ICD-10-CM

## 2015-05-30 DIAGNOSIS — I482 Chronic atrial fibrillation: Secondary | ICD-10-CM | POA: Diagnosis present

## 2015-05-30 DIAGNOSIS — K567 Ileus, unspecified: Secondary | ICD-10-CM | POA: Diagnosis not present

## 2015-05-30 DIAGNOSIS — Z79891 Long term (current) use of opiate analgesic: Secondary | ICD-10-CM | POA: Diagnosis not present

## 2015-05-30 DIAGNOSIS — G8929 Other chronic pain: Secondary | ICD-10-CM | POA: Diagnosis present

## 2015-05-30 DIAGNOSIS — I959 Hypotension, unspecified: Secondary | ICD-10-CM | POA: Diagnosis present

## 2015-05-30 DIAGNOSIS — Z87891 Personal history of nicotine dependence: Secondary | ICD-10-CM | POA: Diagnosis not present

## 2015-05-30 DIAGNOSIS — I5032 Chronic diastolic (congestive) heart failure: Secondary | ICD-10-CM | POA: Diagnosis present

## 2015-05-30 DIAGNOSIS — E785 Hyperlipidemia, unspecified: Secondary | ICD-10-CM | POA: Diagnosis present

## 2015-05-30 DIAGNOSIS — C61 Malignant neoplasm of prostate: Secondary | ICD-10-CM | POA: Diagnosis present

## 2015-05-30 DIAGNOSIS — D62 Acute posthemorrhagic anemia: Secondary | ICD-10-CM | POA: Diagnosis present

## 2015-05-30 DIAGNOSIS — J449 Chronic obstructive pulmonary disease, unspecified: Secondary | ICD-10-CM | POA: Diagnosis present

## 2015-05-30 DIAGNOSIS — K5900 Constipation, unspecified: Secondary | ICD-10-CM | POA: Diagnosis present

## 2015-05-30 DIAGNOSIS — Z79899 Other long term (current) drug therapy: Secondary | ICD-10-CM

## 2015-05-30 DIAGNOSIS — Z792 Long term (current) use of antibiotics: Secondary | ICD-10-CM | POA: Diagnosis not present

## 2015-05-30 DIAGNOSIS — Z7982 Long term (current) use of aspirin: Secondary | ICD-10-CM

## 2015-05-30 DIAGNOSIS — E876 Hypokalemia: Secondary | ICD-10-CM | POA: Diagnosis present

## 2015-05-30 DIAGNOSIS — E871 Hypo-osmolality and hyponatremia: Secondary | ICD-10-CM | POA: Diagnosis not present

## 2015-05-30 DIAGNOSIS — K2970 Gastritis, unspecified, without bleeding: Secondary | ICD-10-CM | POA: Diagnosis present

## 2015-05-30 LAB — COMPREHENSIVE METABOLIC PANEL
ALBUMIN: 2 g/dL — AB (ref 3.5–5.0)
ALT: 18 U/L (ref 17–63)
ANION GAP: 5 (ref 5–15)
AST: 27 U/L (ref 15–41)
Alkaline Phosphatase: 94 U/L (ref 38–126)
BUN: 22 mg/dL — AB (ref 6–20)
CHLORIDE: 99 mmol/L — AB (ref 101–111)
CO2: 32 mmol/L (ref 22–32)
Calcium: 8 mg/dL — ABNORMAL LOW (ref 8.9–10.3)
Creatinine, Ser: 1.05 mg/dL (ref 0.61–1.24)
GFR calc Af Amer: >60 mL/min (ref >60)
GFR calc non Af Amer: >60 mL/min (ref >60)
GLUCOSE: 146 mg/dL — AB (ref 65–99)
POTASSIUM: 3.8 mmol/L (ref 3.5–5.1)
Sodium: 136 mmol/L (ref 135–145)
TOTAL PROTEIN: 4.5 g/dL — AB (ref 6.5–8.1)
Total Bilirubin: 0.8 mg/dL (ref 0.3–1.2)

## 2015-05-30 LAB — CBC WITH DIFFERENTIAL/PLATELET
BASOS PCT: 0 %
Basophils Absolute: 0 10*3/uL (ref 0–0.1)
EOS ABS: 0 10*3/uL (ref 0–0.7)
Eosinophils Relative: 0 %
HEMATOCRIT: 22.3 % — AB (ref 40.0–52.0)
HEMOGLOBIN: 7.3 g/dL — AB (ref 13.0–18.0)
LYMPHS ABS: 0.2 10*3/uL — AB (ref 1.0–3.6)
Lymphocytes Relative: 2 %
MCH: 33.7 pg (ref 26.0–34.0)
MCHC: 32.7 g/dL (ref 32.0–36.0)
MCV: 103.1 fL — ABNORMAL HIGH (ref 80.0–100.0)
MONOS PCT: 3 %
Monocytes Absolute: 0.3 10*3/uL (ref 0.2–1.0)
NEUTROS ABS: 7.4 10*3/uL — AB (ref 1.4–6.5)
NEUTROS PCT: 95 %
Platelets: 145 10*3/uL — ABNORMAL LOW (ref 150–440)
RBC: 2.17 MIL/uL — AB (ref 4.40–5.90)
RDW: 18.3 % — ABNORMAL HIGH (ref 11.5–14.5)
WBC: 7.9 10*3/uL (ref 3.8–10.6)

## 2015-05-30 LAB — LIPASE, BLOOD: LIPASE: 21 U/L (ref 11–51)

## 2015-05-30 LAB — PROTIME-INR
INR: 1.09
Prothrombin Time: 14.3 seconds (ref 11.4–15.0)

## 2015-05-30 LAB — TROPONIN I: Troponin I: <0.03 ng/mL (ref <0.031)

## 2015-05-30 LAB — PREPARE RBC (CROSSMATCH)

## 2015-05-30 MED ORDER — ONDANSETRON HCL 4 MG PO TABS: 4.0000 mg | ORAL_TABLET | Freq: Four times a day (QID) | ORAL | Status: DC | PRN

## 2015-05-30 MED ORDER — LEUPROLIDE ACETATE (6 MONTH) 45 MG IM KIT: 45.0000 mg | PACK | INTRAMUSCULAR | Status: DC

## 2015-05-30 MED ORDER — ACETAMINOPHEN 650 MG RE SUPP: 650.0000 mg | Freq: Four times a day (QID) | RECTAL | Status: DC | PRN

## 2015-05-30 MED ORDER — OXYCODONE HCL 5 MG PO TABS
ORAL_TABLET | ORAL | Status: AC
  Filled 2015-05-30: qty 1

## 2015-05-30 MED ORDER — SODIUM CHLORIDE 0.9 % IV SOLN
10.0000 mL/h | Freq: Once | INTRAVENOUS | Status: AC
  Administered 2015-06-02: 16:00:00 via INTRAVENOUS

## 2015-05-30 MED ORDER — SALINE SPRAY 0.65 % NA SOLN
1.0000 | NASAL | Status: DC | PRN
  Filled 2015-05-30: qty 44

## 2015-05-30 MED ORDER — LORAZEPAM 0.5 MG PO TABS: 0.2500 mg | ORAL_TABLET | ORAL | Status: DC | PRN

## 2015-05-30 MED ORDER — ACETAMINOPHEN 325 MG PO TABS
650.0000 mg | ORAL_TABLET | Freq: Four times a day (QID) | ORAL | Status: DC | PRN
  Administered 2015-05-30 – 2015-06-09 (×13): 650 mg via ORAL
  Filled 2015-05-30 (×13): qty 2

## 2015-05-30 MED ORDER — OXYCODONE HCL 5 MG PO TABS: 5.0000 mg | ORAL_TABLET | Freq: Four times a day (QID) | ORAL | Status: DC | PRN

## 2015-05-30 MED ORDER — TRAZODONE HCL 50 MG PO TABS
50.0000 mg | ORAL_TABLET | Freq: Every evening | ORAL | Status: DC | PRN
  Administered 2015-05-30 – 2015-06-09 (×9): 50 mg via ORAL
  Filled 2015-05-30 (×9): qty 1

## 2015-05-30 MED ORDER — TIOTROPIUM BROMIDE MONOHYDRATE 18 MCG IN CAPS
18.0000 ug | ORAL_CAPSULE | Freq: Every day | RESPIRATORY_TRACT | Status: DC
  Administered 2015-05-31 – 2015-06-10 (×10): 18 ug via RESPIRATORY_TRACT
  Filled 2015-05-30 (×3): qty 5

## 2015-05-30 MED ORDER — ONDANSETRON HCL 4 MG/2ML IJ SOLN
4.0000 mg | Freq: Four times a day (QID) | INTRAMUSCULAR | Status: DC | PRN
  Administered 2015-06-02: 4 mg via INTRAVENOUS
  Filled 2015-05-30: qty 2

## 2015-05-30 MED ORDER — SODIUM CHLORIDE 0.9 % IV BOLUS (SEPSIS)
1000.0000 mL | Freq: Once | INTRAVENOUS | Status: AC
  Administered 2015-05-30: 1000 mL via INTRAVENOUS

## 2015-05-30 MED ORDER — POTASSIUM CHLORIDE IN NACL 20-0.9 MEQ/L-% IV SOLN
INTRAVENOUS | Status: DC
  Administered 2015-05-30 – 2015-06-04 (×8): via INTRAVENOUS
  Filled 2015-05-30 (×14): qty 1000

## 2015-05-30 MED ORDER — PREDNISONE 10 MG PO TABS
10.0000 mg | ORAL_TABLET | Freq: Every day | ORAL | Status: DC
  Administered 2015-06-02 – 2015-06-10 (×8): 10 mg via ORAL
  Filled 2015-05-30 (×9): qty 1

## 2015-05-30 MED ORDER — POTASSIUM CHLORIDE CRYS ER 20 MEQ PO TBCR: 40.0000 meq | EXTENDED_RELEASE_TABLET | Freq: Every day | ORAL | Status: DC

## 2015-05-30 MED ORDER — ALBUTEROL SULFATE (2.5 MG/3ML) 0.083% IN NEBU: 2.5000 mg | INHALATION_SOLUTION | RESPIRATORY_TRACT | Status: DC | PRN

## 2015-05-30 MED ORDER — MOMETASONE FURO-FORMOTEROL FUM 100-5 MCG/ACT IN AERO
2.0000 | INHALATION_SPRAY | Freq: Two times a day (BID) | RESPIRATORY_TRACT | Status: DC
  Administered 2015-05-31 – 2015-06-10 (×21): 2 via RESPIRATORY_TRACT
  Filled 2015-05-30 (×2): qty 8.8

## 2015-05-30 MED ORDER — SODIUM CHLORIDE 0.9 % IV SOLN
80.0000 mg | Freq: Once | INTRAVENOUS | Status: AC
  Administered 2015-05-30: 80 mg via INTRAVENOUS
  Filled 2015-05-30: qty 80

## 2015-05-30 MED ORDER — GABAPENTIN 300 MG PO CAPS
300.0000 mg | ORAL_CAPSULE | Freq: Two times a day (BID) | ORAL | Status: DC
  Administered 2015-05-30 – 2015-06-06 (×12): 300 mg via ORAL
  Filled 2015-05-30 (×13): qty 1

## 2015-05-30 MED ORDER — COLLAGENASE 250 UNIT/GM EX OINT
1.0000 "application " | TOPICAL_OINTMENT | Freq: Two times a day (BID) | CUTANEOUS | Status: DC
  Administered 2015-05-31 – 2015-06-10 (×18): 1 via TOPICAL
  Filled 2015-05-30 (×4): qty 30

## 2015-05-30 NOTE — ED Notes (Signed)
Pt to floor via stretcher with blood infusing, on telemetry with Amy, RN and Tammy, ED Tech.

## 2015-05-30 NOTE — ED Provider Notes (Signed)
[facility] Emergency Department Provider Note  ____________________________________________  Time seen: 2:40 PM  I have reviewed the triage vital signs and the nursing notes.   HISTORY  Chief Complaint Rectal Bleeding and Shortness of Breath    HPI Chad Kirby is a 82 y.o. male coming to the ED with rectal bleeding for the past 2 days as well as shortness of breath. He normally is not very active but now he gets very short of breath with any attempt to sit upright and get out of bed. He also complains of a long-standing wound on the left leg which is improving but sometimes hurts. No chest pain, denies dizziness while lying in bed but gets dizzy when he sits upright. No vomiting. No nausea or abdominal pain.  Has a history of GI bleed from peptic ulcer. Also has a history of Coumadin use related to atrial fibrillation which is chronic and persistent.     Past Medical History  Diagnosis Date  . COPD (chronic obstructive pulmonary disease) ([place])   . Cancer [facility])     Prostate with bone mets-current chemo treatment  . Hyperlipidemia   . Atrial fibrillation [facility])      Patient Active Problem List   Diagnosis Date Noted  . Lower GI bleed 05/15/2015  . Cellulitis of left lower extremity   . Atrial fibrillation with RVR ([place]) 05/01/2015  . Sepsis ([place]) 05/01/2015  . Cellulitis and abscess of leg 05/01/2015     Past Surgical History  Procedure Laterality Date  . Replacement total knee Right   . Colonoscopy with propofol N/A 05/18/2015    Procedure: COLONOSCOPY WITH PROPOFOL;  Surgeon: Hulen Luster, MD;  Location: [facility] ENDOSCOPY;  Service: Endoscopy;  Laterality: N/A;  . Esophagogastroduodenoscopy N/A 05/19/2015    Procedure: ESOPHAGOGASTRODUODENOSCOPY (EGD);  Surgeon: Hulen Luster, MD;  Location: [facility] ENDOSCOPY;  Service: Endoscopy;  Laterality: N/A;     Current Outpatient Rx  Name  Route  Sig  Dispense  Refill  . acetaminophen (TYLENOL) 500 MG  tablet   Oral   Take 500 mg by mouth every 6 (six) hours as needed. For pain.         Marland Kitchen albuterol (PROVENTIL) (2.5 MG/3ML) 0.083% nebulizer solution   Nebulization   Take 3 mLs (2.5 mg total) by nebulization every 4 (four) hours as needed for wheezing or shortness of breath.   75 mL   12   . amiodarone (PACERONE) 200 MG tablet   Oral   Take 1 tablet (200 mg total) by mouth daily.   30 tablet   0   . amoxicillin-clavulanate (AUGMENTIN) 500-125 MG tablet   Oral   Take 1 tablet (500 mg total) by mouth 3 (three) times daily.   12 tablet   0   . aspirin EC 81 MG EC tablet   Oral   Take 1 tablet (81 mg total) by mouth daily.   30 tablet   0   . atorvastatin (LIPITOR) 80 MG tablet   Oral   Take 80 mg by mouth every evening.      1   . budesonide-formoterol (SYMBICORT) 80-4.5 MCG/ACT inhaler   Inhalation   Inhale 2 puffs into the lungs 2 (two) times daily.         . Calcium Carbonate-Vitamin D 600-400 MG-UNIT tablet   Oral   Take 2 tablets by mouth 2 (two) times daily.         . collagenase (SANTYL) ointment  Topical   Apply 1 application topically 2 (two) times daily. Apply a thick layer to posterior left lower leg, cover with NS damp to dry gauze, wrap with kerlix         . diltiazem (CARDIZEM CD) 180 MG 24 hr capsule   Oral   Take 360 mg by mouth every morning.      1   . feeding supplement, ENSURE ENLIVE, (ENSURE ENLIVE) LIQD   Oral   Take 237 mLs by mouth 2 (two) times daily between meals.   237 mL   12   . furosemide (LASIX) 20 MG tablet   Oral   Take 20 mg by mouth daily.         Marland Kitchen gabapentin (NEURONTIN) 300 MG capsule   Oral   Take 1 capsule (300 mg total) by mouth 2 (two) times daily.   60 capsule   0   . Leuprolide Acetate, 6 Month, (LUPRON) 45 MG injection   Intramuscular   Inject 45 mg into the muscle every 6 (six) months.         Marland Kitchen LORazepam (ATIVAN) 0.5 MG tablet   Oral   Take 0.25 mg by mouth every 4 (four) hours as needed  for anxiety.         Marland Kitchen morphine (ROXANOL) 20 MG/ML concentrated solution   Oral   Take 5-10 mg by mouth every hour as needed for severe pain.         Marland Kitchen oxyCODONE (OXY IR/ROXICODONE) 5 MG immediate release tablet   Oral   Take 1 tablet (5 mg total) by mouth every 4 (four) hours as needed for moderate pain.   30 tablet   0   . pantoprazole (PROTONIX) 40 MG tablet   Oral   Take 1 tablet (40 mg total) by mouth 2 (two) times daily.   60 tablet   0   . polyethylene glycol (MIRALAX / GLYCOLAX) packet   Oral   Take 17 g by mouth 2 (two) times daily.   14 each   0   . potassium chloride SA (K-DUR,KLOR-CON) 20 MEQ tablet   Oral   Take 40 mEq by mouth daily.         . predniSONE (DELTASONE) 10 MG tablet   Oral   Take 10 mg by mouth daily.      9   . prochlorperazine (COMPAZINE) 10 MG tablet   Oral   Take 10 mg by mouth every 6 (six) hours as needed. For nausea and vomiting.         . senna-docusate (SENOKOT-S) 8.6-50 MG tablet   Oral   Take 2 tablets by mouth 2 (two) times daily.         . sodium chloride (OCEAN) 0.65 % SOLN nasal spray   Each Nare   Place 1 spray into both nostrils as needed for congestion.         Marland Kitchen tiotropium (SPIRIVA) 18 MCG inhalation capsule   Inhalation   Place 18 mcg into inhaler and inhale daily.         . traZODone (DESYREL) 50 MG tablet   Oral   Take 50 mg by mouth at bedtime as needed for sleep.            Allergies Latex   Family History  Problem Relation Age of Onset  . Brain cancer Sister   . Stroke Father     Social History Social History  Substance Use Topics  .  Smoking status: Former Research scientist (life sciences)  . Smokeless tobacco: None  . Alcohol Use: 0.0 oz/week    0 Standard drinks or equivalent per week     Comment: occasional    Review of Systems  Constitutional:   No fever or chills. No weight changes Eyes:   No vision changes.  ENT:   No sore throat. No rhinorrhea. Cardiovascular:   No chest  pain. Respiratory:   No dyspnea or cough. Gastrointestinal:   Negative for abdominal pain, vomiting and diarrhea.  Positive bloody stools Genitourinary:   Negative for dysuria or difficulty urinating. Musculoskeletal:   Negative for focal pain or swelling Skin:   Negative for rash. Neurological:   Negative for headaches, focal weakness or numbness. Positive dizziness with sitting upright  10-point ROS otherwise negative.  ____________________________________________   PHYSICAL EXAM:  VITAL SIGNS: ED Triage Vitals  Enc Vitals Group     BP 05/30/15 1432 95/55 mmHg     Pulse Rate 05/30/15 1430 143     Resp 05/30/15 1434 18     Temp 05/30/15 1434 98.7 F (37.1 C)     Temp Source 05/30/15 1434 Oral     SpO2 05/30/15 1430 86 %     Weight 05/30/15 1434 230 lb (104.327 kg)     Height 05/30/15 1434 6\' 1"  (1.854 m)     Head Cir --      Peak Flow --      Pain Score 05/30/15 1435 0     Pain Loc --      Pain Edu? --      Excl. in [place]? --     Vital signs reviewed, nursing assessments reviewed.   Constitutional:   Alert and oriented. Well appearing and in no distress. Eyes:   No scleral icterus. Significant conjunctival pallor. PERRL. EOMI ENT   Head:   Normocephalic and atraumatic.   Nose:   No congestion/rhinnorhea. No septal hematoma   Mouth/Throat:   Dry mucous members, no pharyngeal erythema. No peritonsillar mass.    Neck:   No stridor. No SubQ emphysema. No meningismus. Hematological/Lymphatic/Immunilogical:   No cervical lymphadenopathy. Cardiovascular:   Irregularly irregular rhythm heart rate 120 to 1:30. Symmetric bilateral radial and DP pulses.  No murmurs.  Respiratory:   Normal respiratory effort without tachypnea nor retractions. Breath sounds are clear and equal bilaterally. No wheezes/rales/rhonchi. Gastrointestinal:   Soft and nontender. Non distended. There is no CVA tenderness.  No rebound, rigidity, or guarding. Rectal exam reveals red blood at the  anus. Digital exam does reveal gross red blood. This is Hemoccult positive of course. Genitourinary:   Normal Musculoskeletal:   Nontender with normal range of motion in all extremities. No joint effusions.  No lower extremity tenderness.  No edema. Neurologic:   Normal speech and language.  CN 2-10 normal. Motor grossly intact. No gross focal neurologic deficits are appreciated.  Skin:    Skin is warm, dry and intact. No rash noted.  No petechiae, purpura, or bullae. Psychiatric:   Mood and affect are normal. ____________________________________________    LABS (pertinent positives/negatives) (all labs ordered are listed, but only abnormal results are displayed) Labs Reviewed  COMPREHENSIVE METABOLIC PANEL - Abnormal; Notable for the following:    Chloride 99 (*)    Glucose, Bld 146 (*)    BUN 22 (*)    Calcium 8.0 (*)    Total Protein 4.5 (*)    Albumin 2.0 (*)    All other components within normal limits  CBC  WITH DIFFERENTIAL/PLATELET - Abnormal; Notable for the following:    RBC 2.17 (*)    Hemoglobin 7.3 (*)    HCT 22.3 (*)    MCV 103.1 (*)    RDW 18.3 (*)    Platelets 145 (*)    Neutro Abs 7.4 (*)    Lymphs Abs 0.2 (*)    All other components within normal limits  LIPASE, BLOOD  TROPONIN I  PROTIME-INR  TYPE AND SCREEN  PREPARE RBC (CROSSMATCH)   ____________________________________________   EKG    ____________________________________________    RADIOLOGY    ____________________________________________   PROCEDURES CRITICAL CARE Performed by: Joni Fears, Emran Molzahn   Total critical care time: 35 minutes  Critical care time was exclusive of separately billable procedures and treating other patients.  Critical care was necessary to treat or prevent imminent or life-threatening deterioration.  Critical care was time spent personally by me on the following activities: development of treatment plan with patient and/or surrogate as well as nursing,  discussions with consultants, evaluation of patient's response to treatment, examination of patient, obtaining history from patient or surrogate, ordering and performing treatments and interventions, ordering and review of laboratory studies, ordering and review of radiographic studies, pulse oximetry and re-evaluation of patient's condition.   ____________________________________________   INITIAL IMPRESSION / ASSESSMENT AND PLAN / ED COURSE  Pertinent labs & imaging results that were available during my care of the patient were reviewed by me and considered in my medical decision making (see chart for details).  Patient presents with frank GI bleed, likely related to history of peptic ulcer disease and Coumadin use. Patient is unsure if he is still taking Coumadin. We'll check labs and give IV fluids and plan for admission. Patient is previously had blood transfusions and verbally consents for repeated transfusion if his blood counts are low are again.  ----------------------------------------- 3:23 PM on 05/30/2015 -----------------------------------------  Blood pressure gradually declining, most recently about 85/65. Hemoglobin is 7.3 which is a 1. drop from his most recent. INR is 1.1 so he would not benefit from any anticoagulation reversal. We'll plan to transfuse the patient 1 L of packed red blood cells and admitted to the hospital. IV Protonix bolus. No evidence of ascites on exam, no history of liver failure to suggest portal hypertension.     ____________________________________________   FINAL CLINICAL IMPRESSION(S) / ED DIAGNOSES  Final diagnoses:  Acute GI hemorrhage  Symptomatic anemia      Carrie Mew, MD 05/30/15 1525

## 2015-05-30 NOTE — ED Notes (Signed)
Pt placed in trendelenburg for bp 87/64. Pt denies complaints, denies any knowledge of CHF. Family at bedside.

## 2015-05-30 NOTE — ED Notes (Signed)
Pt here with from skilled nursing facility via EMS with c/o black stools for 2 days now. Pt also c/o sob, RA sats 84%, O2 applied @ 3L per [place]. Pt appears pale.

## 2015-05-30 NOTE — H&P (Addendum)
[facility] [place] NAME: Chad Kirby    MR#:  [id number]  DATE OF BIRTH:  [birth year]  DATE OF ADMISSION:  05/30/2015  PRIMARY CARE PHYSICIAN: No primary care provider on file.   REQUESTING/REFERRING PHYSICIAN:  Carrie Mew, MD  CHIEF COMPLAINT:   Chief Complaint  Patient presents with  . Rectal Bleeding  . Shortness of Breath  Rectal bleeding since yesterday.  HISTORY OF PRESENT ILLNESS:  Chad Kirby  is a 82 y.o. male with a known history of Recent GI bleeding, A. fib, COPD and processes cancer with metastasis. Patient has chronic A. fib and was on Coumadin, which was stopped due to recent GI bleeding. Patient got endoscopy which showed duodenal ulcer and colon polyps. He is taking aspirin for his A. fib. He has had rectal bleeding 2 episode since yesterday. He complains of dizziness, generalized weakness and shortness of breath. But he denies any fever, chills, chest pain or abdominal pain. His hemoglobin decreased to 7.3.  PAST MEDICAL HISTORY:   Past Medical History  Diagnosis Date  . COPD (chronic obstructive pulmonary disease) ([place])   . Cancer [facility])     Prostate with bone mets-current chemo treatment  . Hyperlipidemia   . Atrial fibrillation ([employer])     PAST SURGICAL HISTORY:   Past Surgical History  Procedure Laterality Date  . Replacement total knee Right   . Colonoscopy with propofol N/A 05/18/2015    Procedure: COLONOSCOPY WITH PROPOFOL;  Surgeon: Hulen Luster, MD;  Location: [facility] ENDOSCOPY;  Service: Endoscopy;  Laterality: N/A;  . Esophagogastroduodenoscopy N/A 05/19/2015    Procedure: ESOPHAGOGASTRODUODENOSCOPY (EGD);  Surgeon: Hulen Luster, MD;  Location: [facility] ENDOSCOPY;  Service: Endoscopy;  Laterality: N/A;    SOCIAL HISTORY:   Social History  Substance Use Topics  . Smoking status: Former Research scientist (life sciences)  . Smokeless tobacco: Not on file  . Alcohol Use: 0.0 oz/week    0 Standard drinks or equivalent per  week     Comment: occasional    FAMILY HISTORY:   Family History  Problem Relation Age of Onset  . Brain cancer Sister   . Stroke Father     DRUG ALLERGIES:   Allergies  Allergen Reactions  . Latex Rash    REVIEW OF SYSTEMS:  CONSTITUTIONAL: No fever, Has dizziness and generalized weakness.  EYES: No blurred or double vision.  EARS, NOSE, AND THROAT: No tinnitus or ear pain.  RESPIRATORY: No cough, shortness of breath, wheezing or hemoptysis.  CARDIOVASCULAR: No chest pain, orthopnea, but has chronic leg edema.  GASTROINTESTINAL: No nausea, vomiting, diarrhea or abdominal pain. No melena but has rectal bleeding. GENITOURINARY: No dysuria, hematuria.  ENDOCRINE: No polyuria, nocturia,  HEMATOLOGY: No anemia, easy bruising or bleeding SKIN: No rash or lesion. MUSCULOSKELETAL: No joint pain or arthritis.   NEUROLOGIC: No tingling, numbness, weakness.  PSYCHIATRY: No anxiety or depression.   MEDICATIONS AT HOME:   Prior to Admission medications   Medication Sig Start Date End Date Taking? Authorizing Provider  acetaminophen (TYLENOL) 500 MG tablet Take 500 mg by mouth every 6 (six) hours as needed. For pain.    Historical Provider, MD  albuterol (PROVENTIL) (2.5 MG/3ML) 0.083% nebulizer solution Take 3 mLs (2.5 mg total) by nebulization every 4 (four) hours as needed for wheezing or shortness of breath. 05/06/15   [name], MD  amiodarone (PACERONE) 200 MG tablet Take 1 tablet (200 mg total) by mouth daily. 05/06/15   [name],  MD  amoxicillin-clavulanate (AUGMENTIN) 500-125 MG tablet Take 1 tablet (500 mg total) by mouth 3 (three) times daily. 05/06/15   [name], MD  aspirin EC 81 MG EC tablet Take 1 tablet (81 mg total) by mouth daily. 05/23/15   Vaughan Basta, MD  atorvastatin (LIPITOR) 80 MG tablet Take 80 mg by mouth every evening. 04/26/15   Historical Provider, MD  budesonide-formoterol (SYMBICORT) 80-4.5 MCG/ACT inhaler Inhale 2 puffs into the lungs 2 (two)  times daily.    Historical Provider, MD  Calcium Carbonate-Vitamin D 600-400 MG-UNIT tablet Take 2 tablets by mouth 2 (two) times daily. 12/15/09   Historical Provider, MD  collagenase (SANTYL) ointment Apply 1 application topically 2 (two) times daily. Apply a thick layer to posterior left lower leg, cover with NS damp to dry gauze, wrap with kerlix    Historical Provider, MD  diltiazem (CARDIZEM CD) 180 MG 24 hr capsule Take 360 mg by mouth every morning. 04/26/15   Historical Provider, MD  feeding supplement, ENSURE ENLIVE, (ENSURE ENLIVE) LIQD Take 237 mLs by mouth 2 (two) times daily between meals. 05/23/15   Vaughan Basta, MD  furosemide (LASIX) 20 MG tablet Take 20 mg by mouth daily.    Historical Provider, MD  gabapentin (NEURONTIN) 300 MG capsule Take 1 capsule (300 mg total) by mouth 2 (two) times daily. 05/06/15   [name], MD  Leuprolide Acetate, 6 Month, (LUPRON) 45 MG injection Inject 45 mg into the muscle every 6 (six) months.    Historical Provider, MD  LORazepam (ATIVAN) 0.5 MG tablet Take 0.25 mg by mouth every 4 (four) hours as needed for anxiety.    Historical Provider, MD  morphine (ROXANOL) 20 MG/ML concentrated solution Take 5-10 mg by mouth every hour as needed for severe pain.    Historical Provider, MD  oxyCODONE (OXY IR/ROXICODONE) 5 MG immediate release tablet Take 1 tablet (5 mg total) by mouth every 4 (four) hours as needed for moderate pain. 05/23/15   Vaughan Basta, MD  pantoprazole (PROTONIX) 40 MG tablet Take 1 tablet (40 mg total) by mouth 2 (two) times daily. 05/23/15   Vaughan Basta, MD  polyethylene glycol (MIRALAX / GLYCOLAX) packet Take 17 g by mouth 2 (two) times daily. 05/06/15   [name], MD  potassium chloride SA (K-DUR,KLOR-CON) 20 MEQ tablet Take 40 mEq by mouth daily.    Historical Provider, MD  predniSONE (DELTASONE) 10 MG tablet Take 10 mg by mouth daily. 04/14/15   Historical Provider, MD  prochlorperazine (COMPAZINE) 10 MG tablet  Take 10 mg by mouth every 6 (six) hours as needed. For nausea and vomiting. 04/14/15   Historical Provider, MD  senna-docusate (SENOKOT-S) 8.6-50 MG tablet Take 2 tablets by mouth 2 (two) times daily. 05/06/15   [name], MD  sodium chloride (OCEAN) 0.65 % SOLN nasal spray Place 1 spray into both nostrils as needed for congestion.    Historical Provider, MD  tiotropium (SPIRIVA) 18 MCG inhalation capsule Place 18 mcg into inhaler and inhale daily.    Historical Provider, MD  traZODone (DESYREL) 50 MG tablet Take 50 mg by mouth at bedtime as needed for sleep.    Historical Provider, MD      VITAL SIGNS:  Blood pressure 87/64, pulse 111, temperature 98.7 F (37.1 C), temperature source Oral, resp. rate 18, height 6\' 1"  (1.854 m), weight 104.327 kg (230 lb), SpO2 98 %.  PHYSICAL EXAMINATION:  GENERAL:  82 y.o.-year-old patient lying in the bed with no acute distress.  Looks pale and fragile. EYES: Pupils equal, round, reactive to light and accommodation. No scleral icterus. Extraocular muscles intact. Pale conjunctiva. HEENT: Head atraumatic, normocephalic. Oropharynx and nasopharynx clear.  NECK:  Supple, no jugular venous distention. No thyroid enlargement, no tenderness.  LUNGS: Normal breath sounds bilaterally, no wheezing, rales,rhonchi or crepitation. No use of accessory muscles of respiration.  CARDIOVASCULAR: S1, S2 tachycardia. No murmurs, rubs, or gallops.  ABDOMEN: Soft, nontender, nondistended. Bowel sounds present. No organomegaly or mass.  EXTREMITIES: Bilateral leg edema 1-2+ with chronic skin changes, no cyanosis, or clubbing.  NEUROLOGIC: Cranial nerves II through XII are intact. Muscle strength 4/5 in all extremities. Sensation intact. Gait not checked.  PSYCHIATRIC: The patient is alert and oriented x 3.  SKIN: No obvious rash, lesion, or ulcer.   LABORATORY PANEL:   CBC  Recent Labs Lab 05/30/15 1437  WBC 7.9  HGB 7.3*  HCT 22.3*  PLT 145*    ------------------------------------------------------------------------------------------------------------------  Chemistries   Recent Labs Lab 05/30/15 1437  NA 136  K 3.8  CL 99*  CO2 32  GLUCOSE 146*  BUN 22*  CREATININE 1.05  CALCIUM 8.0*  AST 27  ALT 18  ALKPHOS 94  BILITOT 0.8   ------------------------------------------------------------------------------------------------------------------  Cardiac Enzymes  Recent Labs Lab 05/30/15 1437  TROPONINI <0.03   ------------------------------------------------------------------------------------------------------------------  RADIOLOGY:  No results found.  EKG:   Orders placed or performed during the hospital encounter of 05/30/15  . ED EKG  . ED EKG    IMPRESSION AND PLAN:   Acute GI bleeding with history of duodenal ulcer. The patient will be admitted to medical floor. Keep nothing by mouth with IV fluid support, Protonix IV and GI consult. Hold aspirin.  Symptomatic anemia due to acute blood loss. *PRBC transfusion and follow-up hemoglobin every 6 hours.  Hypotension. Hold Lasix and pain medication.  A. fib with RVR. Hold aspirin. Lopressor and Lasix when necessary if blood pressure allows.  COPD. Stable. DuoNeb when necessary and continue dulera.  All the records are reviewed and case discussed with ED provider. Management plans discussed with the patient, His wife and daughter and they are in agreement.  CODE STATUS: DO NOT RESUSCITATE  TOTAL TIME TAKING CARE OF THIS PATIENT: 57 minutes.    Demetrios Loll M.D on 05/30/2015 at 3:49 PM  Between 7am to 6pm - Pager - [phone]  After 6pm go to [email or web address] - password [email or web address] [facility]  [facility]  Office  [phone]  CC: Primary care physician; No primary care provider on file.

## 2015-05-30 NOTE — ED Notes (Signed)
Family at bedside, stating they feel pt was sent to rehab way too soon, requesting Dr Berline Lopes to not see the pt this visit.

## 2015-05-30 NOTE — ED Notes (Signed)
Report called to [place] on 2 C

## 2015-05-31 LAB — BASIC METABOLIC PANEL
Anion gap: 4 — ABNORMAL LOW (ref 5–15)
BUN: 24 mg/dL — AB (ref 6–20)
CHLORIDE: 102 mmol/L (ref 101–111)
CO2: 31 mmol/L (ref 22–32)
CREATININE: 1.01 mg/dL (ref 0.61–1.24)
Calcium: 7.6 mg/dL — ABNORMAL LOW (ref 8.9–10.3)
GFR calc Af Amer: >60 mL/min (ref >60)
GFR calc non Af Amer: >60 mL/min (ref >60)
GLUCOSE: 100 mg/dL — AB (ref 65–99)
Potassium: 4.3 mmol/L (ref 3.5–5.1)
Sodium: 137 mmol/L (ref 135–145)

## 2015-05-31 LAB — CBC WITH DIFFERENTIAL/PLATELET
Basophils Absolute: 0.1 10*3/uL (ref 0–0.1)
Basophils Relative: 1 %
EOS ABS: 0.1 10*3/uL (ref 0–0.7)
Eosinophils Relative: 1 %
HCT: 18.5 % — ABNORMAL LOW (ref 40.0–52.0)
HEMOGLOBIN: 6.2 g/dL — AB (ref 13.0–18.0)
LYMPHS ABS: 0.4 10*3/uL — AB (ref 1.0–3.6)
LYMPHS PCT: 6 %
MCH: 32.6 pg (ref 26.0–34.0)
MCHC: 33.6 g/dL (ref 32.0–36.0)
MCV: 97 fL (ref 80.0–100.0)
Monocytes Absolute: 0.5 10*3/uL (ref 0.2–1.0)
Monocytes Relative: 6 %
NEUTROS PCT: 86 %
Neutro Abs: 7 10*3/uL — ABNORMAL HIGH (ref 1.4–6.5)
PLATELETS: 119 10*3/uL — AB (ref 150–440)
RBC: 1.91 MIL/uL — AB (ref 4.40–5.90)
RDW: 22 % — ABNORMAL HIGH (ref 11.5–14.5)
WBC: 8.1 10*3/uL (ref 3.8–10.6)

## 2015-05-31 LAB — CBC
HCT: 22.8 % — ABNORMAL LOW (ref 40.0–52.0)
Hemoglobin: 7.6 g/dL — ABNORMAL LOW (ref 13.0–18.0)
MCH: 32.3 pg (ref 26.0–34.0)
MCHC: 33.5 g/dL (ref 32.0–36.0)
MCV: 96.5 fL (ref 80.0–100.0)
PLATELETS: 128 10*3/uL — AB (ref 150–440)
RBC: 2.36 MIL/uL — ABNORMAL LOW (ref 4.40–5.90)
RDW: 22.3 % — AB (ref 11.5–14.5)
WBC: 6.7 10*3/uL (ref 3.8–10.6)

## 2015-05-31 LAB — MAGNESIUM: Magnesium: 1.7 mg/dL (ref 1.7–2.4)

## 2015-05-31 LAB — HEMOGLOBIN AND HEMATOCRIT, BLOOD
HCT: 19.1 % — ABNORMAL LOW (ref 40.0–52.0)
HCT: 22.5 % — ABNORMAL LOW (ref 40.0–52.0)
HEMOGLOBIN: 6.4 g/dL — AB (ref 13.0–18.0)
Hemoglobin: 7.5 g/dL — ABNORMAL LOW (ref 13.0–18.0)

## 2015-05-31 LAB — PREPARE RBC (CROSSMATCH)

## 2015-05-31 MED ORDER — SODIUM CHLORIDE 0.9 % IV SOLN
Freq: Once | INTRAVENOUS | Status: AC
  Administered 2015-05-31: 20:00:00 via INTRAVENOUS

## 2015-05-31 MED ORDER — PANTOPRAZOLE SODIUM 40 MG IV SOLR: 40.0000 mg | Freq: Two times a day (BID) | INTRAVENOUS | Status: DC

## 2015-05-31 MED ORDER — SODIUM CHLORIDE 0.9 % IV SOLN
Freq: Once | INTRAVENOUS | Status: AC
  Administered 2015-05-31: 17:00:00 via INTRAVENOUS

## 2015-05-31 MED ORDER — SODIUM CHLORIDE 0.9 % IV SOLN
8.0000 mg/h | INTRAVENOUS | Status: AC
  Administered 2015-06-01 – 2015-06-03 (×5): 8 mg/h via INTRAVENOUS
  Filled 2015-05-31 (×6): qty 80

## 2015-05-31 MED ORDER — PANTOPRAZOLE SODIUM 40 MG IV SOLR
80.0000 mg | Freq: Once | INTRAVENOUS | Status: AC
  Administered 2015-05-31: 80 mg via INTRAVENOUS
  Filled 2015-05-31: qty 80

## 2015-05-31 NOTE — NC FL2 (Signed)
[place] LEVEL OF CARE SCREENING TOOL     IDENTIFICATION  Patient Name: Chad Kirby Birthdate: 30-Jun-1929 Sex: male Admission Date (Current Location): 05/30/2015  [place] and [place] Number:  Engineering geologist and Address:  [facility], [address], [place], [place] [address]      Provider Number: [id number]  Attending Physician Name and Address:  Henreitta Leber, MD  Relative Name and Phone Number:       Current Level of Care: Hospital Recommended Level of Care: [place] Prior Approval Number:    Date Approved/Denied:   PASRR Number:    Discharge Plan: SNF    Current Diagnoses: Patient Active Problem List   Diagnosis Date Noted  . GIB (gastrointestinal bleeding) 05/30/2015  . Anemia 05/30/2015  . Lower GI bleed 05/15/2015  . Cellulitis of left lower extremity   . Atrial fibrillation with RVR ([place]) 05/01/2015  . Sepsis ([place]) 05/01/2015  . Cellulitis and abscess of leg 05/01/2015    Orientation RESPIRATION BLADDER Height & Weight     Self, Place  Normal Continent Weight: 230 lb (104.327 kg) Height:  6\' 1"  (185.4 cm)  BEHAVIORAL SYMPTOMS/MOOD NEUROLOGICAL BOWEL NUTRITION STATUS   (none)  (none) Continent Diet (clear liquid)  AMBULATORY STATUS COMMUNICATION OF NEEDS Skin   Extensive Assist Verbally Normal                       Personal Care Assistance Level of Assistance  Bathing, Dressing Bathing Assistance: Limited assistance Feeding assistance: Limited assistance Dressing Assistance: Limited assistance Total Care Assistance: Limited assistance   Functional Limitations Info  Hearing   Hearing Info: Impaired      SPECIAL CARE FACTORS FREQUENCY  PT (By licensed PT)                    Contractures Contractures Info: Not present    Additional Factors Info  Code Status Code Status Info: DNR Allergies Info: latex           Current Medications (05/31/2015):  This is  the current hospital active medication list Current Facility-Administered Medications  Medication Dose Route Frequency Provider Last Rate Last Dose  . 0.9 %  sodium chloride infusion  10 mL/hr Intravenous Once Carrie Mew, MD   10 mL/hr at 05/30/15 1537  . 0.9 %  sodium chloride infusion   Intravenous Once Henreitta Leber, MD      . 0.9 % NaCl with KCl 20 mEq/ L  infusion   Intravenous Continuous Demetrios Loll, MD 75 mL/hr at 05/31/15 0959    . acetaminophen (TYLENOL) tablet 650 mg  650 mg Oral Q6H PRN Demetrios Loll, MD   650 mg at 05/31/15 1227   Or  . acetaminophen (TYLENOL) suppository 650 mg  650 mg Rectal Q6H PRN Demetrios Loll, MD      . albuterol (PROVENTIL) (2.5 MG/3ML) 0.083% nebulizer solution 2.5 mg  2.5 mg Nebulization Q2H PRN Demetrios Loll, MD      . collagenase (SANTYL) ointment 1 application  1 application Topical BID Demetrios Loll, MD   1 application at [id number]  . gabapentin (NEURONTIN) capsule 300 mg  300 mg Oral BID Demetrios Loll, MD   300 mg at 05/30/15 2200  . [START ON 10/12/2015] Leuprolide Acetate (6 Month) (LUPRON) injection 45 mg  45 mg Intramuscular Q6 months Demetrios Loll, MD      . LORazepam (ATIVAN) tablet 0.25 mg  0.25 mg  Oral Q4H PRN Demetrios Loll, MD      . mometasone-formoterol [facility]) 100-5 MCG/ACT inhaler 2 puff  2 puff Inhalation BID Demetrios Loll, MD   2 puff at 05/31/15 [phone]  . ondansetron (ZOFRAN) tablet 4 mg  4 mg Oral Q6H PRN Demetrios Loll, MD       Or  . ondansetron [facility]) injection 4 mg  4 mg Intravenous Q6H PRN Demetrios Loll, MD      . pantoprazole (PROTONIX) injection 40 mg  40 mg Intravenous Q12H Henreitta Leber, MD      . predniSONE (DELTASONE) tablet 10 mg  10 mg Oral Daily Demetrios Loll, MD   10 mg at 05/31/15 [id number]  . sodium chloride (OCEAN) 0.65 % nasal spray 1 spray  1 spray Each Nare PRN Demetrios Loll, MD      . tiotropium [facility]) inhalation capsule 18 mcg  18 mcg Inhalation Daily Demetrios Loll, MD   18 mcg at 05/31/15 [phone]  . traZODone (DESYREL) tablet 50 mg  50 mg Oral QHS PRN Demetrios Loll, MD   50 mg at 05/30/15 2157     Discharge Medications: Please see discharge summary for a list of discharge medications.  Relevant Imaging Results:  Relevant Lab Results:   Additional Information SS: [id number]  Shela Leff, LCSW

## 2015-05-31 NOTE — Progress Notes (Signed)
[place] [facility] [place] NAME: Chad Kirby    MR#:  [id number]  DATE OF BIRTH:  1929/03/28  SUBJECTIVE:   Patient here due to rectal bleeding and likely a lower GI bleed. No further bleeding today. Patient's granddaughter is at bedside. He denies any chest pain, shortness of breath.  REVIEW OF SYSTEMS:    Review of Systems  Constitutional: Negative for fever and chills.  HENT: Negative for congestion and tinnitus.   Eyes: Negative for blurred vision and double vision.  Respiratory: Negative for cough, shortness of breath and wheezing.   Cardiovascular: Negative for chest pain, orthopnea and PND.  Gastrointestinal: Positive for blood in stool. Negative for nausea, vomiting, abdominal pain and diarrhea.  Genitourinary: Negative for dysuria and hematuria.  Neurological: Positive for weakness (generalized). Negative for dizziness, sensory change and focal weakness.  All other systems reviewed and are negative.   Nutrition: Clear liquid diet Tolerating Diet: Yes Tolerating PT: Await evaluation     DRUG ALLERGIES:   Allergies  Allergen Reactions  . Latex Rash    VITALS:  Blood pressure 92/56, pulse 129, temperature 97.6 F (36.4 C), temperature source Oral, resp. rate 18, height 6\' 1"  (1.854 m), weight 104.327 kg (230 lb), SpO2 99 %.  PHYSICAL EXAMINATION:   Physical Exam  GENERAL:  82 y.o.-year-old pale appearing patient lying in the bed with no acute distress.  EYES: Pupils equal, round, reactive to light and accommodation. No scleral icterus. Pale conjunctiva Extraocular muscles intact.  HEENT: Head atraumatic, normocephalic. Oropharynx and nasopharynx clear. Dry oral mucosa NECK:  Supple, no jugular venous distention. No thyroid enlargement, no tenderness.  LUNGS: Normal breath sounds bilaterally, no wheezing, rales, rhonchi. No use of accessory muscles of respiration.  CARDIOVASCULAR: S1, S2 normal. No murmurs, rubs,  or gallops.  ABDOMEN: Soft, nontender, nondistended. Bowel sounds present. No organomegaly or mass.  EXTREMITIES: No cyanosis, clubbing or edema b/l.    NEUROLOGIC: Cranial nerves II through XII are intact. No focal Motor or sensory deficits b/l. Globally weak  PSYCHIATRIC: The patient is alert and oriented x 3.  SKIN: No obvious rash, lesion, or ulcer.    LABORATORY PANEL:   CBC  Recent Labs Lab 05/31/15 0023  WBC 6.7  HGB 7.6*  7.5*  HCT 22.8*  22.5*  PLT 128*   ------------------------------------------------------------------------------------------------------------------  Chemistries   Recent Labs Lab 05/30/15 1437 05/31/15 0023  NA 136 137  K 3.8 4.3  CL 99* 102  CO2 32 31  GLUCOSE 146* 100*  BUN 22* 24*  CREATININE 1.05 1.01  CALCIUM 8.0* 7.6*  MG  --  1.7  AST 27  --   ALT 18  --   ALKPHOS 94  --   BILITOT 0.8  --    ------------------------------------------------------------------------------------------------------------------  Cardiac Enzymes  Recent Labs Lab 05/30/15 1437  TROPONINI <0.03   ------------------------------------------------------------------------------------------------------------------  RADIOLOGY:  No results found.   ASSESSMENT AND PLAN:   82 year old male with metastatic prostate cancer, recent GI bleed secondary to duodenal ulcer, chronic atrial fibrillation, hyperlipidemia, COPD who presents to the hospital due to rectal bleeding.  #1 GI bleed-suspected to be a lower GI bleed given his rectal bleeding. Patient was recently diagnosed with a duodenal ulcer although. -Status post 2 units of packed red blood cells and hemoglobin is stable and we will give 1 more unit today. -We'll follow hemoglobin. Continue Protonix twice daily. -Await further gastroenterology input. -Clear liquid diet for now.  #2 acute blood  loss anemia-secondary to the GI bleed. -Continue transfusions as needed. Follow hemoglobin.  #3 COPD-no  acute exacerbation. Continue Dulera, Spiriva. -Continue maintenance prednisone.  #4 history of chronic atrial fibrillation-currently rate controlled. -Hold metoprolol given relative hypotension. Hold aspirin given the GI bleed.  #5 hypotension-secondary to volume loss from the GI bleed. -Continue IV fluids, blood transfusions. Follow hemodynamics.  #6 history of CHF-clinically patient is not in congestive heart failure. Hold Lasix, beta blocker given the hypotension.   All the records are reviewed and case discussed with Care Management/Social Workerr. Management plans discussed with the patient, family and they are in agreement.  CODE STATUS: DO NOT RESUSCITATE  DVT Prophylaxis: Teds and SCDs  TOTAL TIME TAKING CARE OF THIS PATIENT: 30 minutes.   POSSIBLE D/C IN 2-3 DAYS, DEPENDING ON CLINICAL CONDITION.   Henreitta Leber M.D on 05/31/2015 at 3:40 PM  Between 7am to 6pm - Pager - [phone]  After 6pm go to [email or web address] - password [email or web address] [facility]  [place] [facility]  Office  [phone]  CC: Primary care physician; No primary care provider on file.

## 2015-05-31 NOTE — Progress Notes (Signed)
Blood started at 1903 remained with patient for 15 minutes. See flowsheet for vitals. Pt tolerating transfusion well. Continue to assess.

## 2015-05-31 NOTE — Consult Note (Signed)
GI Inpatient Consult Note  Reason for Consult: Rectal bleeding    Attending Requesting Consult: Dr. Bridgett Larsson  History of Present Illness: Chad Kirby is a 82 y.o. male seen for evaluation of anemia/rectal bleeding at the request of Dr. Bridgett Larsson.   PMHx of COPD, prostate cancer w/ bone mets s/p 1 infusion of chemo, HLD, atrial fib on coumadin. Admitted 3/12 to 05/23/2015 for GI bleed as below. At that admission INR was 4, Hgb dropped to 7.4. Was given FFP to reverse INR. Had EGD/colonoscopy as below. He was discharged with Hgb 8.0 on 3/20. Coumadin was held. Discharged on ASA 81mg .    He was readmitted following large BM with gross blood. hgb 7.3 on admission. Family at bedside who helps w/ history. Reports he was doing fairly well since his hospital discharge until yesterday morning. Appetite was better and having a soft brown stool without gross blood. Yesterday morning felt light headed during PT and had large bloody stool-unsure of color. He presented to ER and found to have Hgb 7.3, he has received 2 units PRBC. Repeat CBC pending. He denies any abd pain. Had clear liquid lunch tray. Denies any additional NSAIDs except baby aspirin.     Last Colonoscopy: 05/17/15 medium polyp ascending colon, small polyp descending colon, large polyp rectosigmoid, blood everywhere from rectum to cecum.   DIAGNOSIS:  A. COLON POLYP, ASCENDING; HOT SNARE:  - FECAL MATERIAL.  - COLONIC MUCOSA IS NOT IDENTIFIED.   B. COLON POLYP, DESCENDING; HOT SNARE:  - PREDOMINANTLY FECAL MATERIAL.  - SMALL FRAGMENT OF MODERATELY CAUTERIZED COLONIC MUCOSA WITH FEATURES  OF A FIBROBLASTIC POLYP.  - NEGATIVE FOR DYSPLASIA AND MALIGNANCY.   C. COLON POLYP, RECTOSIGMOID; HOT SNARE:  - TUBULOVILLOUS ADENOMA.  - NEGATIVE FOR HIGH-GRADE DYSPLASIA AND MALIGNANCY.  Last Endoscopy: Dr. Candace Cruise - 05/19/15 - normal esophagus, gastritis, one oozing cratered duodenal ulcer w/ visible vessel at duodenal bulb, coagulation used for  hemostasis. Unable to get scope around ulcer to reach distal duodenum.    Past Medical History:  Past Medical History  Diagnosis Date  . COPD (chronic obstructive pulmonary disease) ([place])   . Cancer [facility])     Prostate with bone mets-current chemo treatment  . Hyperlipidemia   . Atrial fibrillation [facility])     Problem List: Patient Active Problem List   Diagnosis Date Noted  . GIB (gastrointestinal bleeding) 05/30/2015  . Anemia 05/30/2015  . Lower GI bleed 05/15/2015  . Cellulitis of left lower extremity   . Atrial fibrillation with RVR ([place]) 05/01/2015  . Sepsis ([place]) 05/01/2015  . Cellulitis and abscess of leg 05/01/2015    Past Surgical History: Past Surgical History  Procedure Laterality Date  . Replacement total knee Right   . Colonoscopy with propofol N/A 05/18/2015    Procedure: COLONOSCOPY WITH PROPOFOL;  Surgeon: Hulen Luster, MD;  [facility] ENDOSCOPY;  Service: Endoscopy;  Laterality: N/A;  . Esophagogastroduodenoscopy N/A 05/19/2015    Procedure: ESOPHAGOGASTRODUODENOSCOPY (EGD);  Surgeon: Hulen Luster, MD;  Location[facility] ENDOSCOPY;  Service: Endoscopy;  Laterality: N/A;    Allergies: Allergies  Allergen Reactions  . Latex Rash    Home Medications: Prescriptions prior to admission  Medication Sig Dispense Refill Last Dose  . budesonide-formoterol (SYMBICORT) 80-4.5 MCG/ACT inhaler Inhale 2 puffs into the lungs 2 (two) times daily.   05/30/2015 at am  . collagenase (SANTYL) ointment Apply 1 application topically 2 (two) times daily. Apply a thick layer to posterior left lower leg, cover with NS  damp to dry gauze, wrap with kerlix   05/30/2015 at am  . furosemide (LASIX) 20 MG tablet Take 20 mg by mouth 2 (two) times daily.    05/30/2015 at am  . gabapentin (NEURONTIN) 300 MG capsule Take 1 capsule (300 mg total) by mouth 2 (two) times daily. 60 capsule 0 05/30/2015 at am  . Leuprolide Acetate, 6 Month, (LUPRON) 45 MG injection Inject 45 mg into the muscle every 6 (six)  months.   04/22/2015  . LORazepam (ATIVAN) 0.5 MG tablet Take 0.25 mg by mouth every 4 (four) hours as needed for anxiety.   prn at prn  . metoprolol succinate (TOPROL-XL) 25 MG 24 hr tablet Take 25 mg by mouth daily.   05/30/2015 at am   . morphine 20 MG/5ML solution Take 1-2 mg by mouth every hour as needed for pain.   prn at prn  . oxyCODONE (OXY IR/ROXICODONE) 5 MG immediate release tablet Take 1 tablet (5 mg total) by mouth every 4 (four) hours as needed for moderate pain. 30 tablet 0 prn at prn  . pantoprazole (PROTONIX) 40 MG tablet Take 1 tablet (40 mg total) by mouth 2 (two) times daily. 60 tablet 0 05/30/2015 at am  . polyethylene glycol (MIRALAX / GLYCOLAX) packet Take 17 g by mouth 2 (two) times daily. 14 each 0 05/30/2015 at am  . potassium chloride SA (K-DUR,KLOR-CON) 20 MEQ tablet Take 40 mEq by mouth daily.   05/30/2015 at am  . predniSONE (DELTASONE) 10 MG tablet Take 10 mg by mouth daily.  9 05/30/2015 at am  . prochlorperazine (COMPAZINE) 10 MG tablet Take 10 mg by mouth every 6 (six) hours as needed. For nausea and vomiting.   prn at prn  . senna-docusate (SENOKOT-S) 8.6-50 MG tablet Take 2 tablets by mouth 2 (two) times daily.   05/30/2015 at am  . sodium chloride (OCEAN) 0.65 % SOLN nasal spray Place 1 spray into both nostrils as needed for congestion.   prn at prn  . tiotropium (SPIRIVA) 18 MCG inhalation capsule Place 18 mcg into inhaler and inhale daily.   05/30/2015 at am  . traZODone (DESYREL) 50 MG tablet Take 50 mg by mouth at bedtime as needed for sleep.   prn at prn   Home medication reconciliation was completed with the patient.   Scheduled Inpatient Medications:   . sodium chloride  10 mL/hr Intravenous Once  . sodium chloride   Intravenous Once  . collagenase  1 application Topical BID  . gabapentin  300 mg Oral BID  . [START ON 10/12/2015] Leuprolide Acetate (6 Month)  45 mg Intramuscular Q6 months  . mometasone-formoterol  2 puff Inhalation BID  . potassium chloride  SA  40 mEq Oral Daily  . predniSONE  10 mg Oral Daily  . tiotropium  18 mcg Inhalation Daily    Continuous Inpatient Infusions:   . 0.9 % NaCl with KCl 20 mEq / L 75 mL/hr at 05/31/15 0959    PRN Inpatient Medications:  acetaminophen **OR** acetaminophen, albuterol, LORazepam, ondansetron **OR** ondansetron (ZOFRAN) IV, sodium chloride, traZODone  Family History: family history includes Brain cancer in his sister; Stroke in his father.    Social History:   reports that he has quit smoking. He does not have any smokeless tobacco history on file. He reports that he drinks alcohol.    Review of Systems: Constitutional: Weight is stable.  Eyes: No changes in vision. ENT: No oral lesions, sore throat.  GI: see HPI.  Heme/Lymph: No easy bruising.  CV: No chest pain.  GU: No hematuria.  Integumentary: No rashes.  Neuro: No headaches.  Psych: No depression/anxiety.  Endocrine: No heat/cold intolerance.  Allergic/Immunologic: No urticaria.  Resp: No cough, SOB.  Musculoskeletal: No joint swelling.    Physical Examination: BP 89/52 mmHg  Pulse 103  Temp(Src) 98.2 F (36.8 C) (Oral)  Resp 20  Ht 6\' 1"  (1.854 m)  Wt 230 lb (104.327 kg)  BMI 30.35 kg/m2  SpO2 98% Gen: NAD, alert and oriented x 4 HEENT: PEERLA, EOMI, Neck: supple, no JVD or thyromegaly Chest: CTA bilaterally, no wheezes, crackles, or other adventitious sounds.  CV: regular rhythm, Tachycardic, no m/g/c/r Abd: soft, NT, ND, +BS in all four quadrants; no HSM, guarding, ridigity, or rebound tenderness Rectal-maroon stool in rectal vault.  Ext: no edema, well perfused with 2+ pulses, Skin: wound on posterior leg noted.  Lymph: no LAD  Data: Lab Results  Component Value Date   WBC 6.7 05/31/2015   HGB 7.6* 05/31/2015   HGB 7.5* 05/31/2015   HCT 22.8* 05/31/2015   HCT 22.5* 05/31/2015   MCV 96.5 05/31/2015   PLT 128* 05/31/2015    Recent Labs Lab 05/30/15 1437 05/31/15 0023  HGB 7.3* 7.6*  7.5*    Lab Results  Component Value Date   NA 137 05/31/2015   K 4.3 05/31/2015   CL 102 05/31/2015   CO2 31 05/31/2015   BUN 24* 05/31/2015   CREATININE 1.01 05/31/2015   Lab Results  Component Value Date   ALT 18 05/30/2015   AST 27 05/30/2015   ALKPHOS 94 05/30/2015   BILITOT 0.8 05/30/2015    Recent Labs Lab 05/30/15 1437  INR 1.09   Assessment/Plan: Mr. Nattress is a 82 y.o. male recently admitted for GI bleed found to have large duodenal ulcer, discharged, now admitted for recurrent bleeding.   1. GI bleed - need ASAP repeat CBC after 2nd unit. Elevated BUN Cr ratio consistent w/ UGI bleed. Know history of large duodenal ulcer. Per family has been on PPI since d/c but still source of repeated bleed. Currently tachycardiac in a fib and hypotensive. Needs stabilization and will plan for repeat EGD tomorrow afternoon. Clears until midnight. Will re-start protonix gtt.   Recommendations:  1. EGD tomorrow afternoon 2. Clear liquids  3. NPO at midnight 4. Protonix gtt 5. Hold ASA products.  6. STAT CBC.    Case discussed w/ Dr. Gustavo Lah.   Thank you for the consult. Please call with questions or concerns.  Ronney Asters, PA-C [place]

## 2015-05-31 NOTE — Consult Note (Signed)
Please see full GI consult by Ms Constance Haw.  Patietn seen and examined, chart reviewed.  Patient admitted with rectal bleeding in the setting of  Finding of bleeding duodenal ulcer on 05/19/15 treated at that time with cautery.   Patient was restarted on ASA prior to his last d/c.   Patient has had no repeat bleeding since this am, although recheck of HGB this evening shows a drop from 7.5 to 6.2 since this am.  Blood had been ordered for tfx, but not yet done.   Patietn denies abdominal pain or nausea.  He is currently on iv bid ppi.  BP has been low today with tachycardia (note a-fib).  Will plan EGD repeat for tomorrow PM.  Continue serial hgb, tfx as needed, continue iv ppi.   Consider transfer to ICU for repeat bleeding.

## 2015-05-31 NOTE — Progress Notes (Signed)
Notified Dr. Donnella Sham and prime doc about pt hbg result of 6.2 at 1742 and low blood pressure.  Dr. Posey Pronto suggested to continue to monitor on 2C floor for now, rather than transferring to ICU.  Dr. Donnella Sham ordered to have the 1 unit of blood given and follow with a H&H 1 hour after the unit is complete.  If the HGB is not greater thatn 7.7 then an additional 1 unit of blood should be ordered and given followed by another H&H. Pt resting in bed, blood transfusing. Continue to assess.

## 2015-05-31 NOTE — Clinical Social Work Note (Signed)
Clinical Social Work Assessment  Patient Details  Name: Chad Kirby MRN: [id number] Date of Birth: [birth year]  Date of referral:  05/31/15               Reason for consult:  Facility Placement                Permission sought to share information with:  Facility Sport and exercise psychologist, Family Supports Permission granted to share information::  Yes, Verbal Permission Granted  Name::        Agency::     Relationship::     Contact Information:     Housing/Transportation Living arrangements for the past 2 months:  [place], [place] of Information:  Spouse, Adult Children Patient Interpreter Needed:  None Criminal Activity/Legal Involvement Pertinent to Current Situation/Hospitalization:  No - Comment as needed Significant Relationships:  Spouse, Adult Children Lives with:  Facility Resident, Spouse Do you feel safe going back to the place where you live?    Need for family participation in patient care:  Yes (Comment)  Care giving concerns:  Patient has been at [facility] for short term rehab.    Social Worker assessment / plan:  CSW met with patient, patient's wife , and his youngest daughter. Daughter had multiple complaints regarding care at [facility] and patient's wife is requesting a new bedsearch but does not want to give bed away at [facility]. [employer] reauth will have to be obtained prior to patient returning to a SNF. Patient will require physical therapy to see patient every 48 hours while patient is here as this is a [employer] requirement in order to provide prior auth for patient going to SNF.   Employment status:  Retired Nurse, adult PT Recommendations:  Not assessed at this time Information / Referral to community resources:     Patient/Family's Response to care:  Patient's wife and daughter expressed appreciation for CSW assistance.  Patient/Family's Understanding of and Emotional Response to  Diagnosis, Current Treatment, and Prognosis:  Patient's family has expressed  frustration with care at current STR facility.  Emotional Assessment Appearance:  Appears stated age Attitude/Demeanor/Rapport:   (quiet and solumn) Affect (typically observed):  Calm, Quiet Orientation:  Oriented to Self, Oriented to Place Alcohol / Substance use:  Not Applicable Psych involvement (Current and /or in the community):  No (Comment)  Discharge Needs  Concerns to be addressed:  Care Coordination Readmission within the last 30 days:  No Current discharge risk:  None Barriers to Discharge:  No Barriers Identified   Shela Leff, LCSW 05/31/2015, 4:07 PM

## 2015-06-01 ENCOUNTER — Encounter
Admission: EM | Disposition: A | Discharge status: Skilled Nursing Facility | Payer: Self-pay | Source: Home / Self Care | Attending: Internal Medicine

## 2015-06-01 ENCOUNTER — Inpatient Hospital Stay: Payer: Medicare Other | Admitting: Anesthesiology

## 2015-06-01 LAB — BASIC METABOLIC PANEL
ANION GAP: 1 — AB (ref 5–15)
BUN: 28 mg/dL — AB (ref 6–20)
CALCIUM: 6.9 mg/dL — AB (ref 8.9–10.3)
CO2: 30 mmol/L (ref 22–32)
Chloride: 108 mmol/L (ref 101–111)
Creatinine, Ser: 0.86 mg/dL (ref 0.61–1.24)
GFR calc Af Amer: >60 mL/min (ref >60)
GLUCOSE: 82 mg/dL (ref 65–99)
Potassium: 3.3 mmol/L — ABNORMAL LOW (ref 3.5–5.1)
Sodium: 139 mmol/L (ref 135–145)

## 2015-06-01 LAB — CBC
HCT: 23.2 % — ABNORMAL LOW (ref 40.0–52.0)
HEMATOCRIT: 21.4 % — AB (ref 40.0–52.0)
Hemoglobin: 7.3 g/dL — ABNORMAL LOW (ref 13.0–18.0)
Hemoglobin: 7.8 g/dL — ABNORMAL LOW (ref 13.0–18.0)
MCH: 30.8 pg (ref 26.0–34.0)
MCH: 30.7 pg (ref 26.0–34.0)
MCHC: 34 g/dL (ref 32.0–36.0)
MCHC: 33.5 g/dL (ref 32.0–36.0)
MCV: 90.6 fL (ref 80.0–100.0)
MCV: 91.9 fL (ref 80.0–100.0)
PLATELETS: 103 10*3/uL — AB (ref 150–440)
Platelets: 107 10*3/uL — ABNORMAL LOW (ref 150–440)
RBC: 2.36 MIL/uL — ABNORMAL LOW (ref 4.40–5.90)
RBC: 2.52 MIL/uL — ABNORMAL LOW (ref 4.40–5.90)
RDW: 21 % — AB (ref 11.5–14.5)
RDW: 20.6 % — AB (ref 11.5–14.5)
WBC: 8 10*3/uL (ref 3.8–10.6)
WBC: 7.9 10*3/uL (ref 3.8–10.6)

## 2015-06-01 LAB — HEMOGLOBIN: Hemoglobin: 7.3 g/dL — ABNORMAL LOW (ref 13.0–18.0)

## 2015-06-01 LAB — PROTIME-INR
INR: 1.12
Prothrombin Time: 14.6 seconds (ref 11.4–15.0)

## 2015-06-01 SURGERY — EGD (ESOPHAGOGASTRODUODENOSCOPY)
Anesthesia: Monitor Anesthesia Care

## 2015-06-01 MED ORDER — SODIUM CHLORIDE 0.9 % IV SOLN
Freq: Once | INTRAVENOUS | Status: AC
  Administered 2015-06-01: 17:00:00 via INTRAVENOUS

## 2015-06-01 MED ORDER — ACETAMINOPHEN 10 MG/ML IV SOLN
1000.0000 mg | Freq: Once | INTRAVENOUS | Status: AC
  Administered 2015-06-01: 1000 mg via INTRAVENOUS
  Filled 2015-06-01: qty 100

## 2015-06-01 MED ORDER — SODIUM CHLORIDE 0.9 % IV SOLN: INTRAVENOUS | Status: DC

## 2015-06-01 NOTE — Consult Note (Signed)
Subjective: Patient seen for GI bleeding.  One bm last night, one this am, smear this afternoon.  Denies abdominal pain or nausea, no emesis. Reported intermittant tachypnea over the last 2 days.  Patient report he regularly gets short of breath, and that the current shortness of breath is not unusual .   Objective: Vital signs in last 24 hours: Temp:  [97.6 F (36.4 C)-99.2 F (37.3 C)] 98 F (36.7 C) (03/29 1500) Pulse Rate:  [120-130] 124 (03/29 1301) Resp:  [18-30] 30 (03/29 1301) BP: (89-115)/(50-64) 103/64 mmHg (03/29 1500) SpO2:  [97 %-100 %] 97 % (03/29 1301) Blood pressure 103/64, pulse 124, temperature 98 F (36.7 C), temperature source Oral, resp. rate 30, height 6\' 1"  (1.854 m), weight 104.327 kg (230 lb), SpO2 97 %.   Intake/Output from previous day: 03/28 0701 - 03/29 0700 In: 2693 [P.O.:750; I.V.:1113; Blood:830] Out: 950 [Urine:950]  Intake/Output this shift: Total I/O In: 730.5 [I.V.:730.5] Out: 425 [Urine:425]   General appearance:  Elderly, no distress Resp:  CTA Cardio:  IRR GI:  Soft, protuberant, nontender, bowel sounds positive/normoactive.  Extremities:   2-3 plus lee, marked stasis dermatitis   Lab Results: Results for orders placed or performed during the hospital encounter of 05/30/15 (from the past 24 hour(s))  CBC with Differential/Platelet     Status: Abnormal   Collection Time: 05/31/15  5:42 PM  Result Value Ref Range   WBC 8.1 3.8 - 10.6 K/uL   RBC 1.91 (L) 4.40 - 5.90 MIL/uL   Hemoglobin 6.2 (L) 13.0 - 18.0 g/dL   HCT 18.5 (L) 40.0 - 52.0 %   MCV 97.0 80.0 - 100.0 fL   MCH 32.6 26.0 - 34.0 pg   MCHC 33.6 32.0 - 36.0 g/dL   RDW 22.0 (H) 11.5 - 14.5 %   Platelets 119 (L) 150 - 440 K/uL   Neutrophils Relative % 86 %   Neutro Abs 7.0 (H) 1.4 - 6.5 K/uL   Lymphocytes Relative 6 %   Lymphs Abs 0.4 (L) 1.0 - 3.6 K/uL   Monocytes Relative 6 %   Monocytes Absolute 0.5 0.2 - 1.0 K/uL   Eosinophils Relative 1 %   Eosinophils Absolute 0.1 0  - 0.7 K/uL   Basophils Relative 1 %   Basophils Absolute 0.1 0 - 0.1 K/uL  Prepare RBC     Status: None   Collection Time: 05/31/15  8:00 PM  Result Value Ref Range   Order Confirmation ORDER PROCESSED BY BLOOD BANK   Hemoglobin and hematocrit, blood     Status: Abnormal   Collection Time: 05/31/15 10:52 PM  Result Value Ref Range   Hemoglobin 6.4 (L) 13.0 - 18.0 g/dL   HCT 19.1 (L) 40.0 - 52.0 %  CBC     Status: Abnormal   Collection Time: 06/01/15  4:33 AM  Result Value Ref Range   WBC 8.0 3.8 - 10.6 K/uL   RBC 2.36 (L) 4.40 - 5.90 MIL/uL   Hemoglobin 7.3 (L) 13.0 - 18.0 g/dL   HCT 21.4 (L) 40.0 - 52.0 %   MCV 90.6 80.0 - 100.0 fL   MCH 30.8 26.0 - 34.0 pg   MCHC 34.0 32.0 - 36.0 g/dL   RDW 21.0 (H) 11.5 - 14.5 %   Platelets 103 (L) 150 - 440 K/uL  Basic metabolic panel     Status: Abnormal   Collection Time: 06/01/15  4:33 AM  Result Value Ref Range   Sodium 139 135 - 145 mmol/L  Potassium 3.3 (L) 3.5 - 5.1 mmol/L   Chloride 108 101 - 111 mmol/L   CO2 30 22 - 32 mmol/L   Glucose, Bld 82 65 - 99 mg/dL   BUN 28 (H) 6 - 20 mg/dL   Creatinine, Ser 0.86 0.61 - 1.24 mg/dL   Calcium 6.9 (L) 8.9 - 10.3 mg/dL   GFR calc non Af Amer >60 >60 mL/min   GFR calc Af Amer >60 >60 mL/min   Anion gap 1 (L) 5 - 15  Hemoglobin     Status: Abnormal   Collection Time: 06/01/15 12:37 PM  Result Value Ref Range   Hemoglobin 7.3 (L) 13.0 - 18.0 g/dL  Protime-INR     Status: None   Collection Time: 06/01/15 12:37 PM  Result Value Ref Range   Prothrombin Time 14.6 11.4 - 15.0 seconds   INR 1.12   Prepare RBC     Status: None   Collection Time: 06/01/15  3:09 PM  Result Value Ref Range   Order Confirmation ORDER PROCESSED BY BLOOD BANK       Recent Labs  05/31/15 0023 05/31/15 1742 05/31/15 2252 06/01/15 0433 06/01/15 1237  WBC 6.7 8.1  --  8.0  --   HGB 7.6*  7.5* 6.2* 6.4* 7.3* 7.3*  HCT 22.8*  22.5* 18.5* 19.1* 21.4*  --   PLT 128* 119*  --  103*  --    BMET  Recent  Labs  05/30/15 1437 05/31/15 0023 06/01/15 0433  NA 136 137 139  K 3.8 4.3 3.3*  CL 99* 102 108  CO2 32 31 30  GLUCOSE 146* 100* 82  BUN 22* 24* 28*  CREATININE 1.05 1.01 0.86  CALCIUM 8.0* 7.6* 6.9*   LFT  Recent Labs  05/30/15 1437  PROT 4.5*  ALBUMIN 2.0*  AST 27  ALT 18  ALKPHOS 94  BILITOT 0.8   PT/INR  Recent Labs  05/30/15 1437 06/01/15 1237  LABPROT 14.3 14.6  INR 1.09 1.12   Hepatitis Panel No results for input(s): HEPBSAG, HCVAB, HEPAIGM, HEPBIGM in the last 72 hours. C-Diff No results for input(s): CDIFFTOX in the last 72 hours. No results for input(s): CDIFFPCR in the last 72 hours.   Studies/Results: No results found.  Scheduled Inpatient Medications:   . sodium chloride  10 mL/hr Intravenous Once  . sodium chloride   Intravenous Once  . collagenase  1 application Topical BID  . gabapentin  300 mg Oral BID  . [START ON 10/12/2015] Leuprolide Acetate (6 Month)  45 mg Intramuscular Q6 months  . mometasone-formoterol  2 puff Inhalation BID  . predniSONE  10 mg Oral Daily  . tiotropium  18 mcg Inhalation Daily    Continuous Inpatient Infusions:   . sodium chloride    . 0.9 % NaCl with KCl 20 mEq / L 75 mL/hr at 06/01/15 0314  . pantoprozole (PROTONIX) infusion 8 mg/hr (06/01/15 1213)    PRN Inpatient Medications:  acetaminophen **OR** acetaminophen, albuterol, LORazepam, ondansetron **OR** ondansetron (ZOFRAN) IV, sodium chloride, traZODone  Miscellaneous:   Assessment:  1) GI bleeding-patient with low bp, less that expected response to transfusion, 2 bm last 24 hours-previously maroon, now  Thin,watery black. Tachycardia in the setting of a fib. On PPI drip.  Plan:  1) case discussed with Anesthesia and IM.  continue ppi drip, agree with  Tfx, recheck hgb after transfusion.  With current low bp,  And tachycardia there is concern for sedation complication in procedure.  Agree with  resucitation as discussed with Dr Bridgett Larsson.  EGD when  clinically feasible.   Lollie Sails MD 06/01/2015, 3:41 PM

## 2015-06-01 NOTE — Progress Notes (Signed)
PT Cancellation Note  Patient Details Name: Chad Kirby MRN: [id number] DOB: January 18, 1930   Cancelled Treatment:    Reason Eval/Treat Not Completed: Medical issues which prohibited therapy. Patient currently bathing with RN staff, on bedrest orders. He will be receiving 4th unit of blood shortly (on this admission). RN staff asked PT to defer mobility evaluation for later time/date.   Kerman Passey, PT, DPT    06/01/2015, 4:27 PM

## 2015-06-01 NOTE — Progress Notes (Signed)
[place] at [place] NAME: Chad Kirby    MR#:  [id number]  DATE OF BIRTH:  1929/09/07  SUBJECTIVE:   Patient here due to rectal bleeding and likely a lower GI bleed.   No bleeding today but weakness.   REVIEW OF SYSTEMS:    Review of Systems  Constitutional: Negative for fever and chills.  HENT: Negative for congestion and tinnitus.   Eyes: Negative for blurred vision and double vision.  Respiratory: Negative for cough, shortness of breath and wheezing.   Cardiovascular: Negative for chest pain, orthopnea and PND.  Gastrointestinal: No blood in stool. Negative for nausea, vomiting, abdominal pain and diarrhea.  Genitourinary: Negative for dysuria and hematuria.  Neurological: Positive for weakness (generalized). Negative for dizziness, sensory change and focal weakness.  All other systems reviewed and are negative.   DRUG ALLERGIES:   Allergies  Allergen Reactions  . Latex Rash    VITALS:  Blood pressure 103/64, pulse 124, temperature 98 F (36.7 C), temperature source Oral, resp. rate 30, height 6\' 1"  (1.854 m), weight 104.327 kg (230 lb), SpO2 97 %.  PHYSICAL EXAMINATION:   Physical Exam  GENERAL:  82 y.o.-year-old pale appearing patient lying in the bed with no acute distress.  EYES: Pupils equal, round, reactive to light and accommodation. No scleral icterus. Pale conjunctiva Extraocular muscles intact.  HEENT: Head atraumatic, normocephalic. Oropharynx and nasopharynx clear. Dry oral mucosa NECK:  Supple, no jugular venous distention. No thyroid enlargement, no tenderness.  LUNGS: Normal breath sounds bilaterally, no wheezing, rales, rhonchi. No use of accessory muscles of respiration.  CARDIOVASCULAR: S1, S2 irregular and tachycardia. No murmurs, rubs, or gallops.  ABDOMEN: Soft, nontender, nondistended. Bowel sounds present. No organomegaly or mass.  EXTREMITIES: No cyanosis, clubbing or edema b/l.     NEUROLOGIC: Cranial nerves II through XII are intact. No focal Motor or sensory deficits b/l. Globally weak  PSYCHIATRIC: The patient is alert and oriented x 3.  SKIN: No obvious rash, lesion, or ulcer.    LABORATORY PANEL:   CBC  Recent Labs Lab 06/01/15 0433 06/01/15 1237  WBC 8.0  --   HGB 7.3* 7.3*  HCT 21.4*  --   PLT 103*  --    ------------------------------------------------------------------------------------------------------------------  Chemistries   Recent Labs Lab 05/30/15 1437 05/31/15 0023 06/01/15 0433  NA 136 137 139  K 3.8 4.3 3.3*  CL 99* 102 108  CO2 32 31 30  GLUCOSE 146* 100* 82  BUN 22* 24* 28*  CREATININE 1.05 1.01 0.86  CALCIUM 8.0* 7.6* 6.9*  MG  --  1.7  --   AST 27  --   --   ALT 18  --   --   ALKPHOS 94  --   --   BILITOT 0.8  --   --    ------------------------------------------------------------------------------------------------------------------  Cardiac Enzymes  Recent Labs Lab 05/30/15 1437  TROPONINI <0.03   ------------------------------------------------------------------------------------------------------------------  RADIOLOGY:  No results found.   ASSESSMENT AND PLAN:   82 year old male with metastatic prostate cancer, recent GI bleed secondary to duodenal ulcer, chronic atrial fibrillation, hyperlipidemia, COPD who presents to the hospital due to rectal bleeding.  #1 GI bleed-suspected to be a lower GI bleed given his rectal bleeding. Patient was recently diagnosed with a duodenal ulcer although. -Status post 3 units of packed red blood cells and hemoglobin is 7.3 today.  Continue Protonix twice daily. Give 1 more unit of PRBC due to tachycardia and low side  BP per Dr. Gustavo Lah. F/u Hb.  #2 acute blood loss anemia-secondary to the GI bleed. -Continue transfusions as needed. Follow hemoglobin.  #3 COPD-no acute exacerbation. Continue Dulera, Spiriva. -Continue maintenance prednisone.  #4 history of  chronic atrial fibrillation-currently rate uncontrolled.  -Hold metoprolol given relative hypotension. Hold aspirin given the GI bleed. Give 1 more unit of PRBC due to tachycardia and low side BP per Dr. Gustavo Lah  #5 hypotension-secondary to volume loss from the GI bleed. -Continue IV fluids, blood transfusions.  #6 history of CHF-clinically patient is not in congestive heart failure. Hold Lasix, beta blocker given the hypotension.  *Hypokalemia. Continue NS with KCl. F/u BMP.   Discussed with Dr. Gustavo Lah. All the records are reviewed and case discussed with Care Management/Social Workerr. Management plans discussed with the patient, his wife and daughter and they are in agreement.  CODE STATUS: DO NOT RESUSCITATE  DVT Prophylaxis: Teds and SCDs  TOTAL TIME TAKING CARE OF THIS PATIENT: 42 minutes.   POSSIBLE D/C IN 2-3 DAYS, DEPENDING ON CLINICAL CONDITION.   Demetrios Loll M.D on 06/01/2015 at 3:17 PM  Between 7am to 6pm - Pager - [phone]  After 6pm go to [email or web address] - password [email or web address] [facility]  [facility]  Office  [phone]  CC: Primary care physician; No primary care provider on file.

## 2015-06-02 ENCOUNTER — Encounter: Payer: Self-pay | Admitting: *Deleted

## 2015-06-02 ENCOUNTER — Inpatient Hospital Stay: Payer: Medicare Other | Admitting: Anesthesiology

## 2015-06-02 ENCOUNTER — Encounter
Admission: EM | Disposition: A | Discharge status: Skilled Nursing Facility | Payer: Self-pay | Source: Home / Self Care | Attending: Internal Medicine

## 2015-06-02 HISTORY — PX: ESOPHAGOGASTRODUODENOSCOPY (EGD) WITH PROPOFOL: SHX5813

## 2015-06-02 LAB — BASIC METABOLIC PANEL
Anion gap: 2 — ABNORMAL LOW (ref 5–15)
BUN: 24 mg/dL — AB (ref 6–20)
CHLORIDE: 115 mmol/L — AB (ref 101–111)
CO2: 23 mmol/L (ref 22–32)
CREATININE: 0.81 mg/dL (ref 0.61–1.24)
Calcium: 6.4 mg/dL — CL (ref 8.9–10.3)
GFR calc Af Amer: >60 mL/min (ref >60)
Glucose, Bld: 107 mg/dL — ABNORMAL HIGH (ref 65–99)
Potassium: 4.6 mmol/L (ref 3.5–5.1)
SODIUM: 140 mmol/L (ref 135–145)

## 2015-06-02 LAB — CBC
HCT: 24.1 % — ABNORMAL LOW (ref 40.0–52.0)
HCT: 25.9 % — ABNORMAL LOW (ref 40.0–52.0)
HEMATOCRIT: 25.3 % — AB (ref 40.0–52.0)
HEMOGLOBIN: 8.5 g/dL — AB (ref 13.0–18.0)
Hemoglobin: 8.2 g/dL — ABNORMAL LOW (ref 13.0–18.0)
Hemoglobin: 8.6 g/dL — ABNORMAL LOW (ref 13.0–18.0)
MCH: 31 pg (ref 26.0–34.0)
MCH: 31.2 pg (ref 26.0–34.0)
MCH: 31.5 pg (ref 26.0–34.0)
MCHC: 34 g/dL (ref 32.0–36.0)
MCHC: 33.3 g/dL (ref 32.0–36.0)
MCHC: 33.7 g/dL (ref 32.0–36.0)
MCV: 91.2 fL (ref 80.0–100.0)
MCV: 93.7 fL (ref 80.0–100.0)
MCV: 93.4 fL (ref 80.0–100.0)
PLATELETS: 105 10*3/uL — AB (ref 150–440)
Platelets: 114 10*3/uL — ABNORMAL LOW (ref 150–440)
Platelets: 124 10*3/uL — ABNORMAL LOW (ref 150–440)
RBC: 2.64 MIL/uL — ABNORMAL LOW (ref 4.40–5.90)
RBC: 2.77 MIL/uL — AB (ref 4.40–5.90)
RBC: 2.71 MIL/uL — ABNORMAL LOW (ref 4.40–5.90)
RDW: 19.2 % — AB (ref 11.5–14.5)
RDW: 19.7 % — ABNORMAL HIGH (ref 11.5–14.5)
RDW: 20 % — AB (ref 11.5–14.5)
WBC: 8.2 10*3/uL (ref 3.8–10.6)
WBC: 8.9 10*3/uL (ref 3.8–10.6)
WBC: 7.6 10*3/uL (ref 3.8–10.6)

## 2015-06-02 LAB — MAGNESIUM: Magnesium: 1.6 mg/dL — ABNORMAL LOW (ref 1.7–2.4)

## 2015-06-02 LAB — PREPARE RBC (CROSSMATCH)

## 2015-06-02 LAB — GLUCOSE, CAPILLARY: GLUCOSE-CAPILLARY: 97 mg/dL (ref 65–99)

## 2015-06-02 SURGERY — ESOPHAGOGASTRODUODENOSCOPY (EGD) WITH PROPOFOL
Anesthesia: General

## 2015-06-02 MED ORDER — LIDOCAINE HCL (CARDIAC) 20 MG/ML IV SOLN
INTRAVENOUS | Status: DC | PRN
  Administered 2015-06-02: 100 mg via INTRAVENOUS

## 2015-06-02 MED ORDER — CETYLPYRIDINIUM CHLORIDE 0.05 % MT LIQD
7.0000 mL | Freq: Two times a day (BID) | OROMUCOSAL | Status: DC
  Administered 2015-06-02 – 2015-06-08 (×6): 7 mL via OROMUCOSAL

## 2015-06-02 MED ORDER — PROPOFOL 500 MG/50ML IV EMUL
INTRAVENOUS | Status: DC | PRN
  Administered 2015-06-02: 75 ug/kg/min via INTRAVENOUS

## 2015-06-02 MED ORDER — SODIUM CHLORIDE 0.9 % IV SOLN: Freq: Once | INTRAVENOUS | Status: DC

## 2015-06-02 MED ORDER — METOPROLOL TARTRATE 1 MG/ML IV SOLN
2.5000 mg | Freq: Four times a day (QID) | INTRAVENOUS | Status: DC
  Administered 2015-06-02 – 2015-06-05 (×10): 2.5 mg via INTRAVENOUS
  Filled 2015-06-02 (×10): qty 5

## 2015-06-02 MED ORDER — DILTIAZEM HCL 100 MG IV SOLR
5.0000 mg/h | INTRAVENOUS | Status: DC
  Administered 2015-06-02: 5 mg/h via INTRAVENOUS
  Administered 2015-06-02: 15 mg/h via INTRAVENOUS
  Administered 2015-06-04 – 2015-06-05 (×3): 10 mg/h via INTRAVENOUS
  Filled 2015-06-02 (×9): qty 100

## 2015-06-02 MED ORDER — GLYCOPYRROLATE 0.2 MG/ML IJ SOLN
INTRAMUSCULAR | Status: DC | PRN
  Administered 2015-06-02: 0.2 mg via INTRAVENOUS

## 2015-06-02 MED ORDER — MAGNESIUM SULFATE 2 GM/50ML IV SOLN
2.0000 g | Freq: Once | INTRAVENOUS | Status: AC
Start: 2015-06-02 — End: 2015-06-02
  Administered 2015-06-02: 2 g via INTRAVENOUS
  Filled 2015-06-02: qty 50

## 2015-06-02 MED ORDER — PROPOFOL 10 MG/ML IV BOLUS
INTRAVENOUS | Status: DC | PRN
  Administered 2015-06-02: 50 mg via INTRAVENOUS

## 2015-06-02 MED ORDER — PHENYLEPHRINE HCL 10 MG/ML IJ SOLN
INTRAMUSCULAR | Status: DC | PRN
  Administered 2015-06-02 (×2): 200 ug via INTRAVENOUS

## 2015-06-02 MED ORDER — DILTIAZEM LOAD VIA INFUSION
10.0000 mg | Freq: Once | INTRAVENOUS | Status: AC
  Administered 2015-06-02: 10 mg via INTRAVENOUS
  Filled 2015-06-02: qty 10

## 2015-06-02 MED ORDER — CHLORHEXIDINE GLUCONATE 0.12 % MT SOLN
15.0000 mL | Freq: Two times a day (BID) | OROMUCOSAL | Status: DC
  Administered 2015-06-02 – 2015-06-10 (×13): 15 mL via OROMUCOSAL
  Filled 2015-06-02 (×10): qty 15

## 2015-06-02 NOTE — Op Note (Signed)
[facility]enter[facility] Patient Name: Chad Kirby Procedure Date: 06/02/2015 4:14 PM MRN: [id number] Account #: [id number] Date of Birth: 07/07/1929 Admit Type: Inpatient Age: 83 Room: [facility] ENDO ROOM 4 Gender: Male Note Status: Finalized Procedure:            Upper GI endoscopy Indications:          [place] Providers:            Lollie Sails, MD Referring MD:         Wynona Canes. Kym Groom, MD (Referring MD) Medicines:            Monitored Anesthesia Care Complications:        No immediate complications. Procedure:            Pre-Anesthesia Assessment:                       - ASA Grade Assessment: IV - A patient with severe                        systemic disease that is a constant threat to life.                       After obtaining informed consent, the endoscope was                        passed under direct vision. Throughout the procedure,                        the patient's blood pressure, pulse, and oxygen                        saturations were monitored continuously. The Endoscope                        was introduced through the mouth, and advanced to the                        duodenal bulb. The upper GI endoscopy was accomplished                        without difficulty. The patient tolerated the procedure                        well. Findings:      The gastroesophageal junction was moderately tortuous.      The exam of the esophagus was otherwise normal.      Segmental moderate inflammation characterized by congestion (edema),       erythema and granularity was found in the gastric antrum, in the       prepyloric region of the stomach and at the pylorus.      One non-obstructing non-bleeding cratered duodenal ulcer with pigmented       material under yellow eschar was found in the duodenal bulb. The lesion       was 10 mm in largest dimension.      There was no active bleeding throughout.      The cardia and gastric fundus were normal on  retroflexion. Impression:           - Tortuous esophagus.                       -  Gastritis.                       - One non-obstructing non-bleeding duodenal ulcer with                        pigmented material.                       - No specimens collected. Recommendation:       - Return patient to ICU for ongoing care.                       - Give Protonix (pantoprazole): 8 mg/hr IV by                        continuous infusion.                       - May have ice chips.                       - Perform an H. pylori serology today. For recurrent                        bleeding recommend bleeding scan followed by possible                        colonoscopy. Procedure Code(s):    --- Professional ---                       [phone], Esophagogastroduodenoscopy, flexible, transoral;                        diagnostic, including collection of specimen(s) by                        brushing or washing, when performed (separate procedure) CPT copyright 2016 [employer]. All rights reserved. The codes documented in this report are preliminary and upon coder review may  be revised to meet current compliance requirements. Lollie Sails, MD 06/02/2015 4:41:54 PM This report has been signed electronically. Number of Addenda: 0 Note Initiated On: 06/02/2015 4:14 PM      [facility]

## 2015-06-02 NOTE — Progress Notes (Signed)
Prime doc and Dr. Vira Agar notified of pt having a large, loose, dark, bloody stool. Pt feels weak and is short of breath. BP 126/69, HR up to 140s, RR 36, 02 sat 98% on 3L. Hgb 7.8 post transfusion. Verbal order given to transfuse 1 more unit RBCs and check CBC post transfusion. Will continue to monitor pt.

## 2015-06-02 NOTE — Anesthesia Preprocedure Evaluation (Addendum)
Anesthesia Evaluation  Patient identified by MRN, date of birth, ID band Patient awake    Reviewed: Allergy & Precautions, NPO status , Patient's Chart, lab work & pertinent test results  Airway Mallampati: III       Dental  (+) Teeth Intact   Pulmonary shortness of breath, COPD, former smoker,     + decreased breath sounds      Cardiovascular Exercise Tolerance: Poor hypertension, Pt. on home beta blockers + dysrhythmias Atrial Fibrillation  Rhythm:Regular Rate:Tachycardia     Neuro/Psych    GI/Hepatic Neg liver ROS, PUD,   Endo/Other  Morbid obesity  Renal/GU      Musculoskeletal   Abdominal (+) + obese,   Peds  Hematology  (+) anemia ,   Anesthesia Other Findings   Reproductive/Obstetrics                            Anesthesia Physical Anesthesia Plan  ASA: III and emergent  Anesthesia Plan: General   Post-op Pain Management:    Induction: Intravenous  Airway Management Planned: Natural Airway and Nasal Cannula  Additional Equipment:   Intra-op Plan:   Post-operative Plan:   Informed Consent: I have reviewed the patients History and Physical, chart, labs and discussed the procedure including the risks, benefits and alternatives for the proposed anesthesia with the patient or authorized representative who has indicated his/her understanding and acceptance.     Plan Discussed with: CRNA  Anesthesia Plan Comments:         Anesthesia Quick Evaluation

## 2015-06-02 NOTE — Progress Notes (Signed)
[place] at [place] NAME: Chad Kirby    MR#:  [id number]  DATE OF BIRTH:  Nov 03, 1929  SUBJECTIVE:   Patient here due to rectal bleeding and likely a lower GI bleed.   Large rectal bleeding last night. Got 1 more unit PRBC. Weakness. Afib RVR at 130-150. REVIEW OF SYSTEMS:    Review of Systems  Constitutional: Negative for fever and chills.  HENT: Negative for congestion and tinnitus.   Eyes: Negative for blurred vision and double vision.  Respiratory: Negative for cough, shortness of breath and wheezing.   Cardiovascular: Negative for chest pain, orthopnea and PND.  Gastrointestinal: had blood in stool. Negative for nausea, vomiting, abdominal pain and diarrhea.  Genitourinary: Negative for dysuria and hematuria.  Neurological: Positive for weakness (generalized). Negative for dizziness, sensory change and focal weakness.  All other systems reviewed and are negative.   DRUG ALLERGIES:   Allergies  Allergen Reactions  . Latex Rash    VITALS:  Blood pressure 113/72, pulse 136, temperature 97.6 F (36.4 C), temperature source Oral, resp. rate 18, height 6\' 1"  (1.854 m), weight 104.5 kg (230 lb 6.1 oz), SpO2 99 %.  PHYSICAL EXAMINATION:   Physical Exam  GENERAL:  82 y.o.-year-old pale appearing patient lying in the bed with no acute distress.  EYES: Pupils equal, round, reactive to light and accommodation. No scleral icterus. Pale conjunctiva Extraocular muscles intact.  HEENT: Head atraumatic, normocephalic. Oropharynx and nasopharynx clear. Dry oral mucosa NECK:  Supple, no jugular venous distention. No thyroid enlargement, no tenderness.  LUNGS: Normal breath sounds bilaterally, no wheezing, rales, rhonchi. No use of accessory muscles of respiration.  CARDIOVASCULAR: S1, S2 irregular and tachycardia. No murmurs, rubs, or gallops.  ABDOMEN: Soft, nontender, nondistended. Bowel sounds present. No organomegaly or mass.   EXTREMITIES: No cyanosis, clubbing or edema b/l.    NEUROLOGIC: Cranial nerves II through XII are intact. No focal Motor or sensory deficits b/l. Globally weak  PSYCHIATRIC: The patient is alert and oriented x 3.  SKIN: No obvious rash, lesion, or ulcer.    LABORATORY PANEL:   CBC  Recent Labs Lab 06/02/15 0742  WBC 8.2  HGB 8.2*  HCT 24.1*  PLT 105*   ------------------------------------------------------------------------------------------------------------------  Chemistries   Recent Labs Lab 05/30/15 1437  06/02/15 0742  NA 136  < > 140  K 3.8  < > 4.6  CL 99*  < > 115*  CO2 32  < > 23  GLUCOSE 146*  < > 107*  BUN 22*  < > 24*  CREATININE 1.05  < > 0.81  CALCIUM 8.0*  < > 6.4*  MG  --   < > 1.6*  AST 27  --   --   ALT 18  --   --   ALKPHOS 94  --   --   BILITOT 0.8  --   --   < > = values in this interval not displayed. ------------------------------------------------------------------------------------------------------------------  Cardiac Enzymes  Recent Labs Lab 05/30/15 1437  TROPONINI <0.03   ------------------------------------------------------------------------------------------------------------------  RADIOLOGY:  No results found.   ASSESSMENT AND PLAN:   82 year old male with metastatic prostate cancer, recent GI bleed secondary to duodenal ulcer, chronic atrial fibrillation, hyperlipidemia, COPD who presents to the hospital due to rectal bleeding.  #1 GI bleed-suspected to be a lower GI bleed given his rectal bleeding. Patient was recently diagnosed with a duodenal ulcer although. -Status post total 5 units of packed red blood cells  and hemoglobin is 8.2 after 1 unit PRBC this am..  Continue Protonix twice daily. Transfer to ICU due to GIB and Afib with RVR, control the HR per Dr. Gustavo Lah. F/u Hb.  #2 acute blood loss anemia-secondary to the GI bleed. -Continue transfusions as needed. Follow hemoglobin.  #3 COPD-no acute  exacerbation. Continue Dulera, Spiriva. -Continue maintenance prednisone.  #4 Atrial fibrillation with RVR.   metoprolol was stopped due to hypotension. Hold aspirin given the GI bleed. BP is better, start cardizem drip and IV lopressor per Dr. Josefa Half.  #5 hypotension-secondary to volume loss from the GI bleed. Improved. -Continue IV fluids.   #6 history of CHF-clinically patient is not in congestive heart failure. Hold Lasix, beta blocker given the hypotension.  *Hypokalemia. Improved with NS with KCl. * Hypomagnesemia. Mag iv.   Discussed with Dr. Gustavo Lah and Dr. Josefa Half. All the records are reviewed and case discussed with Care Management/Social Workerr. Management plans discussed with the patient, his wife and daughter and they are in agreement.  CODE STATUS: DO NOT RESUSCITATE  DVT Prophylaxis: Teds and SCDs  TOTAL CRITICAL TIME TAKING CARE OF THIS PATIENT: 45 minutes.   POSSIBLE D/C IN 2-3 DAYS, DEPENDING ON CLINICAL CONDITION.   Demetrios Loll M.D on 06/02/2015 at 2:21 PM  Between 7am to 6pm - Pager - [phone]  After 6pm go to [email or web address] - password [email or web address] [facility]  [place] [facility]  Office  [phone]  CC: Primary care physician; No primary care provider on file.

## 2015-06-02 NOTE — Anesthesia Preprocedure Evaluation (Deleted)
Anesthesia Evaluation  Patient identified by MRN, date of birth, ID band Patient awake    Reviewed: Allergy & Precautions, NPO status , Patient's Chart, lab work & pertinent test results  Airway Mallampati: III  TM Distance: >3 FB Neck ROM: Full    Dental  (+) Chipped   Pulmonary COPD,  COPD inhaler, former smoker,    Pulmonary exam normal breath sounds clear to auscultation       Cardiovascular Normal cardiovascular exam+ dysrhythmias Atrial Fibrillation      Neuro/Psych negative neurological ROS  negative psych ROS   GI/Hepatic negative GI ROS, Neg liver ROS,   Endo/Other  negative endocrine ROS  Renal/GU negative Renal ROS     Musculoskeletal Bone mets   Abdominal Normal abdominal exam  (+)   Peds negative pediatric ROS (+)  Hematology  (+) anemia ,   Anesthesia Other Findings Cellulitis of leg  Reproductive/Obstetrics                             Anesthesia Physical  Anesthesia Plan  ASA: III  Anesthesia Plan: General and MAC   Post-op Pain Management:    Induction: Intravenous  Airway Management Planned: Nasal Cannula  Additional Equipment:   Intra-op Plan:   Post-operative Plan:   Informed Consent: I have reviewed the patients History and Physical, chart, labs and discussed the procedure including the risks, benefits and alternatives for the proposed anesthesia with the patient or authorized representative who has indicated his/her understanding and acceptance.   Dental advisory given  Plan Discussed with: CRNA and Surgeon  Anesthesia Plan Comments:         Anesthesia Quick Evaluation

## 2015-06-02 NOTE — Progress Notes (Signed)
Pt to be transferred to ICU per MD order to ICU as pt is critical and needs cardizem drip and closer monitoring. Per bed placement there are "no beds available and there are two patients awaiting beds for ICU in the ER". After being informed of this we notified the MD and he then order transfer to the telemetry unit. Again, bed placement was called but there was also no available beds on the telemetry unit. Encouraged bed placement to notify us as soon as a bed was available on either unit. Notified supervisor of our concerns. Will continue to monitor closely.

## 2015-06-02 NOTE — Progress Notes (Signed)
Dr Bridgett Larsson notified of critical lab Ca of 6.4.  Awaiting bed placement in CCU for cardizem drip ordered. Spoke to Unit Director to push for bed placement. Will continue to monitor. Haynes Hoehn RN.

## 2015-06-02 NOTE — Progress Notes (Signed)
PT Cancellation Note  Patient Details Name: Chad Kirby MRN: [id number] DOB: 05-22-1929   Cancelled Treatment:    Reason Eval/Treat Not Completed: Medical issues which prohibited therapy (Consult received and chart reviewed.  Per primary RN, patient pending transfer to CCU due to decline in medical status.  Bedrest orders persist.  Per policy, due to change in status and transfer to higher level of care, will require new orders to resume/initiate PT services.  Please re-consult as patient medically stable and appropriate for mobility.)   Rhemi Balbach H. Owens Shark, PT, DPT, NCS 06/02/2015, 8:20 AM [phone]

## 2015-06-02 NOTE — Consult Note (Signed)
Subjective: Patient seen for GI bleeding.  Episode of bloody stool overnight. Denies abdominal pain or nausea, no emesis.  Some shortness of breath overnight.   Objective: Vital signs in last 24 hours: Temp:  [97.2 F (36.2 C)-99.1 F (37.3 C)] 97.2 F (36.2 C) (03/30 0639) Pulse Rate:  [77-156] 148 (03/30 0639) Resp:  [19-36] 22 (03/30 0639) BP: (91-126)/(50-77) 117/72 mmHg (03/30 0639) SpO2:  [81 %-100 %] 98 % (03/30 0639) Blood pressure 117/72, pulse 148, temperature 97.2 F (36.2 C), temperature source Oral, resp. rate 22, height 6\' 1"  (1.854 m), weight 104.327 kg (230 lb), SpO2 98 %.   Intake/Output from previous day: 03/29 0701 - 03/30 0700 In: 2680.9 [I.V.:2060.9; Blood:620] Out: [id number]  Intake/Output this shift: Total I/O In: 101.4 [I.V.:101.4] Out: -    General appearance:  Elderly male no distress Resp:  cta Cardio:  IRR-rate 120's-150's.  GI:  Protuberant, nondistended, nontender bs positive Extremities:   2-3+ edema, stasis dermatitis.    Lab Results: Results for orders placed or performed during the hospital encounter of 05/30/15 (from the past 24 hour(s))  Hemoglobin     Status: Abnormal   Collection Time: 06/01/15 12:37 PM  Result Value Ref Range   Hemoglobin 7.3 (L) 13.0 - 18.0 g/dL  Protime-INR     Status: None   Collection Time: 06/01/15 12:37 PM  Result Value Ref Range   Prothrombin Time 14.6 11.4 - 15.0 seconds   INR 1.12   Prepare RBC     Status: None   Collection Time: 06/01/15  3:09 PM  Result Value Ref Range   Order Confirmation ORDER PROCESSED BY BLOOD BANK   CBC     Status: Abnormal   Collection Time: 06/01/15  9:18 PM  Result Value Ref Range   WBC 7.9 3.8 - 10.6 K/uL   RBC 2.52 (L) 4.40 - 5.90 MIL/uL   Hemoglobin 7.8 (L) 13.0 - 18.0 g/dL   HCT 23.2 (L) 40.0 - 52.0 %   MCV 91.9 80.0 - 100.0 fL   MCH 30.7 26.0 - 34.0 pg   MCHC 33.5 32.0 - 36.0 g/dL   RDW 20.6 (H) 11.5 - 14.5 %   Platelets 107 (L) 150 - 440 K/uL  CBC      Status: Abnormal   Collection Time: 06/02/15  7:42 AM  Result Value Ref Range   WBC 8.2 3.8 - 10.6 K/uL   RBC 2.64 (L) 4.40 - 5.90 MIL/uL   Hemoglobin 8.2 (L) 13.0 - 18.0 g/dL   HCT 24.1 (L) 40.0 - 52.0 %   MCV 91.2 80.0 - 100.0 fL   MCH 31.0 26.0 - 34.0 pg   MCHC 34.0 32.0 - 36.0 g/dL   RDW 19.2 (H) 11.5 - 14.5 %   Platelets 105 (L) 150 - 440 K/uL      Recent Labs  06/01/15 0433 06/01/15 1237 06/01/15 2118 06/02/15 0742  WBC 8.0  --  7.9 8.2  HGB 7.3* 7.3* 7.8* 8.2*  HCT 21.4*  --  23.2* 24.1*  PLT 103*  --  107* 105*   BMET  Recent Labs  05/30/15 1437 05/31/15 0023 06/01/15 0433  NA 136 137 139  K 3.8 4.3 3.3*  CL 99* 102 108  CO2 32 31 30  GLUCOSE 146* 100* 82  BUN 22* 24* 28*  CREATININE 1.05 1.01 0.86  CALCIUM 8.0* 7.6* 6.9*   LFT  Recent Labs  05/30/15 1437  PROT 4.5*  ALBUMIN 2.0*  AST 27  ALT  18  ALKPHOS 94  BILITOT 0.8   PT/INR  Recent Labs  05/30/15 1437 06/01/15 1237  LABPROT 14.3 14.6  INR 1.09 1.12   Hepatitis Panel No results for input(s): HEPBSAG, HCVAB, HEPAIGM, HEPBIGM in the last 72 hours. C-Diff No results for input(s): CDIFFTOX in the last 72 hours. No results for input(s): CDIFFPCR in the last 72 hours.   Studies/Results: No results found.  Scheduled Inpatient Medications:   . sodium chloride  10 mL/hr Intravenous Once  . sodium chloride   Intravenous Once  . collagenase  1 application Topical BID  . diltiazem  10 mg Intravenous Once  . gabapentin  300 mg Oral BID  . [START ON 10/12/2015] Leuprolide Acetate (6 Month)  45 mg Intramuscular Q6 months  . mometasone-formoterol  2 puff Inhalation BID  . predniSONE  10 mg Oral Daily  . tiotropium  18 mcg Inhalation Daily    Continuous Inpatient Infusions:   . sodium chloride    . 0.9 % NaCl with KCl 20 mEq / L 75 mL/hr at 06/02/15 0710  . diltiazem (CARDIZEM) infusion    . pantoprozole (PROTONIX) infusion 8 mg/hr (06/01/15 2339)    PRN Inpatient Medications:   acetaminophen **OR** acetaminophen, albuterol, LORazepam, ondansetron **OR** ondansetron (ZOFRAN) IV, sodium chloride, traZODone  Miscellaneous:   Assessment:  1) GI bleeding in the setting of known du s/p treatment last week, and multiple polypectomy last week. Some slow increase of hgb s/p multiple tfx, still with bloody stool.   Plan:  1) discussed with IM.  Recommend transfer to ICU, management of recusitation/A-fib prior to EGD.  EGD when clinically feasible.  Continue ppi, serial hgb, tfx as needed.   Lollie Sails MD 06/02/2015, 8:28 AM

## 2015-06-02 NOTE — Consult Note (Signed)
Patient seen again briefly.  No recurrent bleeding since 1am.  No n/v or abdominal pain.  Now on diltiazem drip, also to be given some metoprolol.  BP improved.  Case discussed with Anesthesia again, Will arrange egd when hr stable between 100-110.     I have discussed the risks benefits and complications of procedures to include not limited to bleeding, infection, perforation and the risk of sedation and the patient wishes to proceed.   Lollie Sails, MD Gastroenterology

## 2015-06-02 NOTE — Transfer of Care (Signed)
Immediate Anesthesia Transfer of Care Note  Patient: Chad Kirby  Procedure(s) Performed: Procedure(s) with comments: ESOPHAGOGASTRODUODENOSCOPY (EGD) WITH PROPOFOL (N/A) - patient may require intubation  Patient Location: PACU  Anesthesia Type:General  Level of Consciousness: awake and patient cooperative  Airway & Oxygen Therapy: Patient Spontanous Breathing and Patient connected to nasal cannula oxygen  Post-op Assessment: Report given to RN and Post -op Vital signs reviewed and stable  Post vital signs: Reviewed and stable  Last Vitals:  Filed Vitals:   06/02/15 1615 06/02/15 1640  BP: 102/57 95/65  Pulse: 94 107  Temp: 36.4 C 36.4 C  Resp:  20    Complications: No apparent anesthesia complications

## 2015-06-02 NOTE — Consult Note (Signed)
[facility] Cardiology  CARDIOLOGY CONSULT NOTE  Patient ID: Chad Kirby MRN: [id number] DOB/AGE: April 05, 1929 82 y.o.  Admit date: 05/30/2015 Referring [place] Primary Physician [facility] Primary Cardiologist Ralston Venus Reason for Consultation atrial fibrillation  HPI: 82 year old gentleman referred for evaluation of atrial fibrillation. The patient has a history of chronic atrial fibrillation on Coumadin. The patient had a GI bleed recently at which time Coumadin was discontinued. Upper endoscopy apparently revealed a duodenal ulcer and colonoscopy revealed colonic polyps. The patient has continued on aspirin. The patient was in his usual state of health until early this morning when he experienced a bloody bowel movement with dark stools. The patient denies chest pain, palpitations or heart racing. Telemetry reveals atrial fibrillation with a rapid ventricular rate. Patient was transferred to the ICU where he was placed on diltiazem drip with improvement in heart rate to the 110 to 120s. The patient currently is nothing by mouth. Initial hemoglobin was 7.3. Patient has been transfused,follow-up hemoglobin pending. The patient currently appears hemodynamically stable.  Review of systems complete and found to be negative unless listed above     Past Medical History  Diagnosis Date  . COPD (chronic obstructive pulmonary disease) ([place])   . Cancer [facility]     Prostate with bone mets-current chemo treatment  . Hyperlipidemia   . Atrial fibrillation [facility])     Past Surgical History  Procedure Laterality Date  . Replacement total knee Right   . Colonoscopy with propofol N/A 05/18/2015    Procedure: COLONOSCOPY WITH PROPOFOL;  Surgeon: Hulen Luster, MD;  Location: [facility] ENDOSCOPY;  Service: Endoscopy;  Laterality: N/A;  . Esophagogastroduodenoscopy N/A 05/19/2015    Procedure: ESOPHAGOGASTRODUODENOSCOPY (EGD);  Surgeon: Hulen Luster, MD;  Location: [facility] ENDOSCOPY;  Service: Endoscopy;  Laterality: N/A;     Prescriptions prior to admission  Medication Sig Dispense Refill Last Dose  . budesonide-formoterol (SYMBICORT) 80-4.5 MCG/ACT inhaler Inhale 2 puffs into the lungs 2 (two) times daily.   05/30/2015 at am  . collagenase (SANTYL) ointment Apply 1 application topically 2 (two) times daily. Apply a thick layer to posterior left lower leg, cover with NS damp to dry gauze, wrap with kerlix   05/30/2015 at am  . furosemide (LASIX) 20 MG tablet Take 20 mg by mouth 2 (two) times daily.    05/30/2015 at am  . gabapentin (NEURONTIN) 300 MG capsule Take 1 capsule (300 mg total) by mouth 2 (two) times daily. 60 capsule 0 05/30/2015 at am  . Leuprolide Acetate, 6 Month, (LUPRON) 45 MG injection Inject 45 mg into the muscle every 6 (six) months.   04/22/2015  . LORazepam (ATIVAN) 0.5 MG tablet Take 0.25 mg by mouth every 4 (four) hours as needed for anxiety.   prn at prn  . metoprolol succinate (TOPROL-XL) 25 MG 24 hr tablet Take 25 mg by mouth daily.   05/30/2015 at am   . morphine 20 MG/5ML solution Take 1-2 mg by mouth every hour as needed for pain.   prn at prn  . oxyCODONE (OXY IR/ROXICODONE) 5 MG immediate release tablet Take 1 tablet (5 mg total) by mouth every 4 (four) hours as needed for moderate pain. 30 tablet 0 prn at prn  . pantoprazole (PROTONIX) 40 MG tablet Take 1 tablet (40 mg total) by mouth 2 (two) times daily. 60 tablet 0 05/30/2015 at am  . polyethylene glycol (MIRALAX / GLYCOLAX) packet Take 17 g by mouth 2 (two) times daily. 14 each 0 05/30/2015 at am  .  potassium chloride SA (K-DUR,KLOR-CON) 20 MEQ tablet Take 40 mEq by mouth daily.   05/30/2015 at am  . predniSONE (DELTASONE) 10 MG tablet Take 10 mg by mouth daily.  9 05/30/2015 at am  . prochlorperazine (COMPAZINE) 10 MG tablet Take 10 mg by mouth every 6 (six) hours as needed. For nausea and vomiting.   prn at prn  . senna-docusate (SENOKOT-S) 8.6-50 MG tablet Take 2 tablets by mouth 2 (two) times daily.   05/30/2015 at am  . sodium chloride  (OCEAN) 0.65 % SOLN nasal spray Place 1 spray into both nostrils as needed for congestion.   prn at prn  . tiotropium (SPIRIVA) 18 MCG inhalation capsule Place 18 mcg into inhaler and inhale daily.   05/30/2015 at am  . traZODone (DESYREL) 50 MG tablet Take 50 mg by mouth at bedtime as needed for sleep.   prn at prn   Social History   Social History  . Marital Status: Married    Spouse Name: N/A  . Number of Children: N/A  . Years of Education: N/A   Occupational History  . Not on file.   Social History Main Topics  . Smoking status: Former Research scientist (life sciences)  . Smokeless tobacco: Not on file  . Alcohol Use: 0.0 oz/week    0 Standard drinks or equivalent per week     Comment: occasional  . Drug Use: Not on file  . Sexual Activity: Not on file   Other Topics Concern  . Not on file   Social History Narrative    Family History  Problem Relation Age of Onset  . Brain cancer Sister   . Stroke Father       Review of systems complete and found to be negative unless listed above      PHYSICAL EXAM  General: Well developed, well nourished, in no acute distress HEENT:  Normocephalic and atramatic Neck:  No JVD.  Lungs: Clear bilaterally to auscultation and percussion. Heart: HRRR . Normal S1 and S2 without gallops or murmurs.  Abdomen: Bowel sounds are positive, abdomen soft and non-tender  Msk:  Back normal, normal gait. Normal strength and tone for age. Extremities: No clubbing, cyanosis or edema.   Neuro: Alert and oriented X 3. Psych:  Good affect, responds appropriately  Labs:   Lab Results  Component Value Date   WBC 8.2 06/02/2015   HGB 8.2* 06/02/2015   HCT 24.1* 06/02/2015   MCV 91.2 06/02/2015   PLT 105* 06/02/2015    Recent Labs Lab 05/30/15 1437  06/02/15 0742  NA 136  < > 140  K 3.8  < > 4.6  CL 99*  < > 115*  CO2 32  < > 23  BUN 22*  < > 24*  CREATININE 1.05  < > 0.81  CALCIUM 8.0*  < > 6.4*  PROT 4.5*  --   --   BILITOT 0.8  --   --   ALKPHOS 94   --   --   ALT 18  --   --   AST 27  --   --   GLUCOSE 146*  < > 107*  < > = values in this interval not displayed. Lab Results  Component Value Date   TROPONINI <0.03 05/30/2015   No results found for: CHOL No results found for: HDL No results found for: LDLCALC No results found for: TRIG No results found for: CHOLHDL No results found for: LDLDIRECT    Radiology: Ct Abdomen Pelvis Wo  Contrast  05/04/2015  CLINICAL DATA:  82 year old male with abdominal distention. No bowel movement for several days. EXAM: CT ABDOMEN AND PELVIS WITHOUT CONTRAST TECHNIQUE: Multidetector CT imaging of the abdomen and pelvis was performed following the standard protocol without IV contrast. COMPARISON:  No priors. FINDINGS: Lower chest: Heart size is enlarged with biatrial dilatation. Atherosclerotic calcifications are noted in the left main, left anterior descending, left circumflex and right coronary arteries. Mild calcifications of the aortic valve. Trace bilateral pleural effusions lying dependently. Dependent subsegmental atelectasis in the lower lobes of the lungs bilaterally. Hepatobiliary: No cystic or solid hepatic lesions are identified on today's noncontrast CT examination. Unenhanced appearance of the gallbladder is normal. Pancreas: No pancreatic mass or peripancreatic inflammatory changes on today's noncontrast CT examination. Spleen: Unremarkable. Adrenals/Urinary Tract: Tiny 2-3 mm calcifications in the right renal hilum, may be vascular or may be nonobstructive calculi. Parenchymal calcification in the upper pole of the left kidney is likely dystrophic. Unenhanced appearance of the kidneys is otherwise unremarkable. No ureteral stones. No hydroureteronephrosis. Urinary bladder is completely decompressed around an indwelling Foley catheter. Bilateral adrenal glands are normal in appearance. Stomach/Bowel: Unenhanced appearance of the stomach is normal. No pathologic dilatation of small bowel or colon.  Moderate gaseous distention is noted in the colon, which is highly nonspecific. Appendix is not confidently identified may be surgically absent. Regardless, there are no inflammatory changes noted adjacent to the cecum to suggest the presence of an acute appendicitis at this time. Vascular/Lymphatic: Atherosclerotic calcifications are noted throughout the abdominal and pelvic vasculature, without evidence of aneurysm or dissection. No lymphadenopathy noted in the abdomen or pelvis on today's noncontrast CT examination. Reproductive: Prostate gland is diminutive and partially calcified. Seminal vesicles are unremarkable in appearance. Other: No significant volume of ascites.  No pneumoperitoneum. Musculoskeletal: Numerous sclerotic lesions noted throughout the visualized axial and appendicular skeleton, highly concerning for widespread metastatic disease to the bones. The largest of the lesions are noted with than T11, T12 and L2 vertebral bodies which are nearly completely sclerotic. IMPRESSION: 1. No acute findings in the abdomen or pelvis. Specifically, no signs of bowel obstruction. 2. Widespread sclerotic lesions throughout the visualized axial and appendicular skeleton, highly concerning for metastatic disease to the bones. Statistically, this likely reflects prostate cancer. Correlation with PSA levels and physical examination is recommended. 3. Mild cardiomegaly with biatrial dilatation. 4. Trace bilateral pleural effusions lying dependently. 5. Tiny calcifications associated with DUB right renal hilum may represent 2-3 mm nonobstructive calculi, or may simply be vascular. 6. Additional incidental findings, as above. Electronically Signed   By: Vinnie Langton M.D.   On: 05/04/2015 18:42   Dg Chest 2 View  05/21/2015  CLINICAL DATA:  Chills.  Shortness of breath. EXAM: CHEST  2 VIEW COMPARISON:  May 16, 2015 FINDINGS: No pneumothorax. Bilateral pleural effusions with underlying atelectasis are again  seen. Stable cardiomediastinal silhouette. Mild edema. Sclerotic vertebral bodies are again noted. IMPRESSION: Bilateral pleural effusions with underlying atelectasis and mild edema. Stable sclerotic vertebral bodies. Electronically Signed   By: Dorise Bullion III M.D   On: 05/21/2015 15:09   Dg Abd 2 Views  05/04/2015  CLINICAL DATA:  Constipation, nausea and vomiting for 3 days, unable to have bowel movement, left leg cellulitis, history of prostate cancer. EXAM: ABDOMEN - 2 VIEW COMPARISON:  03/20/2006 FINDINGS: Mild gaseous distended small bowel loops are noted mid abdomen suspicious for ileus. There is significant gaseous distended right colon transverse colon and proximal left colon. Findings highly  suspicious for colonic ileus. Some colonic gas and stool are noted in distal sigmoid colon and rectum. IMPRESSION: Mild gaseous distended small bowel loops are noted mid abdomen suspicious for ileus. There is significant gaseous distended right colon transverse colon and proximal left colon. Findings highly suspicious for colonic ileus. Some colonic gas and stool are noted in distal sigmoid colon and rectum. Electronically Signed   By: Lahoma Crocker M.D.   On: 05/04/2015 13:13   Dg Abd Acute W/chest  05/16/2015  CLINICAL DATA:  Rectal bleeding, decreased bowel movements EXAM: DG ABDOMEN ACUTE W/ 1V CHEST COMPARISON:  05/04/2015 FINDINGS: Cardiac shadow is enlarged. Elevation of the left hemidiaphragm is again noted. Bibasilar atelectatic changes are noted without focal confluent infiltrate. Scattered large and small bowel gas is noted. No free air is seen. No definitive obstructive change is noted. Multiple sclerotic vertebral bodies are noted consistent with metastatic disease. IMPRESSION: Stable bibasilar atelectatic changes. Sclerotic vertebral bodies consistent with metastatic disease. Scattered large and small bowel gas without obstructive change. Electronically Signed   By: Inez Catalina M.D.   On:  05/16/2015 12:10    EKG: atrial fibrillation  ASSESSMENT AND PLAN:   1. Atrial fibrillation with a rapid ventricular response,secondary to active GI bleed, heart rate improved on diltiazem drip. 2. GI bleed, known duodenal ulcer and colonic polyps  Recommendations  1. Continue diltiazem drip, up titrate as tolerated 2. Add Lopressor 2.5 mg IV every 6 since patient currently nothing by mouth 3. Proceed with GI workup,patient currently appears hemodynamically stable. Very difficult to control heart rate when patient is nothing by mouth with active GI bleeding. Patient currently asyptomatic with respect to atrial fibrillation.  SignedIsaias Cowman MD,PhD, [facility] 06/02/2015, 12:55 PM

## 2015-06-02 NOTE — Progress Notes (Signed)
Spoke with Dr. Gustavo Lah via phone. Notified of recent dark stool (smear), BP, and tachycardia. Lab to draw CBC 1 hr post infusion, order placed. Will continue to monitor pt.

## 2015-06-02 NOTE — Anesthesia Postprocedure Evaluation (Signed)
Anesthesia Post Note  Patient: Chad Kirby  Procedure(s) Performed: Procedure(s) (LRB): ESOPHAGOGASTRODUODENOSCOPY (EGD) WITH PROPOFOL (N/A)  Patient location during evaluation: PACU Anesthesia Type: General Level of consciousness: awake Pain management: pain level controlled Vital Signs Assessment: post-procedure vital signs reviewed and stable Respiratory status: spontaneous breathing Cardiovascular status: blood pressure returned to baseline Anesthetic complications: no    Last Vitals:  Filed Vitals:   06/02/15 1710 06/02/15 1800  BP: 100/68   Pulse: 110   Temp:  36.7 C  Resp: 18     Last Pain:  Filed Vitals:   06/02/15 1828  PainSc: 0-No pain                 VAN STAVEREN,Brentley Horrell

## 2015-06-02 NOTE — Significant Event (Signed)
Rapid Response Event Note  Overview:  called to patient room for tachycardia, active GI bleed    Initial Focused Assessment: Patient awake and alert, able to answer questions. Tachycardic in 130s.  Interventions: Tranfer to ICU 11 for Cardizem drip.  Event Summary:   at      at          [facility]

## 2015-06-03 ENCOUNTER — Encounter: Payer: Self-pay | Admitting: Gastroenterology

## 2015-06-03 LAB — TYPE AND SCREEN
ABO/RH(D): A POS
ANTIBODY SCREEN: NEGATIVE
UNIT DIVISION: 0
UNIT DIVISION: 0
Unit division: 0
Unit division: 0
Unit division: 0
Unit division: 0

## 2015-06-03 LAB — CBC
HCT: 23.2 % — ABNORMAL LOW (ref 40.0–52.0)
HCT: 25.9 % — ABNORMAL LOW (ref 40.0–52.0)
Hemoglobin: 7.8 g/dL — ABNORMAL LOW (ref 13.0–18.0)
Hemoglobin: 8.4 g/dL — ABNORMAL LOW (ref 13.0–18.0)
MCH: 31.6 pg (ref 26.0–34.0)
MCH: 30.5 pg (ref 26.0–34.0)
MCHC: 33.8 g/dL (ref 32.0–36.0)
MCHC: 32.6 g/dL (ref 32.0–36.0)
MCV: 93.4 fL (ref 80.0–100.0)
MCV: 93.6 fL (ref 80.0–100.0)
PLATELETS: 115 10*3/uL — AB (ref 150–440)
PLATELETS: 117 10*3/uL — AB (ref 150–440)
RBC: 2.48 MIL/uL — ABNORMAL LOW (ref 4.40–5.90)
RBC: 2.76 MIL/uL — AB (ref 4.40–5.90)
RDW: 20 % — AB (ref 11.5–14.5)
RDW: 21 % — ABNORMAL HIGH (ref 11.5–14.5)
WBC: 6.9 10*3/uL (ref 3.8–10.6)
WBC: 7.4 10*3/uL (ref 3.8–10.6)

## 2015-06-03 LAB — BASIC METABOLIC PANEL
Anion gap: 2 — ABNORMAL LOW (ref 5–15)
BUN: 21 mg/dL — ABNORMAL HIGH (ref 6–20)
CALCIUM: 6.3 mg/dL — AB (ref 8.9–10.3)
CO2: 22 mmol/L (ref 22–32)
Chloride: 115 mmol/L — ABNORMAL HIGH (ref 101–111)
Creatinine, Ser: 0.71 mg/dL (ref 0.61–1.24)
GLUCOSE: 111 mg/dL — AB (ref 65–99)
Potassium: 4.2 mmol/L (ref 3.5–5.1)
SODIUM: 139 mmol/L (ref 135–145)

## 2015-06-03 LAB — MAGNESIUM: MAGNESIUM: 2 mg/dL (ref 1.7–2.4)

## 2015-06-03 MED ORDER — SODIUM CHLORIDE 0.9 % IV SOLN
8.0000 mg/h | INTRAVENOUS | Status: DC
  Administered 2015-06-03 – 2015-06-05 (×5): 8 mg/h via INTRAVENOUS
  Filled 2015-06-03 (×4): qty 80

## 2015-06-03 MED ORDER — AMMONIUM LACTATE 12 % EX LOTN
TOPICAL_LOTION | Freq: Every day | CUTANEOUS | Status: DC
  Administered 2015-06-03 – 2015-06-10 (×7): via TOPICAL
  Filled 2015-06-03 (×2): qty 226

## 2015-06-03 NOTE — Progress Notes (Signed)
Patient has been alert and oriented. No complaints of pain. Heart rate did increase to around 110 before giving IV metoprolol scheduled, but other than that heart rate has stayed below 100. Wife is at bedside. IV protonix and NaCl with 20 of K running currently. Will continue to monitor.

## 2015-06-03 NOTE — Consult Note (Addendum)
WOC wound consult note WOC wound consult note Reason for Consult: NOnhealing ulcer to left posterior lower leg, present on admission, full thickness Wound type:Consistent with venous insufficiency Pressure Ulcer POA: N/A Measurement:13 cm x 6 cm x 0.2 cm with devitalized tissue in wound bed. 2 full thickness plugs of adherent slough noted in wound bed and new wound distal to this large wound is present and daughter indicates this is new.  Measures 2 cm x 2 cm adherent eschar.  WIll begin enzymatic debridement.  Wound bed:50% devitalized tissue Drainage (amount, consistency, odor) Moderate serosanguinous No odor Periwound:Chronic skin changes from venous insufficiency Dressing procedure/placement/frequency:Cleanse wounds to left posterior lower leg with NS and pat gently dry.Apply Amlactin cream to intact skin to moisturize.  Apply santyl ointment to wound beds. Cover with NS moist gauze, 4x4 and kerlix. Change daily.  Will not follow at this time. Please re-consult if needed.  Domenic Moras RN BSN [facility] Pager [phone]

## 2015-06-03 NOTE — Progress Notes (Signed)
Patient transferred to 2A from CCU. Cardizem drip was titrated off before coming from the unit. Patient is resting quietly. Will continue to monitor.

## 2015-06-03 NOTE — Progress Notes (Signed)
Dr. Verdell Carmine notified of the patients low calcium but with low albumin corrects to 7.9 and also drop in hgl from 8.5 to 7.9. No new orders given at this time.  Continue to monitor and notify MD as needed.

## 2015-06-03 NOTE — Progress Notes (Signed)
[facility] Cardiology  SUBJECTIVE: I feel better   Filed Vitals:   06/03/15 0400 06/03/15 0500 06/03/15 0600 06/03/15 0700  BP: 97/64 104/79 101/60 105/53  Pulse: 93 96 91 85  Temp:      TempSrc:      Resp: 19 21 13 15   Height:      Weight:      SpO2: 97% 91% 92% 95%     Intake/Output Summary (Last 24 hours) at 06/03/15 1301 Last data filed at 06/03/15 0700  Gross per 24 hour  Intake 2663.3 ml  Output    525 ml  Net 2138.3 ml      PHYSICAL EXAM  General: Well developed, well nourished, in no acute distress HEENT:  Normocephalic and atramatic Neck:  No JVD.  Lungs: Clear bilaterally to auscultation and percussion. Heart: HRRR . Normal S1 and S2 without gallops or murmurs.  Abdomen: Bowel sounds are positive, abdomen soft and non-tender  Msk:  Back normal, normal gait. Normal strength and tone for age. Extremities: No clubbing, cyanosis or edema.   Neuro: Alert and oriented X 3. Psych:  Good affect, responds appropriately   LABS: Basic Metabolic Panel:  Recent Labs  06/02/15 0742 06/03/15 0318  NA 140 139  K 4.6 4.2  CL 115* 115*  CO2 23 22  GLUCOSE 107* 111*  BUN 24* 21*  CREATININE 0.81 0.71  CALCIUM 6.4* 6.3*  MG 1.6* 2.0   Liver Function Tests: No results for input(s): AST, ALT, ALKPHOS, BILITOT, PROT, ALBUMIN in the last 72 hours. No results for input(s): LIPASE, AMYLASE in the last 72 hours. CBC:  Recent Labs  05/31/15 1742  06/03/15 0318 06/03/15 0820  WBC 8.1  < > 6.9 7.4  NEUTROABS 7.0*  --   --   --   HGB 6.2*  < > 7.8* 8.4*  HCT 18.5*  < > 23.2* 25.9*  MCV 97.0  < > 93.4 93.6  PLT 119*  < > 115* 117*  < > = values in this interval not displayed. Cardiac Enzymes: No results for input(s): CKTOTAL, CKMB, CKMBINDEX, TROPONINI in the last 72 hours. BNP: Invalid input(s): POCBNP D-Dimer: No results for input(s): DDIMER in the last 72 hours. Hemoglobin A1C: No results for input(s): HGBA1C in the last 72 hours. Fasting Lipid Panel: No  results for input(s): CHOL, HDL, LDLCALC, TRIG, CHOLHDL, LDLDIRECT in the last 72 hours. Thyroid Function Tests: No results for input(s): TSH, T4TOTAL, T3FREE, THYROIDAB in the last 72 hours.  Invalid input(s): FREET3 Anemia Panel: No results for input(s): VITAMINB12, FOLATE, FERRITIN, TIBC, IRON, RETICCTPCT in the last 72 hours.  No results found.   Echo   TELEMETRY: Atrial fibrillation:  ASSESSMENT AND PLAN:  Principal Problem:   GIB (gastrointestinal bleeding) Active Problems:   Anemia    1. Atrial fibrillation, rate controlled on diltiazem drip and IV Lopressor 2. GI bleed, unremarkable upper endoscopy, currently appears clinically stable. If recurrent bleed, we'll proceed to bleeding scan and possible colonoscopy  Recommendations  1. Continue IV Cardizem and Lopressor, converted to by mouth when feasible 2. Defer chronic anticoagulation at this time   Isaias Cowman, MD, PhD, [facility] 06/03/2015 1:01 PM

## 2015-06-03 NOTE — Progress Notes (Signed)
[place] at [place] NAME: Chad Kirby    MR#:  [id number]  DATE OF BIRTH:  09/06/1929  SUBJECTIVE:   Patient here due to rectal bleeding and likely a lower GI bleed.   No black stool or bloody stool.Weakness.  Status post EGD. Heart rate is controlled with Cardizem. REVIEW OF SYSTEMS:    Review of Systems  Constitutional: Negative for fever and chills.  HENT: Negative for congestion and tinnitus.   Eyes: Negative for blurred vision and double vision.  Respiratory: Negative for cough, shortness of breath and wheezing.   Cardiovascular: Negative for chest pain, orthopnea and PND.  Gastrointestinal: Negative for nausea, vomiting, abdominal pain and diarrhea.  no melena or bloody stool. Genitourinary: Negative for dysuria and hematuria.  Neurological: Positive for weakness (generalized). Negative for dizziness, sensory change and focal weakness.  All other systems reviewed and are negative.   DRUG ALLERGIES:   Allergies  Allergen Reactions  . Latex Rash    VITALS:  Blood pressure 102/57, pulse 94, temperature 97.4 F (36.3 C), temperature source Oral, resp. rate 20, height 6\' 1"  (1.854 m), weight 104.5 kg (230 lb 6.1 oz), SpO2 93 %.  PHYSICAL EXAMINATION:   Physical Exam  GENERAL:  82 y.o.-year-old pale appearing patient lying in the bed with no acute distress.  EYES: Pupils equal, round, reactive to light and accommodation. No scleral icterus. Pale conjunctiva Extraocular muscles intact.  HEENT: Head atraumatic, normocephalic. Oropharynx and nasopharynx clear. Dry oral mucosa NECK:  Supple, no jugular venous distention. No thyroid enlargement, no tenderness.  LUNGS: Normal breath sounds bilaterally, no wheezing, rales, rhonchi. No use of accessory muscles of respiration.  CARDIOVASCULAR: S1, S2 irregular and tachycardia. No murmurs, rubs, or gallops.  ABDOMEN: Soft, nontender, nondistended. Bowel sounds present. No  organomegaly or mass.  EXTREMITIES: No cyanosis, clubbing or edema b/l.    NEUROLOGIC: Cranial nerves II through XII are intact. No focal Motor or sensory deficits b/l. Globally weak  PSYCHIATRIC: The patient is alert and oriented x 3.  SKIN: No obvious rash, lesion, or ulcer.    LABORATORY PANEL:   CBC  Recent Labs Lab 06/03/15 0820  WBC 7.4  HGB 8.4*  HCT 25.9*  PLT 117*   ------------------------------------------------------------------------------------------------------------------  Chemistries   Recent Labs Lab 05/30/15 1437  06/03/15 0318  NA 136  < > 139  K 3.8  < > 4.2  CL 99*  < > 115*  CO2 32  < > 22  GLUCOSE 146*  < > 111*  BUN 22*  < > 21*  CREATININE 1.05  < > 0.71  CALCIUM 8.0*  < > 6.3*  MG  --   < > 2.0  AST 27  --   --   ALT 18  --   --   ALKPHOS 94  --   --   BILITOT 0.8  --   --   < > = values in this interval not displayed. ------------------------------------------------------------------------------------------------------------------  Cardiac Enzymes  Recent Labs Lab 05/30/15 1437  TROPONINI <0.03   ------------------------------------------------------------------------------------------------------------------  RADIOLOGY:  No results found.   ASSESSMENT AND PLAN:   82 year old male with metastatic prostate cancer, recent GI bleed secondary to duodenal ulcer, chronic atrial fibrillation, hyperlipidemia, COPD who presents to the hospital due to rectal bleeding.  #1 GI bleed-suspected to be a lower GI bleed given his rectal bleeding. Patient was recently diagnosed with a duodenal ulcer although. -Status post total 6 units of packed red  blood cells and hemoglobin is 8.4 this am.. Active bleeding of duodenal ulcer seen on EGD yesterday.  Per Dr. Gustavo Lah, Protonix drip for 3 days then change to twice a day;  He will need to be on a twice a day PPI at home;  Continue to hold anticoagulation for a at least week to 10 days start  clear liquid diet today no red no carbonation. Would continue that for another day before advancing to full liquids for 2-3 days before advancing to soft. F/u Hb.  #2 acute blood loss anemia-secondary to the GI bleed. Status post total 6 units of packed red blood cells and hemoglobin is 8.4 this am. -Continue transfusions as needed. Follow hemoglobin.  #3 COPD-no acute exacerbation. Continue Dulera, Spiriva. -Continue maintenance prednisone.  #4 Atrial fibrillation with RVR.   metoprolol was stopped due to hypotension. Hold aspirin given the GI bleed. Heart rate is controlled, continue cardizem drip and IV lopressor per Dr. Josefa Half.  #5 hypotension-secondary to volume loss from the GI bleed. Improved. -Continue IV fluids.   #6 history of diastolic CHF, ejection fraction 55-60%.-clinically patient is not in congestive heart failure. Hold Lasix, beta blocker given the hypotension.  *Hypokalemia. Improved with NS with KCl. * Hypomagnesemia. Improved with Mag iv.   All the records are reviewed and case discussed with Care Management/Social Workerr. Management plans discussed with the patient, his daughter and they are in agreement.  CODE STATUS: DO NOT RESUSCITATE  DVT Prophylaxis: Teds and SCDs  TOTAL  TIME TAKING CARE OF THIS PATIENT: 39 minutes.   POSSIBLE D/C IN 3 DAYS, DEPENDING ON CLINICAL CONDITION.   Demetrios Loll M.D on 06/03/2015 at 5:37 PM  Between 7am to 6pm - Pager - [phone]  After 6pm go to [email or web address] - password [email or web address] [facility]  [place] [facility]  Office  [phone]  CC: Primary care physician; No primary care provider on file.

## 2015-06-03 NOTE — Care Management (Signed)
patient transferred to icu due to atrial fib.  Coumadin discontinued due to need for endoscopy.  Placed on cardizem drip.  It appears CSW is involved and that patient presents from Patient presents from [facility]. Patient will require authorization to return to skilled nursing under his blue medicare.

## 2015-06-03 NOTE — Consult Note (Signed)
Subjective: Patient seen for GI bleeding. Patient has done well overnight. There's been no further evidence of bleeding. He's had no bowel movement since yesterday morning. There is no nausea or abdominal pain. There is no emesis.  Objective: Vital signs in last 24 hours: Temp:  [97.5 F (36.4 C)-98 F (36.7 C)] 97.5 F (36.4 C) (03/31 0200) Pulse Rate:  [82-114] 85 (03/31 0700) Resp:  [12-21] 15 (03/31 0700) BP: (91-113)/(51-79) 105/53 mmHg (03/31 0700) SpO2:  [91 %-100 %] 95 % (03/31 0700) Blood pressure 105/53, pulse 85, temperature 97.5 F (36.4 C), temperature source Oral, resp. rate 15, height 6\' 1"  (1.854 m), weight 104.5 kg (230 lb 6.1 oz), SpO2 95 %.   Intake/Output from previous day: 03/30 0701 - 03/31 0700 In: 3797.2 [I.V.:3747.2; IV Piggyback:50] Out: [id number] [Urine:1225]  Intake/Output this shift:     General appearance:  73 male no distress Resp:  Clear to auscultation Cardio:  Irregularly irregular GI:  Soft nontender nondistended bowel sounds positive normoactive Extremities:     Lab Results: Results for orders placed or performed during the hospital encounter of 05/30/15 (from the past 24 hour(s))  CBC     Status: Abnormal   Collection Time: 06/02/15  2:44 PM  Result Value Ref Range   WBC 8.9 3.8 - 10.6 K/uL   RBC 2.77 (L) 4.40 - 5.90 MIL/uL   Hemoglobin 8.6 (L) 13.0 - 18.0 g/dL   HCT 25.9 (L) 40.0 - 52.0 %   MCV 93.7 80.0 - 100.0 fL   MCH 31.2 26.0 - 34.0 pg   MCHC 33.3 32.0 - 36.0 g/dL   RDW 19.7 (H) 11.5 - 14.5 %   Platelets 114 (L) 150 - 440 K/uL  CBC     Status: Abnormal   Collection Time: 06/02/15  8:23 PM  Result Value Ref Range   WBC 7.6 3.8 - 10.6 K/uL   RBC 2.71 (L) 4.40 - 5.90 MIL/uL   Hemoglobin 8.5 (L) 13.0 - 18.0 g/dL   HCT 25.3 (L) 40.0 - 52.0 %   MCV 93.4 80.0 - 100.0 fL   MCH 31.5 26.0 - 34.0 pg   MCHC 33.7 32.0 - 36.0 g/dL   RDW 20.0 (H) 11.5 - 14.5 %   Platelets 124 (L) 150 - 440 K/uL  CBC     Status: Abnormal   Collection  Time: 06/03/15  3:18 AM  Result Value Ref Range   WBC 6.9 3.8 - 10.6 K/uL   RBC 2.48 (L) 4.40 - 5.90 MIL/uL   Hemoglobin 7.8 (L) 13.0 - 18.0 g/dL   HCT 23.2 (L) 40.0 - 52.0 %   MCV 93.4 80.0 - 100.0 fL   MCH 31.6 26.0 - 34.0 pg   MCHC 33.8 32.0 - 36.0 g/dL   RDW 20.0 (H) 11.5 - 14.5 %   Platelets 115 (L) 150 - 440 K/uL  Basic metabolic panel     Status: Abnormal   Collection Time: 06/03/15  3:18 AM  Result Value Ref Range   Sodium 139 135 - 145 mmol/L   Potassium 4.2 3.5 - 5.1 mmol/L   Chloride 115 (H) 101 - 111 mmol/L   CO2 22 22 - 32 mmol/L   Glucose, Bld 111 (H) 65 - 99 mg/dL   BUN 21 (H) 6 - 20 mg/dL   Creatinine, Ser 0.71 0.61 - 1.24 mg/dL   Calcium 6.3 (LL) 8.9 - 10.3 mg/dL   GFR calc non Af Amer >60 >60 mL/min   GFR calc Af Amer >60 >  60 mL/min   Anion gap 2 (L) 5 - 15  Magnesium     Status: None   Collection Time: 06/03/15  3:18 AM  Result Value Ref Range   Magnesium 2.0 1.7 - 2.4 mg/dL  CBC     Status: Abnormal   Collection Time: 06/03/15  8:20 AM  Result Value Ref Range   WBC 7.4 3.8 - 10.6 K/uL   RBC 2.76 (L) 4.40 - 5.90 MIL/uL   Hemoglobin 8.4 (L) 13.0 - 18.0 g/dL   HCT 25.9 (L) 40.0 - 52.0 %   MCV 93.6 80.0 - 100.0 fL   MCH 30.5 26.0 - 34.0 pg   MCHC 32.6 32.0 - 36.0 g/dL   RDW 21.0 (H) 11.5 - 14.5 %   Platelets 117 (L) 150 - 440 K/uL      Recent Labs  06/02/15 2023 06/03/15 0318 06/03/15 0820  WBC 7.6 6.9 7.4  HGB 8.5* 7.8* 8.4*  HCT 25.3* 23.2* 25.9*  PLT 124* 115* 117*   BMET  Recent Labs  06/01/15 0433 06/02/15 0742 06/03/15 0318  NA 139 140 139  K 3.3* 4.6 4.2  CL 108 115* 115*  CO2 30 23 22   GLUCOSE 82 107* 111*  BUN 28* 24* 21*  CREATININE 0.86 0.81 0.71  CALCIUM 6.9* 6.4* 6.3*   LFT No results for input(s): PROT, ALBUMIN, AST, ALT, ALKPHOS, BILITOT, BILIDIR, IBILI in the last 72 hours. PT/INR  Recent Labs  06/01/15 1237  LABPROT 14.6  INR 1.12   Hepatitis Panel No results for input(s): HEPBSAG, HCVAB, HEPAIGM,  HEPBIGM in the last 72 hours. C-Diff No results for input(s): CDIFFTOX in the last 72 hours. No results for input(s): CDIFFPCR in the last 72 hours.   Studies/Results: No results found.  Scheduled Inpatient Medications:   . sodium chloride   Intravenous Once  . ammonium lactate   Topical Daily  . antiseptic oral rinse  7 mL Mouth Rinse q12n4p  . chlorhexidine  15 mL Mouth Rinse BID  . collagenase  1 application Topical BID  . gabapentin  300 mg Oral BID  . [START ON 10/12/2015] Leuprolide Acetate (6 Month)  45 mg Intramuscular Q6 months  . metoprolol  2.5 mg Intravenous 4 times per day  . mometasone-formoterol  2 puff Inhalation BID  . predniSONE  10 mg Oral Daily  . tiotropium  18 mcg Inhalation Daily    Continuous Inpatient Infusions:   . 0.9 % NaCl with KCl 20 mEq / L 75 mL/hr at 06/03/15 0700  . diltiazem (CARDIZEM) infusion 10 mg/hr (06/03/15 0700)  . pantoprozole (PROTONIX) infusion 8 mg/hr (06/03/15 0942)    PRN Inpatient Medications:  acetaminophen **OR** acetaminophen, albuterol, LORazepam, ondansetron **OR** ondansetron (ZOFRAN) IV, sodium chloride, traZODone  Miscellaneous:   Assessment:  1. GI bleeding. Recurrent after finding of duodenal ulcer on previous evaluation. No melena since yesterday morning. Active bleeding of duodenal ulcer seen on EGD yesterday.  Plan:  1. Continue IV PPI drip for another 36 hours. Then change to twice a day. He will need to be on a twice a day PPI at home. 2. Continue to hold anticoagulation for a at least week to 10 days. 3. Will start clear liquid diet today no red no carbonation. Would continue that for another day before advancing to full liquids for 2-3 days before advancing to soft. 4. Dr. Vira Agar to cover this weekend.  Lollie Sails MD 06/03/2015, 1:14 PM

## 2015-06-03 NOTE — Progress Notes (Signed)
Pt has remained A & O x 3 with disorientation to year. Lung sounds are clear to auscultation, pt on 2LNC with O2 sats >90%-will remove to see if RA tolerated . Dry, non-productive cough since yesterday-could be r/t upper endoscopy yesterday afternoon. No active bleeding noted. Bowel sounds active x 4 quadrants, high-pitched in RUQ. Wound care nurse consulted and updated dsg changes to LLE cellulitis. Overall, pt afebrile and seems well compared to yesterday. Order to transfer to floor.

## 2015-06-04 ENCOUNTER — Inpatient Hospital Stay: Payer: Medicare Other

## 2015-06-04 LAB — BASIC METABOLIC PANEL
ANION GAP: 0 — AB (ref 5–15)
BUN: 13 mg/dL (ref 6–20)
CO2: 25 mmol/L (ref 22–32)
Calcium: 6.3 mg/dL — CL (ref 8.9–10.3)
Chloride: 115 mmol/L — ABNORMAL HIGH (ref 101–111)
Creatinine, Ser: 0.67 mg/dL (ref 0.61–1.24)
GFR calc Af Amer: >60 mL/min (ref >60)
Glucose, Bld: 87 mg/dL (ref 65–99)
POTASSIUM: 3.5 mmol/L (ref 3.5–5.1)
SODIUM: 140 mmol/L (ref 135–145)

## 2015-06-04 LAB — CBC
HCT: 24.3 % — ABNORMAL LOW (ref 40.0–52.0)
HEMATOCRIT: 24.2 % — AB (ref 40.0–52.0)
HEMOGLOBIN: 8.1 g/dL — AB (ref 13.0–18.0)
Hemoglobin: 8 g/dL — ABNORMAL LOW (ref 13.0–18.0)
MCH: 31.5 pg (ref 26.0–34.0)
MCH: 31.2 pg (ref 26.0–34.0)
MCHC: 33 g/dL (ref 32.0–36.0)
MCHC: 33.1 g/dL (ref 32.0–36.0)
MCV: 95.3 fL (ref 80.0–100.0)
MCV: 94.1 fL (ref 80.0–100.0)
PLATELETS: 120 10*3/uL — AB (ref 150–440)
Platelets: 119 10*3/uL — ABNORMAL LOW (ref 150–440)
RBC: 2.54 MIL/uL — AB (ref 4.40–5.90)
RBC: 2.58 MIL/uL — AB (ref 4.40–5.90)
RDW: 21.9 % — ABNORMAL HIGH (ref 11.5–14.5)
RDW: 21.3 % — ABNORMAL HIGH (ref 11.5–14.5)
WBC: 6.3 10*3/uL (ref 3.8–10.6)
WBC: 6.2 10*3/uL (ref 3.8–10.6)

## 2015-06-04 LAB — HEMOGLOBIN: HEMOGLOBIN: 7.9 g/dL — AB (ref 13.0–18.0)

## 2015-06-04 MED ORDER — SODIUM CHLORIDE 0.9 % IV SOLN
2.0000 g | Freq: Once | INTRAVENOUS | Status: AC
  Administered 2015-06-04: 2 g via INTRAVENOUS
  Filled 2015-06-04: qty 20

## 2015-06-04 MED ORDER — TECHNETIUM TC 99M-LABELED RED BLOOD CELLS IV KIT: 20.0000 | PACK | Freq: Once | INTRAVENOUS | Status: DC | PRN

## 2015-06-04 MED ORDER — DILTIAZEM HCL 25 MG/5ML IV SOLN
10.0000 mg | Freq: Once | INTRAVENOUS | Status: AC
  Administered 2015-06-04: 10 mg via INTRAVENOUS
  Filled 2015-06-04: qty 5

## 2015-06-04 NOTE — Progress Notes (Signed)
PT Cancellation Note  Patient Details Name: MYER DAGGETT MRN: [id number] DOB: Oct 23, 1929   Cancelled Treatment:    Reason Eval/Treat Not Completed: Other (comment)  Pt had another bout of bloody stool.  Nursing reports he is going to be off the floor for a few hours and to hold PT for today.  Will try back tomorrow if appropriate.   Wayne Both, PT, DPT [phone]  Kreg Shropshire 06/04/2015, 12:23 PM

## 2015-06-04 NOTE — Progress Notes (Signed)
i was told that the drip was d/ced but found an active order.  Talked to dr Mindi Curling and was told to resume the drip,.

## 2015-06-04 NOTE — Progress Notes (Addendum)
[place] at [place] NAME: Chad Kirby    MR#:  [id number]  DATE OF BIRTH:  02-10-1930  SUBJECTIVE:   Patient here due to rectal bleeding and likely a lower GI bleed.   marroon dark blood stool this am. Still Weakness.  Status post EGD. Heart rate is controlled with Cardizem. REVIEW OF SYSTEMS:    Review of Systems  Constitutional: Negative for fever and chills.  HENT: Negative for congestion and tinnitus.   Eyes: Negative for blurred vision and double vision.  Respiratory: Negative for cough, shortness of breath and wheezing.   Cardiovascular: Negative for chest pain, orthopnea and PND.  Gastrointestinal: Negative for nausea, vomiting, abdominal pain and diarrhea.  marroon dark blood stool. Genitourinary: Negative for dysuria and hematuria.  Neurological: Positive for weakness (generalized). Negative for dizziness, sensory change and focal weakness.  All other systems reviewed and are negative.   DRUG ALLERGIES:   Allergies  Allergen Reactions  . Latex Rash    VITALS:  Blood pressure 98/60, pulse 85, temperature 97.5 F (36.4 C), temperature source Oral, resp. rate 18, height 6\' 1"  (1.854 m), weight 104.5 kg (230 lb 6.1 oz), SpO2 94 %.  PHYSICAL EXAMINATION:   Physical Exam  GENERAL:  82 y.o.-year-old pale appearing patient lying in the bed with no acute distress.  EYES: Pupils equal, round, reactive to light and accommodation. No scleral icterus. Pale conjunctiva Extraocular muscles intact.  HEENT: Head atraumatic, normocephalic. Oropharynx and nasopharynx clear. Dry oral mucosa NECK:  Supple, no jugular venous distention. No thyroid enlargement, no tenderness.  LUNGS: Normal breath sounds bilaterally, no wheezing, rales, rhonchi. No use of accessory muscles of respiration.  CARDIOVASCULAR: S1, S2 irregular and tachycardia. No murmurs, rubs, or gallops.  ABDOMEN: Soft, nontender, nondistended. Bowel sounds present.  No organomegaly or mass.  EXTREMITIES: No cyanosis, clubbing. Bilateral chronic leg edema.    NEUROLOGIC: Cranial nerves II through XII are intact. No focal Motor or sensory deficits b/l. Globally weak  PSYCHIATRIC: The patient is alert and oriented x 3.  SKIN: No obvious rash, lesion, or ulcer.    LABORATORY PANEL:   CBC  Recent Labs Lab 06/04/15 1059  WBC 6.2  HGB 8.1*  HCT 24.3*  PLT 119*   ------------------------------------------------------------------------------------------------------------------  Chemistries   Recent Labs Lab 05/30/15 1437  06/03/15 0318 06/04/15 0810  NA 136  < > 139 140  K 3.8  < > 4.2 3.5  CL 99*  < > 115* 115*  CO2 32  < > 22 25  GLUCOSE 146*  < > 111* 87  BUN 22*  < > 21* 13  CREATININE 1.05  < > 0.71 0.67  CALCIUM 8.0*  < > 6.3* 6.3*  MG  --   < > 2.0  --   AST 27  --   --   --   ALT 18  --   --   --   ALKPHOS 94  --   --   --   BILITOT 0.8  --   --   --   < > = values in this interval not displayed. ------------------------------------------------------------------------------------------------------------------  Cardiac Enzymes  Recent Labs Lab 05/30/15 1437  TROPONINI <0.03   ------------------------------------------------------------------------------------------------------------------  RADIOLOGY:  No results found.   ASSESSMENT AND PLAN:   82 year old male with metastatic prostate cancer, recent GI bleed secondary to duodenal ulcer, chronic atrial fibrillation, hyperlipidemia, COPD who presents to the hospital due to rectal bleeding.  #1 GI bleed-suspected  to be a lower GI bleed given his rectal bleeding. Patient was recently diagnosed with a duodenal ulcer although. -Status post total 6 units of packed red blood cells and hemoglobin is 8.4 this am.. Active bleeding of duodenal ulcer seen on EGD yesterday.  Per Dr. Gustavo Lah, Protonix drip for 3 days then change to twice a day;  He will need to be on a twice a  day PPI at home;  Continue to hold anticoagulation for a at least week to 10 days started clear liquid diet yesterday no red no carbonation. Would continue that for another day before advancing to full liquids for 2-3 days before advancing to soft. F/u Hb.  Per Dr. Tiffany Kocher, NPO and stat GI bleeding scan due to bloody stool again today. Repeat Hb 8.1.  #2 acute blood loss anemia-secondary to the GI bleed. Status post total 6 units of packed red blood cells and hemoglobin is 8.1 just now. -Continue transfusions as needed. Follow hemoglobin q6h.  #3 COPD-no acute exacerbation. Continue Dulera, Spiriva. -Continue maintenance prednisone.  #4 Atrial fibrillation with RVR.   metoprolol was stopped due to hypotension. Hold aspirin given the GI bleed. Heart rate is controlled, continue cardizem drip and IV lopressor per Dr. Josefa Half.  #5 hypotension-secondary to volume loss from the GI bleed. Improved. -Continue IV fluids.   #6 history of diastolic CHF, ejection fraction 55-60%.-clinically patient is not in congestive heart failure. Hold Lasix, beta blocker given the hypotension.  *Hypokalemia. Improved with NS with KCl. * Hypomagnesemia. Improved with Mag iv.   Discussed with Dr. Tiffany Kocher and Dr. Saralyn Pilar. All the records are reviewed and case discussed with Care Management/Social Workerr. Management plans discussed with the patient, his daughter and they are in agreement.  CODE STATUS: DO NOT RESUSCITATE  DVT Prophylaxis: Teds and SCDs  TOTAL  TIME TAKING CARE OF THIS PATIENT: 45 minutes.   POSSIBLE D/C IN 3 DAYS, DEPENDING ON CLINICAL CONDITION.   Demetrios Loll M.D on 06/04/2015 at 1:17 PM  Between 7am to 6pm - Pager - [phone]  After 6pm go to [email or web address] - password [email or web address] [facility]  [place] [facility]  Office  [phone]  CC: Primary care physician; No primary care provider on file.

## 2015-06-04 NOTE — Progress Notes (Addendum)
MD Oseni aware of new hemoglobin results,okay per MD, to hold Metoprolol due to low BP. Will continue to monitor. No new orders received.

## 2015-06-04 NOTE — Progress Notes (Signed)
Initial Nutrition Assessment  DOCUMENTATION CODES:   Obesity unspecified  INTERVENTION:  -Monitor diet progression -Pt may benefit from boost breeze or ensure enlive for added nutrition   NUTRITION DIAGNOSIS:   Inadequate oral intake related to altered GI function as evidenced by NPO status.    GOAL:   Patient will meet greater than or equal to 90% of their needs    MONITOR:   PO intake, Diet advancement, Labs  REASON FOR ASSESSMENT:   Low Braden    ASSESSMENT:   82 y/o male admitted with GI bleeding, anemia. Recent diagnosis of duodonal ulcer  Past Medical History  Diagnosis Date  . COPD (chronic obstructive pulmonary disease) ([place])   . Cancer [facility])     Prostate with bone mets-current chemo treatment  . Hyperlipidemia   . Atrial fibrillation ([place])     Pt out of room for stat GI bleeding scan this am during rounds. Wife at bedside and reports pt tolerating liquids (100%). Limited intake day 6 of admission NPO/CL  Wife reports normal intake prior to admission  Medication reviewed, protonix Labs reviewed: Hgb 8.1  Diet Order:  Diet NPO time specified Except for: Sips with Meds  Skin:   (left leg necrotic wound)  Last BM:  4/01 bloody  Height:   Ht Readings from Last 1 Encounters:  05/30/15 6\' 1"  (1.854 m)    Weight:   Wt Readings from Last 1 Encounters:  06/02/15 230 lb 6.1 oz (104.5 kg)    Ideal Body Weight:     BMI:  Body mass index is 30.4 kg/(m^2).  Estimated Nutritional Needs:   Kcal:  1788-2000 kcals/d  Protein:  104-124g/d  Fluid:  1.7-2.0 L/d  EDUCATION NEEDS:   No education needs identified at this time  Icie Kuznicki B. Zenia Resides, [place], [place] (pager) Weekend/On-Call pager [phone])

## 2015-06-04 NOTE — Consult Note (Signed)
Patient with passage of marroon dark blood today, hgb 8, was 8.4.  Abd not tender, no hematemesis, no worsening SOB.  Abd not tender, no masses.  Will get stat GI bleeding scan and order repeat CBC stat.  He had a duodenal ulcer with pigmented yellow  escar on it.  Had a polyp removed few days ago from colon and this could be the source also.

## 2015-06-04 NOTE — Progress Notes (Signed)
[facility] Cardiology  SUBJECTIVE: I had another maroon stool   Filed Vitals:   06/03/15 1923 06/04/15 0429 06/04/15 0800 06/04/15 0812  BP: 108/62 121/66 98/60   Pulse: 90 121 115 85  Temp:  97.8 F (36.6 C) 97.5 F (36.4 C)   TempSrc:  Oral Oral   Resp:  18 18   Height:      Weight:      SpO2: 96% 90% 96% 94%     Intake/Output Summary (Last 24 hours) at 06/04/15 1024 Last data filed at 06/04/15 0859  Gross per 24 hour  Intake 2132.08 ml  Output    600 ml  Net 1532.08 ml      PHYSICAL EXAM  General: Well developed, well nourished, in no acute distress HEENT:  Normocephalic and atramatic Neck:  No JVD.  Lungs: Clear bilaterally to auscultation and percussion. Heart: HRRR . Normal S1 and S2 without gallops or murmurs.  Abdomen: Bowel sounds are positive, abdomen soft and non-tender  Msk:  Back normal, normal gait. Normal strength and tone for age. Extremities: No clubbing, cyanosis or edema.   Neuro: Alert and oriented X 3. Psych:  Good affect, responds appropriately   LABS: Basic Metabolic Panel:  Recent Labs  06/02/15 0742 06/03/15 0318 06/04/15 0810  NA 140 139 140  K 4.6 4.2 3.5  CL 115* 115* 115*  CO2 23 22 25   GLUCOSE 107* 111* 87  BUN 24* 21* 13  CREATININE 0.81 0.71 0.67  CALCIUM 6.4* 6.3* 6.3*  MG 1.6* 2.0  --    Liver Function Tests: No results for input(s): AST, ALT, ALKPHOS, BILITOT, PROT, ALBUMIN in the last 72 hours. No results for input(s): LIPASE, AMYLASE in the last 72 hours. CBC:  Recent Labs  06/03/15 0820 06/04/15 0810  WBC 7.4 6.3  HGB 8.4* 8.0*  HCT 25.9* 24.2*  MCV 93.6 95.3  PLT 117* 120*   Cardiac Enzymes: No results for input(s): CKTOTAL, CKMB, CKMBINDEX, TROPONINI in the last 72 hours. BNP: Invalid input(s): POCBNP D-Dimer: No results for input(s): DDIMER in the last 72 hours. Hemoglobin A1C: No results for input(s): HGBA1C in the last 72 hours. Fasting Lipid Panel: No results for input(s): CHOL, HDL, LDLCALC,  TRIG, CHOLHDL, LDLDIRECT in the last 72 hours. Thyroid Function Tests: No results for input(s): TSH, T4TOTAL, T3FREE, THYROIDAB in the last 72 hours.  Invalid input(s): FREET3 Anemia Panel: No results for input(s): VITAMINB12, FOLATE, FERRITIN, TIBC, IRON, RETICCTPCT in the last 72 hours.  No results found.   Echo normal left ventricular function with LV ejection fraction of 55-60%  TELEMETRY: Atrial fibrillation with heart rate 110-125 BPM:  ASSESSMENT AND PLAN:  Principal Problem:   GIB (gastrointestinal bleeding) Active Problems:   Anemia    1. Atrial fibrillation, with rapid ventricular rate, exacerbated by underlying GI bleed, receiving Lopressor 2.5 mg IV every 6 hours, currently not on diltiazem drip unknown reason 2. GI bleed, unremarkable upper endoscopy, with recurrent hernia stool  Recommendations  1. Continue IV Lopressor Q6h 2. Cardizem 10 mg IV push, followed by diltiazem drip 10 mg per hour 3. Defer chronic anticoagulation 4. Await GI recommendations, probable bleeding scan pending    Kayn Haymore, MD, PhD, [facility] 06/04/2015 10:24 AM

## 2015-06-04 NOTE — Consult Note (Signed)
Patient bleeding scan was neg, likely old blood moving through.  This correlates with hgb result.

## 2015-06-05 LAB — HEMOGLOBIN
HEMOGLOBIN: 7.7 g/dL — AB (ref 13.0–18.0)
HEMOGLOBIN: 8 g/dL — AB (ref 13.0–18.0)
Hemoglobin: 8.3 g/dL — ABNORMAL LOW (ref 13.0–18.0)

## 2015-06-05 LAB — CALCIUM: Calcium: 6.6 mg/dL — ABNORMAL LOW (ref 8.9–10.3)

## 2015-06-05 MED ORDER — METOPROLOL TARTRATE 25 MG PO TABS
25.0000 mg | ORAL_TABLET | Freq: Four times a day (QID) | ORAL | Status: DC
  Administered 2015-06-05 – 2015-06-06 (×3): 25 mg via ORAL
  Filled 2015-06-05 (×3): qty 1

## 2015-06-05 MED ORDER — DILTIAZEM HCL 60 MG PO TABS
60.0000 mg | ORAL_TABLET | Freq: Four times a day (QID) | ORAL | Status: DC
  Administered 2015-06-05 – 2015-06-06 (×3): 60 mg via ORAL
  Filled 2015-06-05 (×3): qty 1

## 2015-06-05 NOTE — Consult Note (Signed)
GI Inpatient Follow-up Note  Patient Identification: Chad Kirby is a 82 y.o. male with anemia / rectal bleeding.   Subjective: Patient reports he is doing well.  He has not had another bowel movement since yesterday, does report he "feels like he could have one."  He has no GI complaints at this time.  Scheduled Inpatient Medications:  . sodium chloride   Intravenous Once  . ammonium lactate   Topical Daily  . antiseptic oral rinse  7 mL Mouth Rinse q12n4p  . chlorhexidine  15 mL Mouth Rinse BID  . collagenase  1 application Topical BID  . diltiazem  60 mg Oral 4 times per day  . gabapentin  300 mg Oral BID  . [START ON 10/12/2015] Leuprolide Acetate (6 Month)  45 mg Intramuscular Q6 months  . metoprolol tartrate  25 mg Oral QID  . mometasone-formoterol  2 puff Inhalation BID  . predniSONE  10 mg Oral Daily  . tiotropium  18 mcg Inhalation Daily    Continuous Inpatient Infusions:   . 0.9 % NaCl with KCl 20 mEq / L 75 mL/hr at 06/04/15 1838  . pantoprozole (PROTONIX) infusion 8 mg/hr (06/05/15 0553)    PRN Inpatient Medications:  acetaminophen **OR** acetaminophen, albuterol, LORazepam, ondansetron **OR** ondansetron (ZOFRAN) IV, sodium chloride, technetium labeled red blood cells, traZODone  Review of Systems: Constitutional: Weight is stable.  Eyes: No changes in vision. ENT: No oral lesions, sore throat.  GI: see HPI.  Heme/Lymph: No easy bruising.  CV: No chest pain.  GU: No hematuria.  Integumentary: No rashes.  Neuro: No headaches.  Psych: No depression/anxiety.  Endocrine: No heat/cold intolerance.  Allergic/Immunologic: No urticaria.  Resp: No cough, SOB.  Musculoskeletal: No joint swelling.    Physical Examination: BP 115/55 mmHg  Pulse 111  Temp(Src) 97.7 F (36.5 C) (Oral)  Resp 20  Ht 6\' 1"  (1.854 m)  Wt 104.5 kg (230 lb 6.1 oz)  BMI 30.40 kg/m2  SpO2 91% Gen: NAD, pale, alert and oriented x 4, family is bedside. HEENT: PEERLA, EOMI, Neck:  supple, no JVD or thyromegaly Chest: CTA bilaterally, no wheezes, crackles, or other adventitious sounds CV: tachycardia noted Abd: soft, NT, ND, +BS in all four quadrants; no HSM, guarding, ridigity, or rebound tenderness Ext: no edema, well perfused with 2+ pulses, Skin: no rash or lesions noted Lymph: no LAD  Data: Lab Results  Component Value Date   WBC 6.2 06/04/2015   HGB 8.3* 06/05/2015   HCT 24.3* 06/04/2015   MCV 94.1 06/04/2015   PLT 119* 06/04/2015    Recent Labs Lab 06/04/15 1943 06/05/15 0412 06/05/15 1157  HGB 7.9* 7.7* 8.3*   Lab Results  Component Value Date   NA 140 06/04/2015   K 3.5 06/04/2015   CL 115* 06/04/2015   CO2 25 06/04/2015   BUN 13 06/04/2015   CREATININE 0.67 06/04/2015   Lab Results  Component Value Date   ALT 18 05/30/2015   AST 27 05/30/2015   ALKPHOS 94 05/30/2015   BILITOT 0.8 05/30/2015    Recent Labs Lab 06/01/15 1237  INR 1.12   Imaging:  CLINICAL DATA: Maroon stools for couple days, fall hemoglobin, transfuse 6 units of blood on 06/02/2015, history prostate cancer and recent colonic polypectomy, atrial fibrillation, COPD  EXAM: NUCLEAR MEDICINE GASTROINTESTINAL BLEEDING SCAN  TECHNIQUE: Sequential abdominal images were obtained for 2 hours following intravenous administration of Tc-66m labeled red blood cells.  RADIOPHARMACEUTICALS: 19.33 mCi Tc-35m in-vitro labeled autologous red cells.  COMPARISON: None  FINDINGS: Normal blood pool distribution of tracer.  Expected excretion of de labeled tracer into urinary bladder.  No abnormal gastrointestinal localization of tracer identified to indicate active GI bleeding.  IMPRESSION: Negative GI bleeding scan through 2 hours following injection.   Electronically Signed  By: Lavonia Dana M.D.  On: 06/04/2015 14:39  Assessment/Plan: Mr. Quander is a 82 y.o. male with anemia/ rectal bleeding, s/p EGD that showed oozing cratered duodenal ulcer.   Bleeding scan completed yesterday was negative. Hgb 8.3.  On Protonix drip.    Recommendations: No new GI recommendations at this time.  We will continue to follow with you. Please call with questions or concerns.  Salvadore Farber, PA-C  I personally performed these services.

## 2015-06-05 NOTE — Progress Notes (Signed)
Patient has been complaining of a lot of left leg pain today. Given tylenol with no improvement. Paged Dr. Bridgett Larsson and MD stated patient can only have tylenol due to BP running soft and patient needs to be able to take his cardiac meds. Patient is resting okay now with his leg propped up on a pillow.

## 2015-06-05 NOTE — Progress Notes (Signed)
PT Cancellation Note  Patient Details Name: Chad Kirby: [id number] DOB: [birth year]   Cancelled Treatment:    Reason Eval/Treat Not Completed: Patient not medically ready Spoke with pt and his wife, they do not feel that he is ready/able to work with PT today.  Apparently pt has been at rehab multiple times recently but has not been able to walk in 6 months.  This AM pt's Hgb was down (7.7) but is does seem to be coming up with recent labs, HR is fluctuating 100-120 at rest.  Will hold PT today and try back when appropriate.   Wayne Both, PT, DPT [phone]  Kreg Shropshire 06/05/2015, 1:28 PM

## 2015-06-05 NOTE — Progress Notes (Signed)
[facility] Cardiology  SUBJECTIVE: I don't have chest pain   Filed Vitals:   06/04/15 2107 06/05/15 0037 06/05/15 0504 06/05/15 0827  BP: 95/61 90/42 105/55 100/62  Pulse: 85 99 106 101  Temp:   97.7 F (36.5 C)   TempSrc:   Oral   Resp:  20 16 20   Height:      Weight:      SpO2:  93% 96% 96%     Intake/Output Summary (Last 24 hours) at 06/05/15 1120 Last data filed at 06/05/15 1115  Gross per 24 hour  Intake    760 ml  Output   1025 ml  Net   -265 ml      PHYSICAL EXAM  General: Well developed, well nourished, in no acute distress HEENT:  Normocephalic and atramatic Neck:  No JVD.  Lungs: Clear bilaterally to auscultation and percussion. Heart: HRRR . Normal S1 and S2 without gallops or murmurs.  Abdomen: Bowel sounds are positive, abdomen soft and non-tender  Msk:  Back normal, normal gait. Normal strength and tone for age. Extremities: No clubbing, cyanosis or edema.   Neuro: Alert and oriented X 3. Psych:  Good affect, responds appropriately   LABS: Basic Metabolic Panel:  Recent Labs  06/03/15 0318 06/04/15 0810 06/05/15 0416  NA 139 140  --   K 4.2 3.5  --   CL 115* 115*  --   CO2 22 25  --   GLUCOSE 111* 87  --   BUN 21* 13  --   CREATININE 0.71 0.67  --   CALCIUM 6.3* 6.3* 6.6*  MG 2.0  --   --    Liver Function Tests: No results for input(s): AST, ALT, ALKPHOS, BILITOT, PROT, ALBUMIN in the last 72 hours. No results for input(s): LIPASE, AMYLASE in the last 72 hours. CBC:  Recent Labs  06/04/15 0810 06/04/15 1059 06/04/15 1943 06/05/15 0412  WBC 6.3 6.2  --   --   HGB 8.0* 8.1* 7.9* 7.7*  HCT 24.2* 24.3*  --   --   MCV 95.3 94.1  --   --   PLT 120* 119*  --   --    Cardiac Enzymes: No results for input(s): CKTOTAL, CKMB, CKMBINDEX, TROPONINI in the last 72 hours. BNP: Invalid input(s): POCBNP D-Dimer: No results for input(s): DDIMER in the last 72 hours. Hemoglobin A1C: No results for input(s): HGBA1C in the last 72 hours. Fasting  Lipid Panel: No results for input(s): CHOL, HDL, LDLCALC, TRIG, CHOLHDL, LDLDIRECT in the last 72 hours. Thyroid Function Tests: No results for input(s): TSH, T4TOTAL, T3FREE, THYROIDAB in the last 72 hours.  Invalid input(s): FREET3 Anemia Panel: No results for input(s): VITAMINB12, FOLATE, FERRITIN, TIBC, IRON, RETICCTPCT in the last 72 hours.  Nm Gi Blood Loss  06/04/2015  CLINICAL DATA:  Maroon stools for couple days, fall hemoglobin, transfuse 6 units of blood on 06/02/2015, history prostate cancer and recent colonic polypectomy, atrial fibrillation, COPD EXAM: NUCLEAR MEDICINE GASTROINTESTINAL BLEEDING SCAN TECHNIQUE: Sequential abdominal images were obtained for 2 hours following intravenous administration of Tc-88m labeled red blood cells. RADIOPHARMACEUTICALS:  19.33 mCi Tc-41m in-vitro labeled autologous red cells. COMPARISON:  None FINDINGS: Normal blood pool distribution of tracer. Expected excretion of de labeled tracer into urinary bladder. No abnormal gastrointestinal localization of tracer identified to indicate active GI bleeding. IMPRESSION: Negative GI bleeding scan through 2 hours following injection. Electronically Signed   By: Lavonia Dana M.D.   On: 06/04/2015 14:39  Echo normal left ventricular function  TELEMETRY: Atrial fibrillation:  ASSESSMENT AND PLAN:  Principal Problem:   GIB (gastrointestinal bleeding) Active Problems:   Anemia    1. Atrial fibrillation, with rapid ventricular rate, exacerbated by underlying GI bleed and anemia 2. GI bleed, unremarkable upper endoscopy, ongoing anemia, currently on full liquid diet  Recommendations  1. Convert IV Lopressor to by mouth 2. Convert IV diltiazem to by mouth 3. Defer chronic anticoagulation   Hunter Pinkard, MD, PhD, [facility] 06/05/2015 11:20 AM

## 2015-06-05 NOTE — Progress Notes (Addendum)
Notified Dr. Saralyn Pilar that BP is currently 89/52 in the right arm and 84/49 in the left arm. Heart rate has been in the 80's for the last couple of hours. Patient states he feels no different and "feels fine". MD acknowledged, no new orders at this time. Will closely monitor.

## 2015-06-05 NOTE — Progress Notes (Signed)
[facility] NAME: Chad Kirby    MR#:  [id number]  DATE OF BIRTH:  December 15, 1929  SUBJECTIVE:   Patient here due to rectal bleeding and likely a lower GI bleed.   No melena or bloody stool. Still Weakness.  Status post EGD. Heart rate is controlled with Cardizem drip. REVIEW OF SYSTEMS:    Review of Systems  Constitutional: Negative for fever and chills.  HENT: Negative for congestion and tinnitus.   Eyes: Negative for blurred vision and double vision.  Respiratory: Negative for cough, shortness of breath and wheezing.   Cardiovascular: Negative for chest pain, orthopnea and PND.  Gastrointestinal: Negative for nausea, vomiting, abdominal pain and diarrhea.  marroon dark blood stool. Genitourinary: Negative for dysuria and hematuria.  Neurological: Positive for weakness (generalized). Negative for dizziness, sensory change and focal weakness.  All other systems reviewed and are negative.   DRUG ALLERGIES:   Allergies  Allergen Reactions  . Latex Rash    VITALS:  Blood pressure 100/62, pulse 101, temperature 97.7 F (36.5 C), temperature source Oral, resp. rate 20, height 6\' 1"  (1.854 m), weight 104.5 kg (230 lb 6.1 oz), SpO2 96 %.  PHYSICAL EXAMINATION:   Physical Exam  GENERAL:  82 y.o.-year-old pale appearing patient lying in the bed with no acute distress.  EYES: Pupils equal, round, reactive to light and accommodation. No scleral icterus. Pale conjunctiva Extraocular muscles intact.  HEENT: Head atraumatic, normocephalic. Oropharynx and nasopharynx clear. Dry oral mucosa NECK:  Supple, no jugular venous distention. No thyroid enlargement, no tenderness.  LUNGS: Normal breath sounds bilaterally, no wheezing, rales, rhonchi. No use of accessory muscles of respiration.  CARDIOVASCULAR: S1, S2 irregular and tachycardia. No murmurs, rubs, or gallops.  ABDOMEN: Soft, nontender, nondistended. Bowel sounds present.  No organomegaly or mass.  EXTREMITIES: No cyanosis, clubbing. Bilateral chronic leg edema.    NEUROLOGIC: Cranial nerves II through XII are intact. No focal Motor or sensory deficits b/l. Globally weak  PSYCHIATRIC: The patient is alert and oriented x 3.  SKIN: No obvious rash, lesion, or ulcer.    LABORATORY PANEL:   CBC  Recent Labs Lab 06/04/15 1059  06/05/15 0412  WBC 6.2  --   --   HGB 8.1*  < > 7.7*  HCT 24.3*  --   --   PLT 119*  --   --   < > = values in this interval not displayed. ------------------------------------------------------------------------------------------------------------------  Chemistries   Recent Labs Lab 05/30/15 1437  06/03/15 0318 06/04/15 0810 06/05/15 0416  NA 136  < > 139 140  --   K 3.8  < > 4.2 3.5  --   CL 99*  < > 115* 115*  --   CO2 32  < > 22 25  --   GLUCOSE 146*  < > 111* 87  --   BUN 22*  < > 21* 13  --   CREATININE 1.05  < > 0.71 0.67  --   CALCIUM 8.0*  < > 6.3* 6.3* 6.6*  MG  --   < > 2.0  --   --   AST 27  --   --   --   --   ALT 18  --   --   --   --   ALKPHOS 94  --   --   --   --   BILITOT 0.8  --   --   --   --   < > =  values in this interval not displayed. ------------------------------------------------------------------------------------------------------------------  Cardiac Enzymes  Recent Labs Lab 05/30/15 1437  TROPONINI <0.03   ------------------------------------------------------------------------------------------------------------------  RADIOLOGY:  Nm Gi Blood Loss  06/04/2015  CLINICAL DATA:  Maroon stools for couple days, fall hemoglobin, transfuse 6 units of blood on 06/02/2015, history prostate cancer and recent colonic polypectomy, atrial fibrillation, COPD EXAM: NUCLEAR MEDICINE GASTROINTESTINAL BLEEDING SCAN TECHNIQUE: Sequential abdominal images were obtained for 2 hours following intravenous administration of Tc-57m labeled red blood cells. RADIOPHARMACEUTICALS:  19.33 mCi Tc-15m in-vitro  labeled autologous red cells. COMPARISON:  None FINDINGS: Normal blood pool distribution of tracer. Expected excretion of de labeled tracer into urinary bladder. No abnormal gastrointestinal localization of tracer identified to indicate active GI bleeding. IMPRESSION: Negative GI bleeding scan through 2 hours following injection. Electronically Signed   By: Lavonia Dana M.D.   On: 06/04/2015 14:39     ASSESSMENT AND PLAN:   82 year old male with metastatic prostate cancer, recent GI bleed secondary to duodenal ulcer, chronic atrial fibrillation, hyperlipidemia, COPD who presents to the hospital due to rectal bleeding.  #1 GI bleed-suspected to be a lower GI bleed given his rectal bleeding. Patient was recently diagnosed with a duodenal ulcer although. -Status post total 6 units of packed red blood cells and hemoglobin is 8.4 this am.. Active bleeding of duodenal ulcer seen on EGD yesterday.  Per Dr. Gustavo Lah, Protonix drip for 3 days then change to twice a day;  He will need to be on a twice a day PPI at home;  Continue to hold anticoagulation for a at least week to 10 days started clear liquid diet yesterday no red no carbonation. Would continue that for another day before advancing to full liquids for 2-3 days before advancing to soft. F/u Hb. He had marroon dark blood stool yesterday, marroon dark blood stool, but GI bleeding scan is normal. Repeated Hb 8.1, likely old blood moving through.  Start full liquid diet today.  #2 acute blood loss anemia-secondary to the GI bleed. Status post total 6 units of packed red blood cells and hemoglobin is 7.7. -Continue transfusions as needed. Follow hemoglobin in am.  #3 COPD-no acute exacerbation. Continue Dulera, Spiriva. -Continue maintenance prednisone.  #4 Atrial fibrillation with RVR.   metoprolol was stopped due to hypotension. Hold aspirin given the GI bleed. Heart rate is controlled, continue cardizem drip and IV lopressor per Dr.  Josefa Half.  #5 hypotension-secondary to volume loss from the GI bleed. Improved. -Continue IV fluids.   #6 history of diastolic CHF, ejection fraction 55-60%.-clinically patient is not in congestive heart failure. Hold Lasix, beta blocker given the hypotension.  *Hypokalemia. Improved with NS with KCl. * Hypomagnesemia. Improved with Mag iv.   Discussed with Dr. Tiffany Kocher and Dr. Saralyn Pilar. All the records are reviewed and case discussed with Care Management/Social Workerr. Management plans discussed with the patient, his daughter and they are in agreement.  CODE STATUS: DO NOT RESUSCITATE  DVT Prophylaxis: Teds and SCDs  TOTAL  TIME TAKING CARE OF THIS PATIENT: 38 minutes.   POSSIBLE D/C IN 2-3 DAYS, DEPENDING ON CLINICAL CONDITION.   Demetrios Loll M.D on 06/05/2015 at 10:07 AM  Between 7am to 6pm - Pager - [phone]  After 6pm go to [email or web address] - password [email or web address] [facility]  [facility]  Office  [phone]  CC: Primary care physician; No primary care provider on file.

## 2015-06-05 NOTE — Progress Notes (Signed)
Patient had another large bowel movement that was maroon in color. Dr. Tiffany Kocher on the floor and notified. Hemoglobin came back at 8.0. Will continue to monitor.

## 2015-06-05 NOTE — Consult Note (Signed)
Pt denies any further bleeding, hgb 7.7 and repeat 6 hours later is 8.3, variation not unusual. He has had total of 6 unites of pRBC.  At fib rate 110. Irreg.  No further plans at this time.  Follow hgb.

## 2015-06-06 LAB — BASIC METABOLIC PANEL
Anion gap: 2 — ABNORMAL LOW (ref 5–15)
BUN: 7 mg/dL (ref 6–20)
CALCIUM: 6.9 mg/dL — AB (ref 8.9–10.3)
CO2: 25 mmol/L (ref 22–32)
CREATININE: 0.81 mg/dL (ref 0.61–1.24)
Chloride: 112 mmol/L — ABNORMAL HIGH (ref 101–111)
GFR calc Af Amer: >60 mL/min (ref >60)
GFR calc non Af Amer: >60 mL/min (ref >60)
GLUCOSE: 92 mg/dL (ref 65–99)
Potassium: 3.5 mmol/L (ref 3.5–5.1)
Sodium: 139 mmol/L (ref 135–145)

## 2015-06-06 LAB — CBC
HEMATOCRIT: 24.6 % — AB (ref 40.0–52.0)
Hemoglobin: 8.2 g/dL — ABNORMAL LOW (ref 13.0–18.0)
MCH: 31.4 pg (ref 26.0–34.0)
MCHC: 33.2 g/dL (ref 32.0–36.0)
MCV: 94.4 fL (ref 80.0–100.0)
PLATELETS: 132 10*3/uL — AB (ref 150–440)
RBC: 2.6 MIL/uL — ABNORMAL LOW (ref 4.40–5.90)
RDW: 22.3 % — AB (ref 11.5–14.5)
WBC: 5.6 10*3/uL (ref 3.8–10.6)

## 2015-06-06 LAB — ALBUMIN: Albumin: 2.1 g/dL — ABNORMAL LOW (ref 3.5–5.0)

## 2015-06-06 LAB — MAGNESIUM: Magnesium: 1.9 mg/dL (ref 1.7–2.4)

## 2015-06-06 MED ORDER — METOPROLOL TARTRATE 25 MG PO TABS
25.0000 mg | ORAL_TABLET | Freq: Four times a day (QID) | ORAL | Status: DC
  Administered 2015-06-06 – 2015-06-09 (×6): 25 mg via ORAL
  Filled 2015-06-06 (×10): qty 1

## 2015-06-06 MED ORDER — DILTIAZEM HCL 60 MG PO TABS
60.0000 mg | ORAL_TABLET | Freq: Four times a day (QID) | ORAL | Status: DC
  Administered 2015-06-06 – 2015-06-08 (×9): 60 mg via ORAL
  Filled 2015-06-06 (×9): qty 1

## 2015-06-06 MED ORDER — ENSURE ENLIVE PO LIQD
237.0000 mL | Freq: Three times a day (TID) | ORAL | Status: DC
Start: 2015-06-06 — End: 2015-06-10
  Administered 2015-06-06 – 2015-06-10 (×12): 237 mL via ORAL

## 2015-06-06 MED ORDER — PANTOPRAZOLE SODIUM 40 MG IV SOLR
40.0000 mg | Freq: Two times a day (BID) | INTRAVENOUS | Status: DC
  Administered 2015-06-06: 40 mg via INTRAVENOUS
  Filled 2015-06-06: qty 40

## 2015-06-06 MED ORDER — FUROSEMIDE 10 MG/ML IJ SOLN
10.0000 mg/h | INTRAMUSCULAR | Status: AC
  Administered 2015-06-06: 10 mg/h via INTRAVENOUS
  Filled 2015-06-06: qty 25

## 2015-06-06 NOTE — Progress Notes (Signed)
Nutrition Follow-up  DOCUMENTATION CODES:   Obesity unspecified  INTERVENTION:   Medical Food Supplement Therapy: recommend addition of Ensure Enlive po TID, each supplement provides 350 kcal and 20 grams of protein Meals and Snacks: Cater to patient preferences, discussed options while on FL diet. Also discussed importance of protein intake at each meal and discussed options once diet advanced to Soft.   NUTRITION DIAGNOSIS:   Inadequate oral intake related to altered GI function as evidenced by NPO status.  GOAL:   Patient will meet greater than or equal to 90% of their needs  MONITOR:   PO intake, Diet advancement, Labs  REASON FOR ASSESSMENT:   Consult Assessment of nutrition requirement/status  ASSESSMENT:   82 y/o male admitted with GI bleeding, anemia. Recent diagnosis of duodonal ulcer  Pt very weak, generalized anasarca  Diet Order:  Diet full liquid Room service appropriate?: Yes; Fluid consistency:: Thin   Energy Intake: tolerating FL diet, recorded po intake 100% of meals  Food and Nutrition Related History: pt reports good appetite prior to admission, eating well. Eating 2 good meals per day and supplementing with Ensure  Skin:   (left leg necrotic wound)  Last BM:  06/05/15    Recent Labs Lab 06/02/15 0742 06/03/15 0318 06/04/15 0810 06/05/15 0416 06/06/15 0414  NA 140 139 140  --  139  K 4.6 4.2 3.5  --  3.5  CL 115* 115* 115*  --  112*  CO2 23 22 25   --  25  BUN 24* 21* 13  --  7  CREATININE 0.81 0.71 0.67  --  0.81  CALCIUM 6.4* 6.3* 6.3* 6.6* 6.9*  MG 1.6* 2.0  --   --  1.9  GLUCOSE 107* 111* 87  --  92    Nutritional Anemia Profile:  CBC Latest Ref Rng 06/06/2015 06/05/2015 06/05/2015  WBC 3.8 - 10.6 K/uL 5.6 - -  Hemoglobin 13.0 - 18.0 g/dL 8.2(L) 8.0(L) 8.3(L)  Hematocrit 40.0 - 52.0 % 24.6(L) - -  Platelets 150 - 440 K/uL 132(L) - -    Meds: lasix infusion, prednisone  Height:   Ht Readings from Last 1 Encounters:  05/30/15 6'  1" (1.854 m)    Weight:   Wt Readings from Last 1 Encounters:  06/02/15 230 lb 6.1 oz (104.5 kg)    Filed Weights   05/30/15 1434 06/02/15 1000  Weight: 230 lb (104.327 kg) 230 lb 6.1 oz (104.5 kg)    BMI:  Body mass index is 30.4 kg/(m^2).  Estimated Nutritional Needs:   Kcal:  1788-2000 kcals/d  Protein:  104-124g/d  Fluid:  1.7-2.0 L/d  EDUCATION NEEDS:   No education needs identified at this time  [place], [place], [place] [phone] Pager  [phone] Weekend/On-Call Pager

## 2015-06-06 NOTE — Care Management Important Message (Signed)
Important Message  Patient Details  Name: Chad Kirby MRN: [id number] Date of Birth: 09-22-1929   Medicare Important Message Given:  Yes    [name] A Jerred Zaremba 06/06/2015, 2:01 PM

## 2015-06-06 NOTE — Care Management (Signed)
During progression, it was discussed that patient and wife had more or less declined physical therapy evaluation yesterday.  Discussed with physical therapist that need physical therapy evaluation for authorization to go to skilled nursing.  Patient is nearing medial stability

## 2015-06-06 NOTE — Consult Note (Addendum)
Subjective: Patient seen for  GI bleeding.  Last bm last night at 6 pm.  No drop of hgb, possible old blood.   Denies abdominal pain or nausea, tolerating full liquids.   Objective: Vital signs in last 24 hours: Temp:  [97.4 F (36.3 C)-97.9 F (36.6 C)] 97.4 F (36.3 C) (04/03 0604) Pulse Rate:  [81-111] 97 (04/03 0604) Resp:  [16-20] 16 (04/02 1944) BP: (84-115)/(49-65) 106/65 mmHg (04/03 0604) SpO2:  [91 %-100 %] 100 % (04/02 1944) Blood pressure 106/65, pulse 97, temperature 97.4 F (36.3 C), temperature source Oral, resp. rate 16, height 6\' 1"  (1.854 m), weight 104.5 kg (230 lb 6.1 oz), SpO2 100 %.   Intake/Output from previous day: 04/02 0701 - 04/03 0700 In: 1343.5 [P.O.:1000; I.V.:343.5] Out: 1900 [Urine:1900]  Intake/Output this shift:     General appearance:  Elderly male  No distress. Resp:  Cleat to auscultation Cardio:  IRR GI:  Soft, nontender, nondistended, bowel sounds positive. Extremities:     Lab Results: Results for orders placed or performed during the hospital encounter of 05/30/15 (from the past 24 hour(s))  Hemoglobin     Status: Abnormal   Collection Time: 06/05/15 11:57 AM  Result Value Ref Range   Hemoglobin 8.3 (L) 13.0 - 18.0 g/dL  Hemoglobin     Status: Abnormal   Collection Time: 06/05/15  5:30 PM  Result Value Ref Range   Hemoglobin 8.0 (L) 13.0 - 18.0 g/dL  CBC     Status: Abnormal   Collection Time: 06/06/15  4:14 AM  Result Value Ref Range   WBC 5.6 3.8 - 10.6 K/uL   RBC 2.60 (L) 4.40 - 5.90 MIL/uL   Hemoglobin 8.2 (L) 13.0 - 18.0 g/dL   HCT 24.6 (L) 40.0 - 52.0 %   MCV 94.4 80.0 - 100.0 fL   MCH 31.4 26.0 - 34.0 pg   MCHC 33.2 32.0 - 36.0 g/dL   RDW 22.3 (H) 11.5 - 14.5 %   Platelets 132 (L) 150 - 440 K/uL  Basic metabolic panel     Status: Abnormal   Collection Time: 06/06/15  4:14 AM  Result Value Ref Range   Sodium 139 135 - 145 mmol/L   Potassium 3.5 3.5 - 5.1 mmol/L   Chloride 112 (H) 101 - 111 mmol/L   CO2 25 22 -  32 mmol/L   Glucose, Bld 92 65 - 99 mg/dL   BUN 7 6 - 20 mg/dL   Creatinine, Ser 0.81 0.61 - 1.24 mg/dL   Calcium 6.9 (L) 8.9 - 10.3 mg/dL   GFR calc non Af Amer >60 >60 mL/min   GFR calc Af Amer >60 >60 mL/min   Anion gap 2 (L) 5 - 15  Magnesium     Status: None   Collection Time: 06/06/15  4:14 AM  Result Value Ref Range   Magnesium 1.9 1.7 - 2.4 mg/dL      Recent Labs  06/04/15 0810 06/04/15 1059  06/05/15 1157 06/05/15 1730 06/06/15 0414  WBC 6.3 6.2  --   --   --  5.6  HGB 8.0* 8.1*  < > 8.3* 8.0* 8.2*  HCT 24.2* 24.3*  --   --   --  24.6*  PLT 120* 119*  --   --   --  132*  < > = values in this interval not displayed. BMET  Recent Labs  06/04/15 0810 06/05/15 0416 06/06/15 0414  NA 140  --  139  K 3.5  --  3.5  CL 115*  --  112*  CO2 25  --  25  GLUCOSE 87  --  92  BUN 13  --  7  CREATININE 0.67  --  0.81  CALCIUM 6.3* 6.6* 6.9*   LFT No results for input(s): PROT, ALBUMIN, AST, ALT, ALKPHOS, BILITOT, BILIDIR, IBILI in the last 72 hours. PT/INR No results for input(s): LABPROT, INR in the last 72 hours. Hepatitis Panel No results for input(s): HEPBSAG, HCVAB, HEPAIGM, HEPBIGM in the last 72 hours. C-Diff No results for input(s): CDIFFTOX in the last 72 hours. No results for input(s): CDIFFPCR in the last 72 hours.   Studies/Results: Nm Gi Blood Loss  06/04/2015  CLINICAL DATA:  Maroon stools for couple days, fall hemoglobin, transfuse 6 units of blood on 06/02/2015, history prostate cancer and recent colonic polypectomy, atrial fibrillation, COPD EXAM: NUCLEAR MEDICINE GASTROINTESTINAL BLEEDING SCAN TECHNIQUE: Sequential abdominal images were obtained for 2 hours following intravenous administration of Tc-53m labeled red blood cells. RADIOPHARMACEUTICALS:  19.33 mCi Tc-54m in-vitro labeled autologous red cells. COMPARISON:  None FINDINGS: Normal blood pool distribution of tracer. Expected excretion of de labeled tracer into urinary bladder. No abnormal  gastrointestinal localization of tracer identified to indicate active GI bleeding. IMPRESSION: Negative GI bleeding scan through 2 hours following injection. Electronically Signed   By: Lavonia Dana M.D.   On: 06/04/2015 14:39    Scheduled Inpatient Medications:   . sodium chloride   Intravenous Once  . ammonium lactate   Topical Daily  . antiseptic oral rinse  7 mL Mouth Rinse q12n4p  . chlorhexidine  15 mL Mouth Rinse BID  . collagenase  1 application Topical BID  . diltiazem  60 mg Oral 4 times per day  . gabapentin  300 mg Oral BID  . [START ON 10/12/2015] Leuprolide Acetate (6 Month)  45 mg Intramuscular Q6 months  . metoprolol tartrate  25 mg Oral QID  . mometasone-formoterol  2 puff Inhalation BID  . predniSONE  10 mg Oral Daily  . tiotropium  18 mcg Inhalation Daily    Continuous Inpatient Infusions:   . pantoprozole (PROTONIX) infusion 8 mg/hr (06/05/15 1600)    PRN Inpatient Medications:  acetaminophen **OR** acetaminophen, albuterol, LORazepam, ondansetron **OR** ondansetron (ZOFRAN) IV, sodium chloride, technetium labeled red blood cells, traZODone  Miscellaneous:   Assessment:  1) GIB- recurrent from previously noted ulcer site. Stable, some dark stools, however BUN is now normal, probable clearance of old material.    Plan:  1) continue full liquid for today and tomorrow before advancing to soft/ low residue the following day.   Would consider longer term holding of anticoagulation for some time yet, perhaps 2 weeks. Continue bid ppi  Lollie Sails MD 06/06/2015, 8:32 AM

## 2015-06-06 NOTE — Evaluation (Signed)
Physical Therapy Evaluation Patient Details Name: Chad Kirby MRN: [id number] DOB: 09-09-1929 Today's Date: 06/06/2015   History of Present Illness  Chad Kirby is a 82 y.o. male with a known history of Recent GI bleeding, A. fib, COPD and processes cancer with metastasis. Patient has chronic A. fib and was on Coumadin, which was stopped due to recent GI bleeding. Patient got endoscopy which showed duodenal ulcer and colon polyps. He is taking aspirin for his A. fib. He has had rectal bleeding 2 episode since yesterday. He complains of dizziness, generalized weakness and shortness of breath. But he denies any fever, chills, chest pain or abdominal pain. His hemoglobin decreased to 7.3. Pt is currently admitted due to GIB suspected due to duodenal ulcer, acute blood loss anemia, general anasarca, and Afib with RVR. Pt is s/p blood transfusion. No falls in the last 12 months. Prior to recent illnesses starting February 2017 family and pt report that he was an independent ambulator without assistive device and independent with ADLs  Clinical Impression  Pt is highly motivated to participate with physical therapy. He presents with severe weakness due to illness and prolonged bedrest. Up until now PT has been unable to work with patient secondary to medical complications. PT evaluation reveals generalized UE/LE weakness. He requires modA+1 for bed mobility and modA+2 for sit to stand transfers. Attempted side stepping at EOB but pt with LE buckling and collapse. Pt unable to truly ambulate on this date with physical therapy. Pt would benefit from SNF placement at discharge in order to facilitate safe return to prior level of function at home. Pt will benefit from skilled PT services to address deficits in strength, balance, and mobility in order to return to full function at home.     Follow Up Recommendations SNF    Equipment Recommendations  None recommended by PT    Recommendations for Other  Services       Precautions / Restrictions Precautions Precautions: Fall Restrictions Weight Bearing Restrictions: No      Mobility  Bed Mobility Overal bed mobility: Needs Assistance Bed Mobility: Supine to Sit     Supine to sit: Mod assist     General bed mobility comments: Pt requires considerable assist with rolling onto L side and coming upright into sitting. Pt able to maintain static sitting balance with supervision only but intermittently requires reclining back onto elbows due to fatigue  Transfers Overall transfer level: Needs assistance Equipment used: Rolling walker (2 wheeled) Transfers: Sit to/from Stand Sit to Stand: +2 physical assistance;Mod assist Stand pivot transfers: +2 physical assistance;Mod assist       General transfer comment: Cues for proper hand placement and weight shifting. Cues for set-up prior to transfer. Pt actually demonstrates more strength than expected during transfer. Once upright able to maintain balance with CGA only. Attempted small side steps to move up toward [facility] however pt fatigues and legs buckle resulting in return to sitting on bed. Vitals monitored throughout and HR is approximately 100-110 at rest. Increases to 120 with bed mobility and 140 with transfers. Fluctuates throughout session  Ambulation/Gait             General Gait Details: Attempted side steps at EOB with LE buckling and return to sitting. Unable to truly ambulate at this time  Stairs            Wheelchair Mobility    Modified Rankin (Stroke Patients Only)       Balance Overall balance assessment: Needs  assistance Sitting-balance support: Feet supported Sitting balance-Leahy Scale: Fair     Standing balance support: Bilateral upper extremity supported Standing balance-Leahy Scale: Poor                               Pertinent Vitals/Pain Pain Assessment: 0-10 Pain Score: 2  Pain Location: Low back Pain Descriptors /  Indicators: Aching Pain Intervention(s): Monitored during session    Home Living Family/patient expects to be discharged to:: Skilled nursing facility Living Arrangements: Spouse/significant other Available Help at Discharge: Family Type of Home: House Home Access: Stairs to enter Entrance Stairs-Rails: Can reach both Entrance Stairs-Number of Steps: 2 Home Layout: Two level;Able to live on main level with bedroom/bathroom Home Equipment: Walker - 2 wheels (no BSC, no shower chair, no wc, no hospital bed)      Prior Function Level of Independence: Independent with assistive device(s)         Comments: Prior to recent illnesses pt was able to ambulate independently without assistive device     Hand Dominance   Dominant Hand: Right    Extremity/Trunk Assessment   Upper Extremity Assessment: Generalized weakness           Lower Extremity Assessment: Generalized weakness;RLE deficits/detail RLE Deficits / Details: Partial SLR with assist. Full DF/PF. <3/5 hip flexion in sitting. At least 3/5 quads with full LAQ against gravity       Communication   Communication: No difficulties  Cognition Arousal/Alertness: Awake/alert Behavior During Therapy: WFL for tasks assessed/performed Overall Cognitive Status: Within Functional Limits for tasks assessed                      General Comments      Exercises Other Exercises Other Exercises: Educated family about bed exercises that can be performed while admitted. Educated regarding PT progression      Assessment/Plan    PT Assessment Patient needs continued PT services  PT Diagnosis Difficulty walking;Generalized weakness   PT Problem List Decreased strength;Decreased activity tolerance;Decreased balance;Decreased mobility;Decreased knowledge of use of DME;Decreased safety awareness;Cardiopulmonary status limiting activity  PT Treatment Interventions DME instruction;Gait training;Stair training;Therapeutic  activities;Therapeutic exercise;Balance training;Neuromuscular re-education;Patient/family education   PT Goals (Current goals can be found in the Care Plan section) Acute Rehab PT Goals Patient Stated Goal: Return to prior level of function PT Goal Formulation: With patient/family Time For Goal Achievement: 06/20/15 Potential to Achieve Goals: Fair    Frequency Min 2X/week   Barriers to discharge        Co-evaluation               End of Session Equipment Utilized During Treatment: Gait belt;Oxygen Activity Tolerance: Patient limited by fatigue Patient left: with family/visitor present;in bed;with bed alarm set;with call bell/phone within reach;Other (comment) (Deferred up to recliner due to fatigue) Nurse Communication: Mobility status         Time: [id number] PT Time Calculation (min) (ACUTE ONLY): 43 min   Charges:   PT Evaluation $PT Eval High Complexity: 1 Procedure PT Treatments $Therapeutic Activity: 8-22 mins   PT G Codes:       Lyndel Safe Huprich PT, DPT   Huprich,Jason 06/06/2015, 2:46 PM

## 2015-06-06 NOTE — Progress Notes (Signed)
CSW is aware of patient. [employer] reauth will have to be obtained prior to patient returning to a SNF. Patient will require physical therapy to see patient every 48 hours while patient is at [facility] as this is a [employer] requirement in order to provide prior auth for patient going to SNF. PT has not been able to work with patient due to his fluctuation heart rate but is aware that patient needs PT Notes for [facility]. CSW will continue to follow and assist.   Ernest Pine, MSW, [place] Work Department [phone]

## 2015-06-06 NOTE — Progress Notes (Signed)
Patient has rested comfortably today. Complained of leg pain once, relieved with tylenol. No bowel movements today. BP has been stable with taking cardiac meds. Lasix drip running at 10 and patient is tolerating well, good urine output. Arm swelling is decreasing some by keeping patient's arms on pillows. Will continue to monitor.

## 2015-06-06 NOTE — Progress Notes (Signed)
[name] NAME: Chad Kirby    MR#:  [id number]  DATE OF BIRTH:  06/08/1929  SUBJECTIVE:  Had dark maroon stool y'day evening, none since then. very Weak, Heart rate is controlled with Cardizem, very swollen REVIEW OF SYSTEMS:    Review of Systems  Constitutional: Negative for fever and chills.  HENT: Negative for congestion and tinnitus.   Eyes: Negative for blurred vision and double vision.  Respiratory: Negative for cough, shortness of breath and wheezing.   Cardiovascular: Negative for chest pain, orthopnea and PND.  Gastrointestinal: Negative for nausea, vomiting, abdominal pain and diarrhea.  marroon dark blood stool. Genitourinary: Negative for dysuria and hematuria.  Neurological: Positive for weakness (generalized). Negative for dizziness, sensory change and focal weakness.  All other systems reviewed and are negative.   DRUG ALLERGIES:   Allergies  Allergen Reactions  . Latex Rash    VITALS:  Blood pressure 103/51, pulse 103, temperature 97.8 F (36.6 C), temperature source Oral, resp. rate 18, height 6\' 1"  (1.854 m), weight 104.5 kg (230 lb 6.1 oz), SpO2 95 %. PHYSICAL EXAMINATION:   Physical Exam  GENERAL:  82 y.o.-year-old pale appearing patient lying in the bed with no acute distress.  EYES: Pupils equal, round, reactive to light and accommodation. No scleral icterus. Pale conjunctiva Extraocular muscles intact.  HEENT: Head atraumatic, normocephalic. Oropharynx and nasopharynx clear. Dry oral mucosa NECK:  Supple, no jugular venous distention. No thyroid enlargement, no tenderness.  LUNGS: Normal breath sounds bilaterally, no wheezing, rales, rhonchi. No use of accessory muscles of respiration.  CARDIOVASCULAR: S1, S2 irregular and tachycardia. No murmurs, rubs, or gallops.  ABDOMEN: Soft, nontender, nondistended. Bowel sounds present. No organomegaly or mass.  EXTREMITIES: No cyanosis,  clubbing. Generalized Anasarca NEUROLOGIC: Cranial nerves II through XII are intact. No focal Motor or sensory deficits b/l. Globally weak  PSYCHIATRIC: The patient is alert and oriented x 3.  SKIN: No obvious rash, lesion, or ulcer.    LABORATORY PANEL:   CBC  Recent Labs Lab 06/06/15 0414  WBC 5.6  HGB 8.2*  HCT 24.6*  PLT 132*   ------------------------------------------------------------------------------------------------------------------  Chemistries   Recent Labs Lab 05/30/15 1437  06/06/15 0414  NA 136  < > 139  K 3.8  < > 3.5  CL 99*  < > 112*  CO2 32  < > 25  GLUCOSE 146*  < > 92  BUN 22*  < > 7  CREATININE 1.05  < > 0.81  CALCIUM 8.0*  < > 6.9*  MG  --   < > 1.9  AST 27  --   --   ALT 18  --   --   ALKPHOS 94  --   --   BILITOT 0.8  --   --   < > = values in this interval not displayed. ------------------------------------------------------------------------------------------------------------------  Cardiac Enzymes  Recent Labs Lab 05/30/15 1437  TROPONINI <0.03   ------------------------------------------------------------------------------------------------------------------  RADIOLOGY:  Nm Gi Blood Loss  06/04/2015  CLINICAL DATA:  Maroon stools for couple days, fall hemoglobin, transfuse 6 units of blood on 06/02/2015, history prostate cancer and recent colonic polypectomy, atrial fibrillation, COPD EXAM: NUCLEAR MEDICINE GASTROINTESTINAL BLEEDING SCAN TECHNIQUE: Sequential abdominal images were obtained for 2 hours following intravenous administration of Tc-45m labeled red blood cells. RADIOPHARMACEUTICALS:  19.33 mCi Tc-16m in-vitro labeled autologous red cells. COMPARISON:  None FINDINGS: Normal blood pool distribution of tracer. Expected excretion of de labeled tracer into urinary  bladder. No abnormal gastrointestinal localization of tracer identified to indicate active GI bleeding. IMPRESSION: Negative GI bleeding scan through 2 hours  following injection. Electronically Signed   By: Lavonia Dana M.D.   On: 06/04/2015 14:39     ASSESSMENT AND PLAN:   82 year old male with metastatic prostate cancer, recent GI bleed secondary to duodenal ulcer, chronic atrial fibrillation, hyperlipidemia, COPD who presents to the hospital due to rectal bleeding.  #1 GI bleed-suspected due to oozing cratered duodenal ulcer. Bleeding scan on 4/1 was negative -Status post total 6 units of packed red blood cells and hemoglobin is 8.2 this am.. - Per Dr. Gustavo Lah -> CHANGE Protonix to twice a day; He will need to be on a twice a day PPI on D/C Continue to hold anticoagulation for a at least few weeks - tolerating full liquid diet -> advance to soft diet tomorrow or day after as tolerated He had marroon dark blood stool yesterday, Repeated Hb 8.2, likely old blood moving through.   #2 acute blood loss anemia-secondary to the GI bleed. Status post total 6 units of packed red blood cells and hemoglobin is 8.2. -Continue transfusions as needed. Follow hemoglobin in am.  #3 Generalized Anasarca: Likely multifactorial. Protein loss, no/minimal ambulation for last 4-6 weeks and underlying CHF. Will try lasix drip to see if it helps. Will also c/s dietary.  #4 Atrial fibrillation with RVR.   metoprolol as tolerated for HR control (BP is borderline low). Hold aspirin given the GI bleed. Heart rate is controlled, continue cardizem and metoprolol with holding parameters.  #5 hypotension - monitor. stable  #6 history of diastolic CHF, ejection fraction 55-60%.-clinically patient is not in congestive heart failure. LASIX DRIP  *Hypokalemia: MONITOR  * Hypomagnesemia: monitor  * COPD-no acute exacerbation. Continue Dulera, Spiriva. -Continue maintenance prednisone.   All the records are reviewed and case discussed with Care Management/Social Worker. Management plans discussed with the patient, his daughter and they are in agreement.  CODE  STATUS: DO NOT RESUSCITATE  DVT Prophylaxis: Teds and SCDs  TOTAL  TIME TAKING CARE OF THIS PATIENT: 38 minutes.   POSSIBLE D/C IN 2-3 DAYS, DEPENDING ON CLINICAL CONDITION. Benefit from palliative care eval   [facility], [place] Silveria M.D on 06/06/2015 at 11:38 AM  Between 7am to 6pm - Pager - [phone]  After 6pm go to [email or web address] - password [email or web address] [facility]  [place] [facility]  Office  [phone]  CC: Primary care physician; No primary care provider on file.

## 2015-06-07 LAB — BASIC METABOLIC PANEL
ANION GAP: 6 (ref 5–15)
BUN: 10 mg/dL (ref 6–20)
CHLORIDE: 103 mmol/L (ref 101–111)
CO2: 30 mmol/L (ref 22–32)
Calcium: 7.6 mg/dL — ABNORMAL LOW (ref 8.9–10.3)
Creatinine, Ser: 0.91 mg/dL (ref 0.61–1.24)
GFR calc non Af Amer: >60 mL/min (ref >60)
Glucose, Bld: 99 mg/dL (ref 65–99)
Potassium: 2.6 mmol/L — CL (ref 3.5–5.1)
Sodium: 139 mmol/L (ref 135–145)

## 2015-06-07 LAB — CBC
HEMATOCRIT: 28.4 % — AB (ref 40.0–52.0)
HEMOGLOBIN: 9.4 g/dL — AB (ref 13.0–18.0)
MCH: 31.3 pg (ref 26.0–34.0)
MCHC: 33.1 g/dL (ref 32.0–36.0)
MCV: 94.6 fL (ref 80.0–100.0)
Platelets: 146 10*3/uL — ABNORMAL LOW (ref 150–440)
RBC: 3 MIL/uL — ABNORMAL LOW (ref 4.40–5.90)
RDW: 23.4 % — ABNORMAL HIGH (ref 11.5–14.5)
WBC: 5.4 10*3/uL (ref 3.8–10.6)

## 2015-06-07 MED ORDER — FUROSEMIDE 10 MG/ML IJ SOLN
10.0000 mg/h | INTRAVENOUS | Status: DC
  Administered 2015-06-07 (×2): 10 mg/h via INTRAVENOUS
  Filled 2015-06-07: qty 25

## 2015-06-07 MED ORDER — PANTOPRAZOLE SODIUM 40 MG PO TBEC
40.0000 mg | DELAYED_RELEASE_TABLET | Freq: Two times a day (BID) | ORAL | Status: DC
  Administered 2015-06-07 – 2015-06-10 (×7): 40 mg via ORAL
  Filled 2015-06-07 (×7): qty 1

## 2015-06-07 MED ORDER — POTASSIUM CHLORIDE 10 MEQ/100ML IV SOLN
10.0000 meq | INTRAVENOUS | Status: AC
  Administered 2015-06-07 (×4): 10 meq via INTRAVENOUS
  Filled 2015-06-07 (×4): qty 100

## 2015-06-07 MED ORDER — GABAPENTIN 300 MG PO CAPS
300.0000 mg | ORAL_CAPSULE | Freq: Three times a day (TID) | ORAL | Status: DC
  Administered 2015-06-07 – 2015-06-10 (×10): 300 mg via ORAL
  Filled 2015-06-07 (×10): qty 1

## 2015-06-07 MED ORDER — POTASSIUM CHLORIDE CRYS ER 20 MEQ PO TBCR
40.0000 meq | EXTENDED_RELEASE_TABLET | Freq: Two times a day (BID) | ORAL | Status: DC
  Administered 2015-06-07 – 2015-06-10 (×7): 40 meq via ORAL
  Filled 2015-06-07 (×7): qty 2

## 2015-06-07 MED ORDER — POTASSIUM CHLORIDE CRYS ER 20 MEQ PO TBCR
40.0000 meq | EXTENDED_RELEASE_TABLET | Freq: Once | ORAL | Status: AC
  Administered 2015-06-07: 40 meq via ORAL
  Filled 2015-06-07: qty 2

## 2015-06-07 NOTE — Consult Note (Signed)
Subjective: Patient seen for bleeding. No bowel movement since 06/05/2015 at 1716 hrs. Bleeding scan negative that time. Patient denies any nausea vomiting or abdominal pain. He is tolerating full liquid diet currently.  Objective: Vital signs in last 24 hours: Temp:  [97.4 F (36.3 C)-97.8 F (36.6 C)] 97.8 F (36.6 C) (04/04 1116) Pulse Rate:  [79-112] 91 (04/04 1712) Resp:  [18-20] 18 (04/04 1116) BP: (94-109)/(49-67) 107/66 mmHg (04/04 1712) SpO2:  [95 %-97 %] 97 % (04/04 1116) Blood pressure 107/66, pulse 91, temperature 97.8 F (36.6 C), temperature source Oral, resp. rate 18, height 6\' 1"  (1.854 m), weight 104.5 kg (230 lb 6.1 oz), SpO2 97 %.   Intake/Output from previous day: 04/03 0701 - 04/04 0700 In: 1440 [P.O.:1380; I.V.:60] Out: 7850 [Urine:7850]  Intake/Output this shift:     General appearance:  82 year old male no distress Resp:  Clear to auscultation Cardio:  Irregularly irregular GI:  Soft nontender nondistended bowel sounds positive normoactive Extremities:     Lab Results: Results for orders placed or performed during the hospital encounter of 05/30/15 (from the past 24 hour(s))  CBC     Status: Abnormal   Collection Time: 06/07/15  5:54 AM  Result Value Ref Range   WBC 5.4 3.8 - 10.6 K/uL   RBC 3.00 (L) 4.40 - 5.90 MIL/uL   Hemoglobin 9.4 (L) 13.0 - 18.0 g/dL   HCT 28.4 (L) 40.0 - 52.0 %   MCV 94.6 80.0 - 100.0 fL   MCH 31.3 26.0 - 34.0 pg   MCHC 33.1 32.0 - 36.0 g/dL   RDW 23.4 (H) 11.5 - 14.5 %   Platelets 146 (L) 150 - 440 K/uL  Basic metabolic panel     Status: Abnormal   Collection Time: 06/07/15  5:54 AM  Result Value Ref Range   Sodium 139 135 - 145 mmol/L   Potassium 2.6 (LL) 3.5 - 5.1 mmol/L   Chloride 103 101 - 111 mmol/L   CO2 30 22 - 32 mmol/L   Glucose, Bld 99 65 - 99 mg/dL   BUN 10 6 - 20 mg/dL   Creatinine, Ser 0.91 0.61 - 1.24 mg/dL   Calcium 7.6 (L) 8.9 - 10.3 mg/dL   GFR calc non Af Amer >60 >60 mL/min   GFR calc Af Amer  >60 >60 mL/min   Anion gap 6 5 - 15      Recent Labs  06/05/15 1730 06/06/15 0414 06/07/15 0554  WBC  --  5.6 5.4  HGB 8.0* 8.2* 9.4*  HCT  --  24.6* 28.4*  PLT  --  132* 146*   BMET  Recent Labs  06/05/15 0416 06/06/15 0414 06/07/15 0554  NA  --  139 139  K  --  3.5 2.6*  CL  --  112* 103  CO2  --  25 30  GLUCOSE  --  92 99  BUN  --  7 10  CREATININE  --  0.81 0.91  CALCIUM 6.6* 6.9* 7.6*   LFT  Recent Labs  06/06/15 0414  ALBUMIN 2.1*   PT/INR No results for input(s): LABPROT, INR in the last 72 hours. Hepatitis Panel No results for input(s): HEPBSAG, HCVAB, HEPAIGM, HEPBIGM in the last 72 hours. C-Diff No results for input(s): CDIFFTOX in the last 72 hours. No results for input(s): CDIFFPCR in the last 72 hours.   Studies/Results: No results found.  Scheduled Inpatient Medications:   . sodium chloride   Intravenous Once  . ammonium lactate  Topical Daily  . antiseptic oral rinse  7 mL Mouth Rinse q12n4p  . chlorhexidine  15 mL Mouth Rinse BID  . collagenase  1 application Topical BID  . diltiazem  60 mg Oral 4 times per day  . feeding supplement (ENSURE ENLIVE)  237 mL Oral TID WC  . gabapentin  300 mg Oral TID  . [START ON 10/12/2015] Leuprolide Acetate (6 Month)  45 mg Intramuscular Q6 months  . metoprolol tartrate  25 mg Oral QID  . mometasone-formoterol  2 puff Inhalation BID  . pantoprazole  40 mg Oral BID  . potassium chloride  40 mEq Oral BID  . predniSONE  10 mg Oral Daily  . tiotropium  18 mcg Inhalation Daily    Continuous Inpatient Infusions:     PRN Inpatient Medications:  acetaminophen **OR** acetaminophen, albuterol, LORazepam, ondansetron **OR** ondansetron (ZOFRAN) IV, sodium chloride, technetium labeled red blood cells, traZODone  Miscellaneous:   Assessment:  1. GI bleeding likely recurrent from duodenal ulcer noted a week earlier. Patient is now been stable from this regard for 2 days. Currently tolerating a full  liquid diet.  Plan:  1. Continue current. Would not advance diet beyond full liquids for another day. Then we can move to low residue/soft diet. Would continue this for 10 days prior to advancing back to a normal, regular diet. 2. Family member requesting wound care in regards to lesions on the back of the left lower leg. 3. If there is increase in pain medication in regards to his complaint of lower back pain would add a daily dose of MiraLAX for regular medication not when necessary. We'll follow with you. We'll also check H. pylori serology and request H. pylori stool antigen when he has a bowel movement.  Lollie Sails MD 06/07/2015, 8:29 PM

## 2015-06-07 NOTE — Progress Notes (Addendum)
[place] at [place] NAME: Chad Kirby    MR#:  [id number]  DATE OF BIRTH:  1929-09-27  SUBJECTIVE:  Looks better, diuresed well (6 liters y'day) but still + 1.6 L, leg swelling down. Daughter at bedside. Some leg pain, K 2.6 REVIEW OF SYSTEMS:    Review of Systems  Constitutional: Negative for fever and chills.  HENT: Negative for congestion and tinnitus.   Eyes: Negative for blurred vision and double vision.  Respiratory: Negative for cough, shortness of breath and wheezing.   Cardiovascular: Negative for chest pain, orthopnea and PND.  Gastrointestinal: Negative for nausea, vomiting, abdominal pain and diarrhea.  marroon dark blood stool. Genitourinary: Negative for dysuria and hematuria.  Neurological: Positive for weakness (generalized). Negative for dizziness, sensory change and focal weakness.  All other systems reviewed and are negative. DRUG ALLERGIES:   Allergies  Allergen Reactions  . Latex Rash    VITALS:  Blood pressure 109/60, pulse 91, temperature 97.5 F (36.4 C), temperature source Oral, resp. rate 20, height 6\' 1"  (1.854 m), weight 104.5 kg (230 lb 6.1 oz), SpO2 95 %. PHYSICAL EXAMINATION:   Physical Exam  GENERAL:  82 y.o.-year-old pale appearing patient lying in the bed with no acute distress.  EYES: Pupils equal, round, reactive to light and accommodation. No scleral icterus. Pale conjunctiva Extraocular muscles intact.  HEENT: Head atraumatic, normocephalic. Oropharynx and nasopharynx clear. Dry oral mucosa NECK:  Supple, no jugular venous distention. No thyroid enlargement, no tenderness.  LUNGS: Normal breath sounds bilaterally, no wheezing, rales, rhonchi. No use of accessory muscles of respiration.  CARDIOVASCULAR: S1, S2 irregular and tachycardia. No murmurs, rubs, or gallops.  ABDOMEN: Soft, nontender, nondistended. Bowel sounds present. No organomegaly or mass.  EXTREMITIES: No cyanosis,  clubbing. Hand swelling, leg swellings down NEUROLOGIC: Cranial nerves II through XII are intact. No focal Motor or sensory deficits b/l. Globally weak  PSYCHIATRIC: The patient is alert and oriented x 3.  SKIN: No obvious rash, lesion, or ulcer.    LABORATORY PANEL:   CBC  Recent Labs Lab 06/07/15 0554  WBC 5.4  HGB 9.4*  HCT 28.4*  PLT 146*   ------------------------------------------------------------------------------------------------------------------  Chemistries   Recent Labs Lab 06/06/15 0414 06/07/15 0554  NA 139 139  K 3.5 2.6*  CL 112* 103  CO2 25 30  GLUCOSE 92 99  BUN 7 10  CREATININE 0.81 0.91  CALCIUM 6.9* 7.6*  MG 1.9  --    ASSESSMENT AND PLAN:  82 year old male with metastatic prostate cancer, recent GI bleed secondary to duodenal ulcer, chronic atrial fibrillation, hyperlipidemia, COPD who presents to the hospital due to rectal bleeding.  #1 GI bleed-suspected due to oozing cratered duodenal ulcer. Bleeding scan on 4/1 was negative -Status post total 6 units of packed red blood cells and hemoglobin is 9.4 this am.. - Protonix 40 mg PO twice a day Continue to hold anticoagulation for a at least few weeks - Advance to soft diet today after as tolerated full liquids  #2 acute blood loss anemia-secondary to the GI bleed. Status post total 6 units of packed red blood cells and hemoglobin is 9.4. -Continue transfusions as needed.  #3 Generalized Anasarca: Likely multifactorial. Protein loss, no/minimal ambulation for last 4-6 weeks and underlying CHF.  - Lasix drip helped - removed more than 6 liters y'day - still 1.6 liters +, will continue lasix drip today - started ensure per dietary  #4 Atrial fibrillation with RVR.  metoprolol as tolerated for HR control (BP is borderline low). Hold aspirin given the GI bleed. Heart rate is controlled, continue cardizem and metoprolol with holding parameters.  #5 hypotension - monitor. stable  #6  history of diastolic CHF, ejection fraction 55-60%.-clinically patient is not in congestive heart failure. LASIX DRIP  *Hypokalemia: Replete and recheck, check mg  * constipation: miralax daily  * neuropathy: on gabapentin 300 mg po BID, will increase to TID as it seems to help  * Hypomagnesemia: monitor  * COPD-no acute exacerbation. Continue Dulera, Spiriva. -Continue maintenance prednisone.   All the records are reviewed and case discussed with Care Management/Social Worker. Management plans discussed with the patient, his daughter and they are in agreement.  CODE STATUS: DO NOT RESUSCITATE  DVT Prophylaxis: Teds and SCDs  TOTAL TIME TAKING CARE OF THIS PATIENT: 38 minutes.   POSSIBLE D/C IN 1-2 DAYS, DEPENDING ON CLINICAL CONDITION. Benefit from palliative care eval at the facility   [facility], Chad Donoghue M.D on 06/07/2015 at 9:03 AM  Between 7am to 6pm - Pager - [phone]  After 6pm go to [email or web address] - password [email or web address] [facility]  [place] [facility]  Office  [phone]  CC: Primary care physician; No primary care provider on file.

## 2015-06-07 NOTE — Progress Notes (Signed)
Notified MD of critical potassium 2.6. Per Dr. Estanislado Pandy, give 40 mEq potassium PO and 10 mEq in 100 mL IVPB q1h x4. Orders placed and acknowledged. Nursing staff will continue to monitor. Earleen Reaper, RN

## 2015-06-07 NOTE — Care Management (Signed)
Barrier to discharge- lasix drip

## 2015-06-07 NOTE — Progress Notes (Signed)
[facility] Cardiology  SUBJECTIVE: I'm feeling better   Filed Vitals:   06/06/15 1950 06/06/15 2356 06/07/15 0541 06/07/15 0541  BP: 93/53 101/49 103/67 103/67  Pulse: 79 79  82  Temp: 97.5 F (36.4 C)   97.4 F (36.3 C)  TempSrc: Oral   Oral  Resp: 18   18  Height:      Weight:      SpO2: 97%   97%     Intake/Output Summary (Last 24 hours) at 06/07/15 0817 Last data filed at 06/07/15 [id number]  Gross per 24 hour  Intake   1440 ml  Output   7850 ml  Net  -6410 ml      PHYSICAL EXAM  General: Well developed, well nourished, in no acute distress HEENT:  Normocephalic and atramatic Neck:  No JVD.  Lungs: Clear bilaterally to auscultation and percussion. Heart: HRRR . Normal S1 and S2 without gallops or murmurs.  Abdomen: Bowel sounds are positive, abdomen soft and non-tender  Msk:  Back normal, normal gait. Normal strength and tone for age. Extremities: No clubbing, cyanosis or edema.   Neuro: Alert and oriented X 3. Psych:  Good affect, responds appropriately   LABS: Basic Metabolic Panel:  Recent Labs  06/06/15 0414 06/07/15 0554  NA 139 139  K 3.5 2.6*  CL 112* 103  CO2 25 30  GLUCOSE 92 99  BUN 7 10  CREATININE 0.81 0.91  CALCIUM 6.9* 7.6*  MG 1.9  --    Liver Function Tests:  Recent Labs  06/06/15 0414  ALBUMIN 2.1*   No results for input(s): LIPASE, AMYLASE in the last 72 hours. CBC:  Recent Labs  06/06/15 0414 06/07/15 0554  WBC 5.6 5.4  HGB 8.2* 9.4*  HCT 24.6* 28.4*  MCV 94.4 94.6  PLT 132* 146*   Cardiac Enzymes: No results for input(s): CKTOTAL, CKMB, CKMBINDEX, TROPONINI in the last 72 hours. BNP: Invalid input(s): POCBNP D-Dimer: No results for input(s): DDIMER in the last 72 hours. Hemoglobin A1C: No results for input(s): HGBA1C in the last 72 hours. Fasting Lipid Panel: No results for input(s): CHOL, HDL, LDLCALC, TRIG, CHOLHDL, LDLDIRECT in the last 72 hours. Thyroid Function Tests: No results for input(s): TSH, T4TOTAL,  T3FREE, THYROIDAB in the last 72 hours.  Invalid input(s): FREET3 Anemia Panel: No results for input(s): VITAMINB12, FOLATE, FERRITIN, TIBC, IRON, RETICCTPCT in the last 72 hours.  No results found.   Echo normal left ventricular function with LV ejection fraction 55-60%  TELEMETRY: Atrial fibrillation:  ASSESSMENT AND PLAN:  Principal Problem:   GIB (gastrointestinal bleeding) Active Problems:   Anemia    1. Atrial fibrillation, rate controlled on diltiazem and metoprolol 2. GI bleed, unremarkable upper endoscopy, appears to be stabilizing 3. Anasarca, poor nutrition, responding to furosemide drip  Recommendations  1. Continue current meds, convert diltiazem to Cardizem CD 240 mg daily, and metoprolol to 50 mg twice a day prior to discharge 2. Defer chronic anticoagulation  Signed off for now, please call if any questions   Marycarmen Hagey, MD, PhD, [facility] 06/07/2015 8:17 AM

## 2015-06-08 ENCOUNTER — Encounter
Admission: RE | Admit: 2015-06-08 | Discharge: 2015-06-08 | Disposition: A | Discharge status: Home / Self Care | Payer: Medicare Other | Source: Ambulatory Visit | Attending: Internal Medicine | Admitting: Internal Medicine

## 2015-06-08 DIAGNOSIS — Z792 Long term (current) use of antibiotics: Secondary | ICD-10-CM | POA: Insufficient documentation

## 2015-06-08 DIAGNOSIS — K567 Ileus, unspecified: Secondary | ICD-10-CM | POA: Insufficient documentation

## 2015-06-08 DIAGNOSIS — I482 Chronic atrial fibrillation: Secondary | ICD-10-CM | POA: Diagnosis not present

## 2015-06-08 DIAGNOSIS — E871 Hypo-osmolality and hyponatremia: Secondary | ICD-10-CM | POA: Insufficient documentation

## 2015-06-08 LAB — BASIC METABOLIC PANEL
Anion gap: 5 (ref 5–15)
BUN: 11 mg/dL (ref 6–20)
CHLORIDE: 97 mmol/L — AB (ref 101–111)
CO2: 34 mmol/L — ABNORMAL HIGH (ref 22–32)
Calcium: 7.9 mg/dL — ABNORMAL LOW (ref 8.9–10.3)
Creatinine, Ser: 1.01 mg/dL (ref 0.61–1.24)
GFR calc non Af Amer: >60 mL/min (ref >60)
Glucose, Bld: 121 mg/dL — ABNORMAL HIGH (ref 65–99)
POTASSIUM: 4.4 mmol/L (ref 3.5–5.1)
SODIUM: 136 mmol/L (ref 135–145)

## 2015-06-08 LAB — CBC
HCT: 27.5 % — ABNORMAL LOW (ref 40.0–52.0)
HEMOGLOBIN: 9.2 g/dL — AB (ref 13.0–18.0)
MCH: 32.1 pg (ref 26.0–34.0)
MCHC: 33.5 g/dL (ref 32.0–36.0)
MCV: 95.9 fL (ref 80.0–100.0)
Platelets: 134 10*3/uL — ABNORMAL LOW (ref 150–440)
RBC: 2.87 MIL/uL — AB (ref 4.40–5.90)
RDW: 22.3 % — ABNORMAL HIGH (ref 11.5–14.5)
WBC: 5.2 10*3/uL (ref 3.8–10.6)

## 2015-06-08 MED ORDER — SENNA 8.6 MG PO TABS
2.0000 | ORAL_TABLET | Freq: Every day | ORAL | Status: DC
  Administered 2015-06-08: 17.2 mg via ORAL
  Filled 2015-06-08: qty 2

## 2015-06-08 MED ORDER — POLYETHYLENE GLYCOL 3350 17 G PO PACK
17.0000 g | PACK | Freq: Every day | ORAL | Status: DC
  Administered 2015-06-08 – 2015-06-10 (×3): 17 g via ORAL
  Filled 2015-06-08 (×3): qty 1

## 2015-06-08 MED ORDER — SODIUM CHLORIDE 0.9 % IV BOLUS (SEPSIS)
250.0000 mL | Freq: Once | INTRAVENOUS | Status: AC
  Administered 2015-06-08: 250 mL via INTRAVENOUS

## 2015-06-08 NOTE — Care Management (Signed)
Anticipate discharge within the next 48 hours if patient is able to tolerate diet.  Insurance Josem Kaufmann is in progress

## 2015-06-08 NOTE — Care Management Important Message (Signed)
Important Message  Patient Details  Name: Chad Kirby MRN: [id number] Date of Birth: [birth year]   Medicare Important Message Given:  Yes    [name] 06/08/2015, 10:49 AM

## 2015-06-08 NOTE — Consult Note (Signed)
Subjective: Patient seen for GI bleeding. Patient is doing well. He denies any nausea vomiting or abdominal pain. He is tolerating some soft diet items. Is no evidence of recurrent bleeding. He did class a small amount of black smear stool earlier. Is is likely old material.  Objective: Vital signs in last 24 hours: Temp:  [98.1 F (36.7 C)-98.4 F (36.9 C)] 98.1 F (36.7 C) (04/05 1115) Pulse Rate:  [83-106] 86 (04/05 1449) Resp:  [18-24] 18 (04/05 1115) BP: (86-110)/(44-72) 98/56 mmHg (04/05 1449) SpO2:  [95 %-96 %] 95 % (04/05 1115) Weight:  [98.113 kg (216 lb 4.8 oz)] 98.113 kg (216 lb 4.8 oz) (04/05 0500) Blood pressure 98/56, pulse 86, temperature 98.1 F (36.7 C), temperature source Oral, resp. rate 18, height 6\' 1"  (1.854 m), weight 98.113 kg (216 lb 4.8 oz), SpO2 95 %.   Intake/Output from previous day: 04/04 0701 - 04/05 0700 In: 2083.2 [P.O.:1520; I.V.:563.2] Out: 4800 [Urine:4800]  Intake/Output this shift: Total I/O In: 234.7 [P.O.:120; I.V.:114.7] Out: 450 [Urine:450]   General appearance:  16 male no distress Resp:  Clear to auscultation Cardio:  Irregular rate and rhythm GI:  Soft protuberant nontender nondistended bowel sounds are positive normoactive Extremities:     Lab Results: Results for orders placed or performed during the hospital encounter of 05/30/15 (from the past 24 hour(s))  CBC     Status: Abnormal   Collection Time: 06/08/15  4:06 AM  Result Value Ref Range   WBC 5.2 3.8 - 10.6 K/uL   RBC 2.87 (L) 4.40 - 5.90 MIL/uL   Hemoglobin 9.2 (L) 13.0 - 18.0 g/dL   HCT 27.5 (L) 40.0 - 52.0 %   MCV 95.9 80.0 - 100.0 fL   MCH 32.1 26.0 - 34.0 pg   MCHC 33.5 32.0 - 36.0 g/dL   RDW 22.3 (H) 11.5 - 14.5 %   Platelets 134 (L) 150 - 440 K/uL  Basic metabolic panel     Status: Abnormal   Collection Time: 06/08/15  4:06 AM  Result Value Ref Range   Sodium 136 135 - 145 mmol/L   Potassium 4.4 3.5 - 5.1 mmol/L   Chloride 97 (L) 101 - 111 mmol/L   CO2  34 (H) 22 - 32 mmol/L   Glucose, Bld 121 (H) 65 - 99 mg/dL   BUN 11 6 - 20 mg/dL   Creatinine, Ser 1.01 0.61 - 1.24 mg/dL   Calcium 7.9 (L) 8.9 - 10.3 mg/dL   GFR calc non Af Amer >60 >60 mL/min   GFR calc Af Amer >60 >60 mL/min   Anion gap 5 5 - 15      Recent Labs  06/06/15 0414 06/07/15 0554 06/08/15 0406  WBC 5.6 5.4 5.2  HGB 8.2* 9.4* 9.2*  HCT 24.6* 28.4* 27.5*  PLT 132* 146* 134*   BMET  Recent Labs  06/06/15 0414 06/07/15 0554 06/08/15 0406  NA 139 139 136  K 3.5 2.6* 4.4  CL 112* 103 97*  CO2 25 30 34*  GLUCOSE 92 99 121*  BUN 7 10 11   CREATININE 0.81 0.91 1.01  CALCIUM 6.9* 7.6* 7.9*   LFT  Recent Labs  06/06/15 0414  ALBUMIN 2.1*   PT/INR No results for input(s): LABPROT, INR in the last 72 hours. Hepatitis Panel No results for input(s): HEPBSAG, HCVAB, HEPAIGM, HEPBIGM in the last 72 hours. C-Diff No results for input(s): CDIFFTOX in the last 72 hours. No results for input(s): CDIFFPCR in the last 72 hours.  Studies/Results: No results found.  Scheduled Inpatient Medications:   . sodium chloride   Intravenous Once  . ammonium lactate   Topical Daily  . antiseptic oral rinse  7 mL Mouth Rinse q12n4p  . chlorhexidine  15 mL Mouth Rinse BID  . collagenase  1 application Topical BID  . diltiazem  60 mg Oral 4 times per day  . feeding supplement (ENSURE ENLIVE)  237 mL Oral TID WC  . gabapentin  300 mg Oral TID  . [START ON 10/12/2015] Leuprolide Acetate (6 Month)  45 mg Intramuscular Q6 months  . metoprolol tartrate  25 mg Oral QID  . mometasone-formoterol  2 puff Inhalation BID  . pantoprazole  40 mg Oral BID  . polyethylene glycol  17 g Oral Daily  . potassium chloride  40 mEq Oral BID  . predniSONE  10 mg Oral Daily  . senna  2 tablet Oral Daily  . tiotropium  18 mcg Inhalation Daily    Continuous Inpatient Infusions:     PRN Inpatient Medications:  acetaminophen **OR** acetaminophen, albuterol, LORazepam, ondansetron **OR**  ondansetron (ZOFRAN) IV, sodium chloride, technetium labeled red blood cells, traZODone  Miscellaneous:   Assessment:  1. GI bleed, recurrent. Duodenal ulcer. Overall patient is doing well currently. He is slowly advancing diet and tolerating this without evidence of repeat bleeding.  Plan:  1. I have slowly since he was in the hospital only 2 weeks ago with the initial GI bleed and he failed management of that GI bleed. He is currently doing well slow reintroduction of food items. Agree that he may be able to leave for rehabilitation tomorrow. I would continue her twice a day PPI. Awaiting results of H. pylori testing. He will need a GI outpatient follow-up in about 10 days or so unless he has other problems in the interim.  Lollie Sails MD 06/08/2015, 3:16 PM

## 2015-06-08 NOTE — Progress Notes (Signed)
Notified Dr. Jannifer Franklin of Bp 88/53 and heart rate of 115. Bolus dose ordered and will hold dose of toprol due now.

## 2015-06-08 NOTE — Progress Notes (Signed)
A&O. Daughter at side. On 2L O2. Lasix drip infusing with good urinary output. BP low at times. Will continue to monitor.

## 2015-06-08 NOTE — Progress Notes (Signed)
[place] at [place] NAME: Chad Kirby    MR#:  [id number]  DATE OF BIRTH:  [birth year]  SUBJECTIVE:  Getting better, daughter at bedside, Neg fluid balance. Off lasix drip REVIEW OF SYSTEMS:    Review of Systems  Constitutional: Negative for fever and chills.  HENT: Negative for congestion and tinnitus.   Eyes: Negative for blurred vision and double vision.  Respiratory: Negative for cough, shortness of breath and wheezing.   Cardiovascular: Negative for chest pain, orthopnea and PND.  Gastrointestinal: Negative for nausea, vomiting, abdominal pain and diarrhea.  marroon dark blood stool. Genitourinary: Negative for dysuria and hematuria.  Neurological: Positive for weakness (generalized). Negative for dizziness, sensory change and focal weakness.  All other systems reviewed and are negative. DRUG ALLERGIES:   Allergies  Allergen Reactions  . Latex Rash    VITALS:  Blood pressure 86/44, pulse 92, temperature 98.1 F (36.7 C), temperature source Oral, resp. rate 18, height 6\' 1"  (1.854 m), weight 98.113 kg (216 lb 4.8 oz), SpO2 95 %. PHYSICAL EXAMINATION:   Physical Exam  GENERAL:  82 y.o.-year-old pale appearing patient lying in the bed with no acute distress.  EYES: Pupils equal, round, reactive to light and accommodation. No scleral icterus. Pale conjunctiva Extraocular muscles intact.  HEENT: Head atraumatic, normocephalic. Oropharynx and nasopharynx clear. Dry oral mucosa NECK:  Supple, no jugular venous distention. No thyroid enlargement, no tenderness.  LUNGS: Normal breath sounds bilaterally, no wheezing, rales, rhonchi. No use of accessory muscles of respiration.  CARDIOVASCULAR: S1, S2 irregular and tachycardia. No murmurs, rubs, or gallops.  ABDOMEN: Soft, nontender, nondistended. Bowel sounds present. No organomegaly or mass.  EXTREMITIES: No cyanosis, clubbing. Hand swelling, leg swellings significantly  down NEUROLOGIC: Cranial nerves II through XII are intact. No focal Motor or sensory deficits b/l. Globally weak  PSYCHIATRIC: The patient is alert and oriented x 3.  SKIN: No obvious rash, lesion, or ulcer.  LABORATORY PANEL:   CBC  Recent Labs Lab 06/08/15 0406  WBC 5.2  HGB 9.2*  HCT 27.5*  PLT 134*   ------------------------------------------------------------------------------------------------------------------  Chemistries   Recent Labs Lab 06/06/15 0414  06/08/15 0406  NA 139  < > 136  K 3.5  < > 4.4  CL 112*  < > 97*  CO2 25  < > 34*  GLUCOSE 92  < > 121*  BUN 7  < > 11  CREATININE 0.81  < > 1.01  CALCIUM 6.9*  < > 7.9*  MG 1.9  --   --   < > = values in this interval not displayed. ASSESSMENT AND PLAN:  82 year old male with metastatic prostate cancer, recent GI bleed secondary to duodenal ulcer, chronic atrial fibrillation, hyperlipidemia, COPD who presents to the hospital due to rectal bleeding.  #1 GI bleed-suspected due to oozing cratered duodenal ulcer. Bleeding scan on 4/1 was negative -Status post total 6 units of packed red blood cells and hemoglobin is 9.4 this am.. - Protonix 40 mg PO twice a day Continue to hold anticoagulation for a at least few weeks - Advance to soft diet today as tolerated full liquids  #2 acute blood loss anemia-secondary to the GI bleed. Status post total 6 units of packed red blood cells and hemoglobin is 9.2. -Continue transfusions as needed.  #3 Generalized Anasarca: Likely multifactorial. Protein loss, no/minimal ambulation for last 4-6 weeks and underlying CHF.  - Lasix drip helped - removed more than 7-9 liters in total -  now neg 400 cc, stop lasix drip - started ensure per dietary  #4 Atrial fibrillation with RVR.   metoprolol as tolerated for HR control (BP is borderline low). Hold aspirin given the GI bleed. Heart rate is controlled, continue cardizem and metoprolol with holding parameters.  #5 hypotension -  monitor. stable  #6 history of diastolic CHF, ejection fraction 55-60%.-clinically patient is not in congestive heart failure. LASIX DRIP  *Hypokalemia: Replete and recheck,   * constipation: miralax daily  * neuropathy: on gabapentin 300 mg po TID as it seems to help  * Hypomagnesemia: monitor  * COPD-no acute exacerbation. Continue Dulera, Spiriva. -Continue maintenance prednisone.  * Constipation: no BM since 4/2, will add senokotm miralax    All the records are reviewed and case discussed with Care Management/Social Worker. Management plans discussed with the patient, his daughter and they are in agreement.  CODE STATUS: DO NOT RESUSCITATE  DVT Prophylaxis: Teds and SCDs  TOTAL TIME TAKING CARE OF THIS PATIENT: 38 minutes.   POSSIBLE D/C IN 1-2 DAYS, DEPENDING ON CLINICAL CONDITION. Benefit from palliative care eval at the facility   [facility], [name] M.D on 06/08/2015 at 12:45 PM  Between 7am to 6pm - Pager - [phone]  After 6pm go to [email or web address] - password [email or web address]  [place] [facility]  Office  [phone]  CC: Primary care physician; No primary care provider on file.

## 2015-06-08 NOTE — Progress Notes (Addendum)
CSW was informed that patient will be ready for discharge today 06/08/15 or tomorrow 06/09/15. CSW submitted information for [employer]. CSW is awaiting auth approval.   CSW was contacted to [facility] with [employer]. Provided auth # [id number]. 3 day auth provided starting April 6. Next auth date will be April 8 and completed by [facility]. CSW informed [facility] admissions of above. CSW will continue to follow and assist.   Ernest Pine, MSW, [place] Work Department [phone]

## 2015-06-08 NOTE — Progress Notes (Signed)
Notified Dr. Reece Levy of BP of 93/58 and on lasix drip. Instructed to still give cardizem dose due this AM.

## 2015-06-09 DIAGNOSIS — I482 Chronic atrial fibrillation: Secondary | ICD-10-CM | POA: Diagnosis not present

## 2015-06-09 LAB — BASIC METABOLIC PANEL
ANION GAP: 5 (ref 5–15)
BUN: 16 mg/dL (ref 6–20)
CALCIUM: 8.3 mg/dL — AB (ref 8.9–10.3)
CO2: 34 mmol/L — AB (ref 22–32)
Chloride: 98 mmol/L — ABNORMAL LOW (ref 101–111)
Creatinine, Ser: 1.01 mg/dL (ref 0.61–1.24)
GFR calc Af Amer: >60 mL/min (ref >60)
GFR calc non Af Amer: >60 mL/min (ref >60)
GLUCOSE: 119 mg/dL — AB (ref 65–99)
Potassium: 4.4 mmol/L (ref 3.5–5.1)
Sodium: 137 mmol/L (ref 135–145)

## 2015-06-09 LAB — CBC
HEMATOCRIT: 24.6 % — AB (ref 40.0–52.0)
HEMATOCRIT: 27.2 % — AB (ref 40.0–52.0)
HEMOGLOBIN: 8.8 g/dL — AB (ref 13.0–18.0)
Hemoglobin: 8.1 g/dL — ABNORMAL LOW (ref 13.0–18.0)
MCH: 31.8 pg (ref 26.0–34.0)
MCH: 31.2 pg (ref 26.0–34.0)
MCHC: 33 g/dL (ref 32.0–36.0)
MCHC: 32.4 g/dL (ref 32.0–36.0)
MCV: 96.3 fL (ref 80.0–100.0)
MCV: 96.4 fL (ref 80.0–100.0)
Platelets: 135 10*3/uL — ABNORMAL LOW (ref 150–440)
Platelets: 145 10*3/uL — ABNORMAL LOW (ref 150–440)
RBC: 2.56 MIL/uL — ABNORMAL LOW (ref 4.40–5.90)
RBC: 2.82 MIL/uL — ABNORMAL LOW (ref 4.40–5.90)
RDW: 22.5 % — AB (ref 11.5–14.5)
RDW: 22.3 % — ABNORMAL HIGH (ref 11.5–14.5)
WBC: 5.1 10*3/uL (ref 3.8–10.6)
WBC: 5.4 10*3/uL (ref 3.8–10.6)

## 2015-06-09 LAB — H PYLORI, IGM, IGG, IGA AB
H. PYLOGI, IGM ABS: 12.2 U — AB (ref 0.0–8.9)
H. Pylogi, Iga Abs: <9 units (ref 0.0–8.9)

## 2015-06-09 MED ORDER — DIGOXIN 0.25 MG/ML IJ SOLN
0.2500 mg | Freq: Once | INTRAMUSCULAR | Status: AC
  Administered 2015-06-09: 0.25 mg via INTRAVENOUS
  Filled 2015-06-09: qty 1

## 2015-06-09 MED ORDER — DIGOXIN 250 MCG PO TABS
0.2500 mg | ORAL_TABLET | Freq: Every day | ORAL | Status: DC
  Administered 2015-06-10: 0.25 mg via ORAL
  Filled 2015-06-09: qty 1

## 2015-06-09 MED ORDER — HYDROCODONE-ACETAMINOPHEN 5-325 MG PO TABS
1.0000 | ORAL_TABLET | ORAL | Status: DC | PRN
  Administered 2015-06-09 (×2): 1 via ORAL
  Filled 2015-06-09 (×2): qty 1

## 2015-06-09 MED ORDER — SENNA 8.6 MG PO TABS
2.0000 | ORAL_TABLET | Freq: Two times a day (BID) | ORAL | Status: DC
  Administered 2015-06-09 – 2015-06-10 (×2): 17.2 mg via ORAL
  Filled 2015-06-09 (×2): qty 2

## 2015-06-09 MED ORDER — DIGOXIN 0.25 MG/ML IJ SOLN
0.2500 mg | Freq: Once | INTRAMUSCULAR | Status: AC
  Administered 2015-06-10: 0.25 mg via INTRAVENOUS
  Filled 2015-06-09: qty 1

## 2015-06-09 MED ORDER — DIGOXIN 0.25 MG/ML IJ SOLN
0.5000 mg | Freq: Once | INTRAMUSCULAR | Status: AC
  Administered 2015-06-09: 0.5 mg via INTRAVENOUS
  Filled 2015-06-09: qty 2

## 2015-06-09 MED ORDER — BISACODYL 10 MG RE SUPP
10.0000 mg | Freq: Every day | RECTAL | Status: DC
  Administered 2015-06-09 – 2015-06-10 (×2): 10 mg via RECTAL
  Filled 2015-06-09 (×3): qty 1

## 2015-06-09 MED ORDER — DILTIAZEM HCL 60 MG PO TABS: 60.0000 mg | ORAL_TABLET | Freq: Four times a day (QID) | ORAL | Status: DC | PRN

## 2015-06-09 MED ORDER — DILTIAZEM HCL ER COATED BEADS 180 MG PO CP24
180.0000 mg | ORAL_CAPSULE | Freq: Every day | ORAL | Status: DC
  Administered 2015-06-09 – 2015-06-10 (×2): 180 mg via ORAL
  Filled 2015-06-09 (×2): qty 1

## 2015-06-09 MED ORDER — CETYLPYRIDINIUM CHLORIDE 0.05 % MT LIQD: 7.0000 mL | OROMUCOSAL | Status: DC | PRN

## 2015-06-09 NOTE — Progress Notes (Signed)
CCMD notified this RN that patients HR increasing to 140s. Upon arrival in room, patient is resting quietly, eating icecream, reports he does not feel his heart beating fast. Dr. Clayborn Bigness paged, reports that PRN PO cardizem will not be sufficient and gave orders for IV dig once now, then q4h x2 and to start PO dig tomorrow AM. Will administer as ordered and page MD if HR not corrected.

## 2015-06-09 NOTE — Care Management (Addendum)
Patient was evaluated by physical therapy 4/3 and the recommendation was for SNF.  Patient will require another treatment session for insurance auth purposes.  Notified physical therapy

## 2015-06-09 NOTE — Progress Notes (Signed)
Physical Therapy Treatment Patient Details Name: Chad Kirby MRN: [id number] DOB: 1929-03-07 Today's Date: 06/09/2015    History of Present Illness Chad Kirby is a 82 y.o. male with a known history of Recent GI bleeding, A. fib, COPD and processes cancer with metastasis. Patient has chronic A. fib and was on Coumadin, which was stopped due to recent GI bleeding. Patient got endoscopy which showed duodenal ulcer and colon polyps. He is taking aspirin for his A. fib. He has had rectal bleeding 2 episode since yesterday. He complains of dizziness, generalized weakness and shortness of breath. But he denies any fever, chills, chest pain or abdominal pain. His hemoglobin decreased to 7.3. Pt is currently admitted due to GIB suspected due to duodenal ulcer, acute blood loss anemia, general anasarca, and Afib with RVR. Pt is s/p blood transfusion. No falls in the last 12 months. Prior to recent illnesses starting February 2017 family and pt report that he was an independent ambulator without assistive device and independent with ADLs    PT Comments    Pt agreeable to PT; family in room. Family reports they were going to get pt up to the chair. Family education/explanation of safety concerns with this idea and advised to allow staff only to transfer pt; family understands. Pt demonstrating improvements in all areas; however, continues to require assist for all mobility as well as increased time and effort. Bilateral lower extremities continue to be quite weak as well as pt's core musculature. Pt participates in seated edge of bed and long sit exercises with education provided to family as well. Pt requires elevation of bed for ability to stand with rolling walker; pt able to take several unsteady steps to chair with encouragement/instruction to quad set with each step. Quick fatigue not allow pt to take last two steps to adjust perfectly with chair for sit requiring increased assist to lower to chair. Pt  O2 saturation decreased on 2 liters from 95% to 92-93% with upright activity. Heart rate ranges from 90 to 137 throughout session fluctuating often. PT recommends skilled nursing facility post acute care stay for continued PT to work on strengthening, balance and endurance to allow improved functional mobility.   Follow Up Recommendations  SNF     Equipment Recommendations  Rolling walker with 5" wheels    Recommendations for Other Services       Precautions / Restrictions Restrictions Weight Bearing Restrictions: No    Mobility  Bed Mobility Overal bed mobility: Needs Assistance Bed Mobility: Supine to Sit     Supine to sit: Min assist;HOB elevated     General bed mobility comments: Increased time/effort; slow to move BLEs over edge of bed and requires heavy UE use and Min A for trunk elevation and scooting to edge of bed  Transfers Overall transfer level: Needs assistance Equipment used: Rolling walker (2 wheeled) Transfers: Sit to/from Stand Sit to Stand: Min assist;From elevated surface         General transfer comment: Slow to rise, cues for quad and glute activation as well as postural muscles for upright position. Weak/unsteady in B knees. Increased assist for stand to sit due to fatigue/slow collapse at B knees.   Ambulation/Gait Ambulation/Gait assistance: Min guard Ambulation Distance (Feet): 3 Feet Assistive device: Rolling walker (2 wheeled) Gait Pattern/deviations: Step-to pattern (Bed to chair only) Gait velocity: slow Gait velocity interpretation: <1.8 ft/sec, indicative of risk for recurrent falls General Gait Details: Several steps bed to chair with weakness/unsteadiness at B knees.  Held in slightly bent position; cues for quad set with each step. Fatigues before finishing last 2 steps needed with increased assist to lower to chair    Stairs            Wheelchair Mobility    Modified Rankin (Stroke Patients Only)       Balance Overall  balance assessment: Needs assistance Sitting-balance support: Feet supported Sitting balance-Leahy Scale: Good Sitting balance - Comments: sat edge of bed extended time with exercises   Standing balance support: Bilateral upper extremity supported Standing balance-Leahy Scale: Poor Standing balance comment: Only toleates standing without assist for under 30 seconds before requiring increased assistance                    Cognition Arousal/Alertness: Awake/alert Behavior During Therapy: WFL for tasks assessed/performed Overall Cognitive Status: Within Functional Limits for tasks assessed                      Exercises General Exercises - Lower Extremity Ankle Circles/Pumps: AROM;Both;10 reps (long sit) Quad Sets: Strengthening;Both;15 reps (long sit) Gluteal Sets: Strengthening;Both;15 reps (long sit) Long Arc Quad: Strengthening;Both;10 reps;Seated (2 sets) Hip ABduction/ADduction: AROM;Both;Seated;20 reps Straight Leg Raises: Both;10 reps;Seated;AAROM;Strengthening (2 sets partial range) Hip Flexion/Marching: Strengthening;Both;10 reps;Seated (2 sets) Toe Raises: AROM;Both;20 reps;Seated Heel Raises: AROM;Both;20 reps;Seated Other Exercises Other Exercises: pillow squeeze 20 seated Other Exercises: trunk extension in sit 10 reps 2 sets    General Comments General comments (skin integrity, edema, etc.): Bandage over open sore distal LLE at lateral calf and Achilles area      Pertinent Vitals/Pain Pain Assessment: 0-10 Pain Score:  (Mod back pain; LLE distal LE more so, no value given) Pain Location: back and L distal LE Pain Intervention(s): Monitored during session;Premedicated before session;Repositioned (Tylenol only)    Home Living                      Prior Function            PT Goals (current goals can now be found in the care plan section) Progress towards PT goals: Progressing toward goals    Frequency  Min 2X/week    PT Plan  Current plan remains appropriate    Co-evaluation             End of Session Equipment Utilized During Treatment: Gait belt;Oxygen Activity Tolerance: Patient limited by fatigue (limited by LE/trunk weakness) Patient left: in chair;with call bell/phone within reach;with chair alarm set;with family/visitor present     Time: [id number] PT Time Calculation (min) (ACUTE ONLY): 42 min  Charges:  $Gait Training: 8-22 mins $Therapeutic Exercise: 8-22 mins $Therapeutic Activity: 8-22 mins                    G Codes:      Charlaine Dalton, PTA 06/09/2015, 10:31 AM

## 2015-06-09 NOTE — Progress Notes (Signed)
[facility] NAME: Chad Kirby    MR#:  [id number]  DATE OF BIRTH:  Jun 16, 1929  SUBJECTIVE:  Getting better, daughter at bedside, Neg fluid balance. Off lasix drip REVIEW OF SYSTEMS:    Review of Systems  Constitutional: Negative for fever and chills.  HENT: Negative for congestion and tinnitus.   Eyes: Negative for blurred vision and double vision.  Respiratory: Negative for cough, shortness of breath and wheezing.   Cardiovascular: Negative for chest pain, orthopnea and PND.  Gastrointestinal: Negative for nausea, vomiting, abdominal pain and diarrhea.  marroon dark blood stool. Genitourinary: Negative for dysuria and hematuria.  Neurological: Positive for weakness (generalized). Negative for dizziness, sensory change and focal weakness.  All other systems reviewed and are negative. DRUG ALLERGIES:   Allergies  Allergen Reactions  . Latex Rash    VITALS:  Blood pressure 91/54, pulse 92, temperature 97.6 F (36.4 C), temperature source Oral, resp. rate 20, height 6\' 1"  (1.854 m), weight 98.113 kg (216 lb 4.8 oz), SpO2 96 %. PHYSICAL EXAMINATION:   Physical Exam  GENERAL:  82 y.o.-year-old pale appearing patient lying in the bed with no acute distress.  EYES: Pupils equal, round, reactive to light and accommodation. No scleral icterus. Pale conjunctiva Extraocular muscles intact.  HEENT: Head atraumatic, normocephalic. Oropharynx and nasopharynx clear. Dry oral mucosa NECK:  Supple, no jugular venous distention. No thyroid enlargement, no tenderness.  LUNGS: Normal breath sounds bilaterally, no wheezing, rales, rhonchi. No use of accessory muscles of respiration.  CARDIOVASCULAR: S1, S2 irregular and tachycardia. No murmurs, rubs, or gallops.  ABDOMEN: Soft, nontender, nondistended. Bowel sounds present. No organomegaly or mass.  EXTREMITIES: No cyanosis, clubbing. Hand swelling, leg swellings significantly  down NEUROLOGIC: Cranial nerves II through XII are intact. No focal Motor or sensory deficits b/l. Globally weak  PSYCHIATRIC: The patient is alert and oriented x 3.  SKIN: No obvious rash, lesion, or ulcer.  LABORATORY PANEL:   CBC  Recent Labs Lab 06/09/15 0406  WBC 5.1  HGB 8.1*  HCT 24.6*  PLT 135*   ------------------------------------------------------------------------------------------------------------------  Chemistries   Recent Labs Lab 06/06/15 0414  06/09/15 0406  NA 139  < > 137  K 3.5  < > 4.4  CL 112*  < > 98*  CO2 25  < > 34*  GLUCOSE 92  < > 119*  BUN 7  < > 16  CREATININE 0.81  < > 1.01  CALCIUM 6.9*  < > 8.3*  MG 1.9  --   --   < > = values in this interval not displayed. ASSESSMENT AND PLAN:  82 year old male with metastatic prostate cancer, recent GI bleed secondary to duodenal ulcer, chronic atrial fibrillation, hyperlipidemia, COPD who presents to the hospital due to rectal bleeding.  #1 GI bleed-suspected due to oozing cratered duodenal ulcer. Bleeding scan on 4/1 was negative -Status post total 6 units of packed red blood cells and hemoglobin is 8.1 (9.2) this am. - unsure etio, will recheck to confirm accuracy and monitor. No further bleeding noted. - Protonix 40 mg PO twice a day Continue to hold anticoagulation for a at least few weeks - tolerating soft diet  #2 acute blood loss anemia-secondary to the GI bleed. Status post total 6 units of packed red blood cells and hemoglobin is 9.2->8.1. -Continue transfusions as needed.  #3 Generalized Anasarca: Likely multifactorial. Protein loss, no/minimal ambulation for last 4-6 weeks and underlying CHF.  - Lasix drip  helped - removed more than 7-9 liters in total - now neg 1.4 liter, off lasix drip - started ensure per dietary  #4 Atrial fibrillation with RVR.  - metoprolol on hold due to hypotension. Hold aspirin given the GI bleed. - Heart rate is controlled, continue cardizem with holding  parameters.  #5 hypotension - hold metoprolol  #6 history of diastolic CHF, ejection fraction 55-60%.-clinically patient is not in congestive heart failure. LASIX DRIP  *Hypokalemia: Replete and recheck,   * constipation: no BM since 4/2. On miralax, senokot and now add dulcolax as he's beign started on norco for back pain  * neuropathy: on gabapentin 300 mg po TID as it seems to help  * Hypomagnesemia: monitor  * COPD-no acute exacerbation. Continue Dulera, Spiriva. -Continue maintenance prednisone.  * chronic back pain: add norco as tylenolol doesn't help   Now, Insurance needs PT reeval for auth   All the records are reviewed and case discussed with Care Management/Social Worker. Management plans discussed with the patient, his daughter and they are in agreement.  CODE STATUS: DO NOT RESUSCITATE  DVT Prophylaxis: Teds and SCDs  TOTAL TIME TAKING CARE OF THIS PATIENT: 38 minutes.   POSSIBLE D/C IN 1-2 DAYS, DEPENDING ON CLINICAL CONDITION. Benefit from palliative care eval at the facility   [facility], Lizza Huffaker M.D on 06/09/2015 at 9:31 AM  Between 7am to 6pm - Pager - [phone]  After 6pm go to [email or web address] - password [email or web address]  [facility]  Office  [phone]  CC: Primary care physician; No primary care provider on file.

## 2015-06-09 NOTE — Plan of Care (Signed)
Problem: Bowel/Gastric: Goal: Will not experience complications related to bowel motility Outcome: Progressing Patient had a bowel movement today.   Problem: Bowel/Gastric: Goal: Will show no signs and symptoms of gastrointestinal bleeding Outcome: Progressing Stool continues to be dark/black, had a bowel movement today. Hemoglobin stable at 8.8.  Problem: Fluid Volume: Goal: Will show no signs and symptoms of excessive bleeding Outcome: Progressing Patients BP is consistently 90s/60s and tolerating well.

## 2015-06-09 NOTE — Progress Notes (Signed)
Notified Dr. Jannifer Franklin of BP 99/50. Instructed to give the toprol and hold cardizem.

## 2015-06-10 MED ORDER — DIGOXIN 250 MCG PO TABS: 0.2500 mg | ORAL_TABLET | Freq: Every day | ORAL | Status: AC

## 2015-06-10 MED ORDER — DILTIAZEM HCL 60 MG PO TABS: 60.0000 mg | ORAL_TABLET | Freq: Four times a day (QID) | ORAL | Status: DC | PRN

## 2015-06-10 MED ORDER — DILTIAZEM HCL ER COATED BEADS 180 MG PO CP24: 180.0000 mg | ORAL_CAPSULE | Freq: Every day | ORAL | Status: DC

## 2015-06-10 MED ORDER — GABAPENTIN 300 MG PO CAPS: 300.0000 mg | ORAL_CAPSULE | Freq: Three times a day (TID) | ORAL | Status: AC

## 2015-06-10 MED ORDER — ENSURE ENLIVE PO LIQD: 237.0000 mL | Freq: Three times a day (TID) | ORAL | Status: AC

## 2015-06-10 NOTE — Progress Notes (Signed)
Patient discharged via EMS

## 2015-06-10 NOTE — Progress Notes (Addendum)
Left posterior leg wound culture obtained, wound cleansed and redressed per WOC order. Wound base red/pink/painful, some yellow slough to lower wound, minimal-scant amount of serosanguineous drainage, no evidence of maceration of surrounding tissue, no purulent drainage.  Family at bedside during dressing change and verbalized satisfaction with dressing change.

## 2015-06-10 NOTE — Care Management Important Message (Signed)
Important Message  Patient Details  Name: Chad Kirby MRN: [id number] Date of Birth: 08/15/1929   Medicare Important Message Given:  Yes    [name] A [name] 06/10/2015, 11:21 AM

## 2015-06-10 NOTE — Progress Notes (Signed)
Patient will be discharging via EMS. Per family request, discharge instructions reviewed with wife & daughter. Family verbalized understanding of medication and wound care regimen. Chad Kirby signed discharge papers. Packet is ready, report will be called once patient is finished with lunch.

## 2015-06-10 NOTE — Discharge Instructions (Signed)
Peptic Ulcer °A peptic ulcer is a painful sore in the lining of your esophagus, stomach, or in the first part of your small intestine. The main causes of an ulcer can be: °· An infection. °· Using certain pain medicines too often or too much. °· Smoking. °HOME CARE °· Avoid smoking, alcohol, and caffeine. °· Avoid foods that bother you. °· Only take medicine as told by your doctor. Do not take any medicines your doctor has not approved. °· Keep all doctor visits as told. °GET HELP IF: °· You do not get better in 7 days after starting treatment. °· You keep having an upset stomach (indigestion) or heartburn. °GET HELP RIGHT AWAY IF: °· You have sudden, sharp, or lasting belly (abdominal) pain. °· You have bloody, black, or tarry poop (stool). °· You throw up (vomit) blood or your throw up looks like coffee grounds. °· You get light-headed, weak, or feel like you will pass out (faint). °· You get sweaty or feel sticky and cold to the touch (clammy). °MAKE SURE YOU:  °· Understand these instructions. °· Will watch your condition. °· Will get help right away if you are not doing well or get worse. °  °This information is not intended to replace advice given to you by your health care provider. Make sure you discuss any questions you have with your health care provider. °  °Document Released: 05/16/2009 Document Revised: 03/12/2014 Document Reviewed: 09/19/2011 °[employer]vier Interactive Patient Education ©2016 [employer] ° °

## 2015-06-10 NOTE — Discharge Summary (Signed)
[facility] [place] NAME: Chad Kirby    MR#:  [id number]  DATE OF BIRTH:  May 13, 1929  DATE OF ADMISSION:  05/30/2015 ADMITTING PHYSICIAN: Demetrios Loll, MD  DATE OF DISCHARGE: .06/10/2015  PRIMARY CARE PHYSICIAN: Frazier Richards MD    ADMISSION DIAGNOSIS:  Acute GI hemorrhage [K92.2] Symptomatic anemia [D64.9]  DISCHARGE DIAGNOSIS:  Principal Problem:   GIB (gastrointestinal bleeding) Active Problems:   Anemia Anasarca - multifactorial  SECONDARY DIAGNOSIS:   Past Medical History  Diagnosis Date  . COPD (chronic obstructive pulmonary disease) ([place])   . Cancer [facility])     Prostate with bone mets-current chemo treatment  . Hyperlipidemia   . Atrial fibrillation [facility])     HOSPITAL COURSE:  82 year old male with metastatic prostate cancer, recent GI bleed secondary to duodenal ulcer, chronic atrial fibrillation, hyperlipidemia, COPD who was admitted to the hospital due to rectal bleeding.  #1 GI bleed-suspected due to oozing cratered duodenal ulcer. Bleeding scan on 4/1 was negative -Status post total 6 units of packed red blood cells and hemoglobin is 8.1 (9.2) this am. - unsure etio, will recheck to confirm accuracy and monitor. No further bleeding noted. - Protonix 40 mg PO twice a day - Continue to hold anticoagulation for a at least few weeks per GI - Tolerating soft diet - Avoid NSAIDs due to duodenal ulcer  #2 acute blood loss anemia-secondary to the GI bleed. Status post total 6 units of packed red blood cells and hemoglobin is 8.8.  #3 Generalized Anasarca: Likely multifactorial. Protein loss, no/minimal ambulation for at least last 4-6 weeks and underlying CHF.  - Lasix drip helped - removed more than 7-9 liters in total while inpt - on ensure per dietary  #4 Atrial fibrillation with RVR.  - not a good candidate for ASA/anticoagulation due to recent GI bleed and duodenal ulcer - on metoprolol, cardizem  and digoxin  #5 hypotension - resolved, consider holding metoprolol and cardizem if SBP < 100  #6 history of diastolic CHF, ejection fraction 55-60%.-clinically patient is not in congestive heart failure. Back on home dose of lasix  #7 Hypokalemia: Repleted  #8 constipation: resolved, had 2 BM on 4/6, avoid narcotics if at all possible  #9 neuropathy: on gabapentin 300 mg po TID as it seems to help  #10 Hypomagnesemia: repleted  #11 COPD-no acute exacerbation. Continue Dulera, Spiriva.  #12 chronic back pain: Avoid narcotics if possible, consider tylenolol and more ambulation  #13 Nonhealing ulcer to left posterior lower leg, present on admission, full thickness Wound type:Consistent with venous insufficiency Pressure Ulcer POA: N/A Measurement:13 cm x 6 cm x 0.2 cm with devitalized tissue in wound bed. 2 full thickness plugs of adherent slough noted in wound bed and new wound distal to this large wound is present and daughter indicates this is new. Measures 2 cm x 2 cm adherent eschar.  WIll begin enzymatic debridement.  Wound bed:50% devitalized tissue Drainage (amount, consistency, odor) Moderate serosanguinous No odor Periwound:Chronic skin changes from venous insufficiency  Patient remains at very high risk for readmissions. This was d/w family and they understand it. DISCHARGE CONDITIONS:   stable  CONSULTS OBTAINED:  Treatment Team:  Lollie Sails, MD Isaias Cowman, MD  DRUG ALLERGIES:   Allergies  Allergen Reactions  . Latex Rash    DISCHARGE MEDICATIONS:   Current Discharge Medication List    START taking these medications   Details  digoxin (LANOXIN) 0.25 MG tablet Take 1  tablet (0.25 mg total) by mouth daily. Qty: 30 tablet, Refills: 0    diltiazem (CARDIZEM CD) 180 MG 24 hr capsule Take 1 capsule (180 mg total) by mouth daily. Qty: 30 capsule, Refills: 0    diltiazem (CARDIZEM) 60 MG tablet Take 1 tablet (60 mg total) by mouth every 6  (six) hours as needed (HR>100). Qty: 120 tablet, Refills: 0    feeding supplement, ENSURE ENLIVE, (ENSURE ENLIVE) LIQD Take 237 mLs by mouth 3 (three) times daily with meals. Qty: 237 mL, Refills: 12      CONTINUE these medications which have CHANGED   Details  gabapentin (NEURONTIN) 300 MG capsule Take 1 capsule (300 mg total) by mouth 3 (three) times daily. Qty: 60 capsule, Refills: 0      CONTINUE these medications which have NOT CHANGED   Details  budesonide-formoterol (SYMBICORT) 80-4.5 MCG/ACT inhaler Inhale 2 puffs into the lungs 2 (two) times daily.    collagenase (SANTYL) ointment Apply 1 application topically 2 (two) times daily. Apply a thick layer to posterior left lower leg, cover with NS damp to dry gauze, wrap with kerlix    furosemide (LASIX) 20 MG tablet Take 20 mg by mouth 2 (two) times daily.     Leuprolide Acetate, 6 Month, (LUPRON) 45 MG injection Inject 45 mg into the muscle every 6 (six) months.    LORazepam (ATIVAN) 0.5 MG tablet Take 0.25 mg by mouth every 4 (four) hours as needed for anxiety.    metoprolol succinate (TOPROL-XL) 25 MG 24 hr tablet Take 25 mg by mouth daily.    pantoprazole (PROTONIX) 40 MG tablet Take 1 tablet (40 mg total) by mouth 2 (two) times daily. Qty: 60 tablet, Refills: 0    polyethylene glycol (MIRALAX / GLYCOLAX) packet Take 17 g by mouth 2 (two) times daily. Qty: 14 each, Refills: 0    potassium chloride SA (K-DUR,KLOR-CON) 20 MEQ tablet Take 40 mEq by mouth daily.    prochlorperazine (COMPAZINE) 10 MG tablet Take 10 mg by mouth every 6 (six) hours as needed. For nausea and vomiting.    senna-docusate (SENOKOT-S) 8.6-50 MG tablet Take 2 tablets by mouth 2 (two) times daily.    sodium chloride (OCEAN) 0.65 % SOLN nasal spray Place 1 spray into both nostrils as needed for congestion.    tiotropium (SPIRIVA) 18 MCG inhalation capsule Place 18 mcg into inhaler and inhale daily.    traZODone (DESYREL) 50 MG tablet Take 50 mg  by mouth at bedtime as needed for sleep.      STOP taking these medications     morphine 20 MG/5ML solution      oxyCODONE (OXY IR/ROXICODONE) 5 MG immediate release tablet      predniSONE (DELTASONE) 10 MG tablet          DISCHARGE INSTRUCTIONS:  Lt leg wound: Dressing procedure/placement/frequency:Cleanse wounds to left posterior lower leg with NS and pat gently dry.Apply Amlactin cream to intact skin to moisturize. Apply santyl ointment to wound beds. Cover with NS moist gauze, 4x4 and kerlix. Change daily.   DIET:  Cardiac soft diet and advance as tolerated over 1 week  DISCHARGE CONDITION:  Stable  ACTIVITY:  Activity as tolerated  OXYGEN:  Home Oxygen: No.   Oxygen Delivery: room air  DISCHARGE LOCATION:  nursing home   If you experience worsening of your admission symptoms, develop shortness of breath, life threatening emergency, suicidal or homicidal thoughts you must seek medical attention immediately by calling 911 or calling your  MD immediately  if symptoms less severe.  You Must read complete instructions/literature along with all the possible adverse reactions/side effects for all the Medicines you take and that have been prescribed to you. Take any new Medicines after you have completely understood and accpet all the possible adverse reactions/side effects.   Please note  You were cared for by a hospitalist during your hospital stay. If you have any questions about your discharge medications or the care you received while you were in the hospital after you are discharged, you can call the unit and asked to speak with the hospitalist on call if the hospitalist that took care of you is not available. Once you are discharged, your primary care physician will handle any further medical issues. Please note that NO REFILLS for any discharge medications will be authorized once you are discharged, as it is imperative that you return to your primary care physician  (or establish a relationship with a primary care physician if you do not have one) for your aftercare needs so that they can reassess your need for medications and monitor your lab values.    On the day of Discharge:  VITAL SIGNS:  Blood pressure 120/54, pulse 91, temperature 97.8 F (36.6 C), temperature source Oral, resp. rate 19, height 6\' 1"  (1.854 m), weight 99.655 kg (219 lb 11.2 oz), SpO2 94 %. PHYSICAL EXAMINATION:  GENERAL:  82 y.o.-year-old patient lying in the bed with no acute distress.  EYES: Pupils equal, round, reactive to light and accommodation. No scleral icterus. Extraocular muscles intact.  HEENT: Head atraumatic, normocephalic. Oropharynx and nasopharynx clear.  NECK:  Supple, no jugular venous distention. No thyroid enlargement, no tenderness.  LUNGS: Normal breath sounds bilaterally, no wheezing, rales,rhonchi or crepitation. No use of accessory muscles of respiration.  CARDIOVASCULAR: S1, S2 normal. No murmurs, rubs, or gallops.  ABDOMEN: Soft, non-tender, non-distended. Bowel sounds present. No organomegaly or mass.  EXTREMITIES: No pedal edema, cyanosis, or clubbing.  NEUROLOGIC: Cranial nerves II through XII are intact. Muscle strength 5/5 in all extremities. Sensation intact. Gait not checked.  PSYCHIATRIC: The patient is alert and oriented x 3.  SKIN: Nonhealing ulcer to left posterior lower leg - dressing in place DATA REVIEW:   CBC  Recent Labs Lab 06/09/15 1030  WBC 5.4  HGB 8.8*  HCT 27.2*  PLT 145*    Chemistries   Recent Labs Lab 06/06/15 0414  06/09/15 0406  NA 139  < > 137  K 3.5  < > 4.4  CL 112*  < > 98*  CO2 25  < > 34*  GLUCOSE 92  < > 119*  BUN 7  < > 16  CREATININE 0.81  < > 1.01  CALCIUM 6.9*  < > 8.3*  MG 1.9  --   --   < > = values in this interval not displayed.   Management plans discussed with the patient, family and they are in agreement. His overall long term prognosis is poor and I highly recommend Palliative care  eval while at the facility. He remains at very high risk for readmissions.  CODE STATUS: DNR  TOTAL TIME TAKING CARE OF THIS PATIENT: 45 minutes.    [facility], Hamilton Marinello M.D on 06/10/2015 at 10:34 AM  Between 7am to 6pm - Pager - [phone]  After 6pm go to [email or web address] - password [email or web address] [facility]  [place] [facility]  Office  [phone]  CC: Primary care physician; Frazier Richards MD  Lollie Sails, MD Isaias Cowman,  MD  Note: This dictation was prepared with Dragon dictation along with smaller phrase technology. Any transcriptional errors that result from this process are unintentional.

## 2015-06-10 NOTE — Progress Notes (Signed)
Report called to Wyline Beady, LPN, nurse at [facility]. Recent labs and vitals reported to her per request. Patient discharging to room 218 on 2L O2. EMS notified of transport. Family at bedside, belongings packed and will be transported with family.

## 2015-06-10 NOTE — Progress Notes (Signed)
A&O. Very weak. H&H stable at 8.8 & 27.2. Blood pressure better this morning. Heart rate has been staying in the low 100s and is now in the 90s, Afib. Wound to left lower extremity cleaned and dressed last HS.

## 2015-06-10 NOTE — Progress Notes (Signed)
Family inquiring about wound culture that Chad Kirby mentioned last week when she evaluated patient. WOC note does not mention need for culture; however, RN asked Chad Kirby who gave verbal order to collect culture of L leg wound. Will cleanse and redress and obtain culture per order. [place] Kirby also paged to come evaluate patient again per family request. No call back yet.

## 2015-06-10 NOTE — Clinical Social Work Note (Signed)
Patient to discharge today to [facility] and CSW has spoken to [place] at [place] and patient's Josem Kaufmann is still good. Patient's family is aware and in agreement. [facility] is going to replace the bed that patient had in his room because the family was not happy with the quality of it due to patient having had numerous incontinence issues on it. Patient to transport via EMS. Shela Leff MSW,LCSW [phone]

## 2015-06-11 DIAGNOSIS — K567 Ileus, unspecified: Secondary | ICD-10-CM | POA: Diagnosis not present

## 2015-06-11 DIAGNOSIS — I482 Chronic atrial fibrillation: Secondary | ICD-10-CM | POA: Diagnosis present

## 2015-06-11 DIAGNOSIS — Z792 Long term (current) use of antibiotics: Secondary | ICD-10-CM | POA: Diagnosis not present

## 2015-06-11 DIAGNOSIS — E871 Hypo-osmolality and hyponatremia: Secondary | ICD-10-CM | POA: Diagnosis not present

## 2015-06-11 LAB — URINALYSIS COMPLETE WITH MICROSCOPIC (ARMC ONLY)
Bilirubin Urine: NEGATIVE
Glucose, UA: NEGATIVE mg/dL
Hgb urine dipstick: NEGATIVE
KETONES UR: NEGATIVE mg/dL
Leukocytes, UA: NEGATIVE
NITRITE: NEGATIVE
PROTEIN: NEGATIVE mg/dL
SPECIFIC GRAVITY, URINE: 1.01 (ref 1.005–1.030)
Squamous Epithelial / LPF: NONE SEEN
pH: 9 — ABNORMAL HIGH (ref 5.0–8.0)

## 2015-06-12 LAB — H. PYLORI ANTIGEN, STOOL: H. PYLORI STOOL AG, EIA: NEGATIVE

## 2015-06-13 DIAGNOSIS — I482 Chronic atrial fibrillation: Secondary | ICD-10-CM | POA: Diagnosis not present

## 2015-06-13 LAB — URINALYSIS COMPLETE WITH MICROSCOPIC (ARMC ONLY)
Bacteria, UA: NONE SEEN
Bilirubin Urine: NEGATIVE
Glucose, UA: NEGATIVE mg/dL
Hgb urine dipstick: NEGATIVE
Ketones, ur: NEGATIVE mg/dL
LEUKOCYTES UA: NEGATIVE
Nitrite: NEGATIVE
PH: 8 (ref 5.0–8.0)
PROTEIN: NEGATIVE mg/dL
SQUAMOUS EPITHELIAL / LPF: NONE SEEN
Specific Gravity, Urine: 1.006 (ref 1.005–1.030)

## 2015-06-13 LAB — URINE CULTURE

## 2015-06-14 LAB — URINE CULTURE

## 2015-06-14 LAB — WOUND CULTURE: SPECIAL REQUESTS: NORMAL

## 2015-06-14 LAB — ANAEROBIC CULTURE: Special Requests: NORMAL

## 2015-06-16 DIAGNOSIS — I482 Chronic atrial fibrillation: Secondary | ICD-10-CM | POA: Diagnosis not present

## 2015-06-16 LAB — CBC WITH DIFFERENTIAL/PLATELET
BASOS ABS: 0 10*3/uL (ref 0–0.1)
BASOS PCT: 1 %
EOS ABS: 0.1 10*3/uL (ref 0–0.7)
Eosinophils Relative: 2 %
HEMATOCRIT: 26.9 % — AB (ref 40.0–52.0)
Hemoglobin: 8.9 g/dL — ABNORMAL LOW (ref 13.0–18.0)
Lymphocytes Relative: 10 %
Lymphs Abs: 0.5 10*3/uL — ABNORMAL LOW (ref 1.0–3.6)
MCH: 31.9 pg (ref 26.0–34.0)
MCHC: 32.9 g/dL (ref 32.0–36.0)
MCV: 97.1 fL (ref 80.0–100.0)
MONO ABS: 0.5 10*3/uL (ref 0.2–1.0)
Monocytes Relative: 11 %
NEUTROS ABS: 3.7 10*3/uL (ref 1.4–6.5)
Neutrophils Relative %: 76 %
PLATELETS: 143 10*3/uL — AB (ref 150–440)
RBC: 2.78 MIL/uL — ABNORMAL LOW (ref 4.40–5.90)
RDW: 21.6 % — AB (ref 11.5–14.5)
WBC: 4.8 10*3/uL (ref 3.8–10.6)

## 2015-06-30 DIAGNOSIS — I482 Chronic atrial fibrillation: Secondary | ICD-10-CM | POA: Diagnosis not present

## 2015-06-30 LAB — COMPREHENSIVE METABOLIC PANEL
ALK PHOS: 177 U/L — AB (ref 38–126)
ALT: 13 U/L — AB (ref 17–63)
ANION GAP: 9 (ref 5–15)
AST: 22 U/L (ref 15–41)
Albumin: 2.8 g/dL — ABNORMAL LOW (ref 3.5–5.0)
BILIRUBIN TOTAL: 0.4 mg/dL (ref 0.3–1.2)
BUN: 13 mg/dL (ref 6–20)
CALCIUM: 8.5 mg/dL — AB (ref 8.9–10.3)
CO2: 30 mmol/L (ref 22–32)
CREATININE: 1.09 mg/dL (ref 0.61–1.24)
Chloride: 101 mmol/L (ref 101–111)
GFR calc non Af Amer: >60 mL/min (ref >60)
GLUCOSE: 155 mg/dL — AB (ref 65–99)
Potassium: 4.1 mmol/L (ref 3.5–5.1)
Sodium: 140 mmol/L (ref 135–145)
TOTAL PROTEIN: 5.5 g/dL — AB (ref 6.5–8.1)

## 2015-06-30 LAB — CBC WITH DIFFERENTIAL/PLATELET
BASOS ABS: 0 10*3/uL (ref 0–0.1)
BASOS PCT: 1 %
EOS ABS: 0.1 10*3/uL (ref 0–0.7)
EOS PCT: 1 %
HCT: 29.6 % — ABNORMAL LOW (ref 40.0–52.0)
Hemoglobin: 9.6 g/dL — ABNORMAL LOW (ref 13.0–18.0)
Lymphocytes Relative: 7 %
Lymphs Abs: 0.4 10*3/uL — ABNORMAL LOW (ref 1.0–3.6)
MCH: 31.9 pg (ref 26.0–34.0)
MCHC: 32.4 g/dL (ref 32.0–36.0)
MCV: 98.3 fL (ref 80.0–100.0)
MONO ABS: 0.6 10*3/uL (ref 0.2–1.0)
Monocytes Relative: 10 %
Neutro Abs: 5 10*3/uL (ref 1.4–6.5)
Neutrophils Relative %: 81 %
Platelets: 171 10*3/uL (ref 150–440)
RBC: 3.01 MIL/uL — ABNORMAL LOW (ref 4.40–5.90)
RDW: 20.9 % — ABNORMAL HIGH (ref 11.5–14.5)
WBC: 6.1 10*3/uL (ref 3.8–10.6)

## 2015-07-04 ENCOUNTER — Encounter
Admission: RE | Admit: 2015-07-04 | Discharge: 2015-07-04 | Disposition: A | Discharge status: Home / Self Care | Payer: Medicare Other | Source: Ambulatory Visit | Attending: Internal Medicine | Admitting: Internal Medicine

## 2015-07-04 DIAGNOSIS — R410 Disorientation, unspecified: Secondary | ICD-10-CM | POA: Insufficient documentation

## 2015-07-04 LAB — CBC WITH DIFFERENTIAL/PLATELET
BASOS ABS: 0.1 10*3/uL (ref 0–0.1)
Basophils Relative: 1 %
Eosinophils Absolute: 0.1 10*3/uL (ref 0–0.7)
Eosinophils Relative: 1 %
HCT: 28 % — ABNORMAL LOW (ref 40.0–52.0)
HEMOGLOBIN: 9.2 g/dL — AB (ref 13.0–18.0)
LYMPHS ABS: 0.4 10*3/uL — AB (ref 1.0–3.6)
Lymphocytes Relative: 6 %
MCH: 31.8 pg (ref 26.0–34.0)
MCHC: 32.8 g/dL (ref 32.0–36.0)
MCV: 96.9 fL (ref 80.0–100.0)
MONO ABS: 1 10*3/uL (ref 0.2–1.0)
MONOS PCT: 13 %
NEUTROS PCT: 79 %
Neutro Abs: 5.8 10*3/uL (ref 1.4–6.5)
PLATELETS: 157 10*3/uL (ref 150–440)
RBC: 2.89 MIL/uL — AB (ref 4.40–5.90)
RDW: 20.5 % — ABNORMAL HIGH (ref 11.5–14.5)
WBC: 7.4 10*3/uL (ref 3.8–10.6)

## 2015-07-04 LAB — URINALYSIS COMPLETE WITH MICROSCOPIC (ARMC ONLY)
Bilirubin Urine: NEGATIVE
Glucose, UA: NEGATIVE mg/dL
Hgb urine dipstick: NEGATIVE
KETONES UR: NEGATIVE mg/dL
NITRITE: NEGATIVE
PH: 6 (ref 5.0–8.0)
PROTEIN: 30 mg/dL — AB
SPECIFIC GRAVITY, URINE: 1.016 (ref 1.005–1.030)

## 2015-07-04 LAB — COMPREHENSIVE METABOLIC PANEL
ALK PHOS: 160 U/L — AB (ref 38–126)
ALT: 10 U/L — AB (ref 17–63)
AST: 17 U/L (ref 15–41)
Albumin: 2.5 g/dL — ABNORMAL LOW (ref 3.5–5.0)
Anion gap: 8 (ref 5–15)
BUN: 15 mg/dL (ref 6–20)
CHLORIDE: 99 mmol/L — AB (ref 101–111)
CO2: 29 mmol/L (ref 22–32)
CREATININE: 0.95 mg/dL (ref 0.61–1.24)
Calcium: 7.9 mg/dL — ABNORMAL LOW (ref 8.9–10.3)
GFR calc Af Amer: >60 mL/min (ref >60)
GFR calc non Af Amer: >60 mL/min (ref >60)
GLUCOSE: 133 mg/dL — AB (ref 65–99)
Potassium: 4 mmol/L (ref 3.5–5.1)
SODIUM: 136 mmol/L (ref 135–145)
Total Bilirubin: 1 mg/dL (ref 0.3–1.2)
Total Protein: 5.5 g/dL — ABNORMAL LOW (ref 6.5–8.1)

## 2015-07-06 LAB — URINE CULTURE

## 2015-07-16 DIAGNOSIS — R410 Disorientation, unspecified: Secondary | ICD-10-CM | POA: Diagnosis not present

## 2015-07-16 LAB — URINALYSIS COMPLETE WITH MICROSCOPIC (ARMC ONLY)
Bacteria, UA: NONE SEEN
Bilirubin Urine: NEGATIVE
Glucose, UA: NEGATIVE mg/dL
Hgb urine dipstick: NEGATIVE
Ketones, ur: NEGATIVE mg/dL
LEUKOCYTES UA: NEGATIVE
NITRITE: NEGATIVE
PROTEIN: NEGATIVE mg/dL
Specific Gravity, Urine: 1.011 (ref 1.005–1.030)
pH: 7 (ref 5.0–8.0)

## 2015-07-18 LAB — URINE CULTURE: Culture: 7000 — AB

## 2015-08-04 ENCOUNTER — Inpatient Hospital Stay: Admission: RE | Admit: 2015-08-04 | Payer: Self-pay | Source: Ambulatory Visit

## 2015-08-04 ENCOUNTER — Emergency Department
Admission: EM | Admit: 2015-08-04 | Discharge: 2015-08-04 | Disposition: A | Discharge status: Home / Self Care | Payer: Medicare Other | Attending: Emergency Medicine | Admitting: Emergency Medicine

## 2015-08-04 ENCOUNTER — Emergency Department: Payer: Medicare Other

## 2015-08-04 ENCOUNTER — Encounter
Admission: RE | Admit: 2015-08-04 | Discharge: 2015-08-04 | Disposition: A | Discharge status: Home / Self Care | Payer: Medicare Other | Source: Ambulatory Visit | Attending: Internal Medicine | Admitting: Internal Medicine

## 2015-08-04 DIAGNOSIS — S61512A Laceration without foreign body of left wrist, initial encounter: Secondary | ICD-10-CM | POA: Insufficient documentation

## 2015-08-04 DIAGNOSIS — E860 Dehydration: Secondary | ICD-10-CM

## 2015-08-04 DIAGNOSIS — Z91040 Latex allergy status: Secondary | ICD-10-CM | POA: Diagnosis not present

## 2015-08-04 DIAGNOSIS — R55 Syncope and collapse: Secondary | ICD-10-CM | POA: Diagnosis present

## 2015-08-04 DIAGNOSIS — I4891 Unspecified atrial fibrillation: Secondary | ICD-10-CM | POA: Diagnosis not present

## 2015-08-04 DIAGNOSIS — Z79899 Other long term (current) drug therapy: Secondary | ICD-10-CM | POA: Diagnosis not present

## 2015-08-04 DIAGNOSIS — Y929 Unspecified place or not applicable: Secondary | ICD-10-CM | POA: Insufficient documentation

## 2015-08-04 DIAGNOSIS — Z8546 Personal history of malignant neoplasm of prostate: Secondary | ICD-10-CM | POA: Insufficient documentation

## 2015-08-04 DIAGNOSIS — W1800XA Striking against unspecified object with subsequent fall, initial encounter: Secondary | ICD-10-CM | POA: Insufficient documentation

## 2015-08-04 DIAGNOSIS — I951 Orthostatic hypotension: Secondary | ICD-10-CM | POA: Insufficient documentation

## 2015-08-04 DIAGNOSIS — E785 Hyperlipidemia, unspecified: Secondary | ICD-10-CM | POA: Diagnosis not present

## 2015-08-04 DIAGNOSIS — S51011A Laceration without foreign body of right elbow, initial encounter: Secondary | ICD-10-CM | POA: Insufficient documentation

## 2015-08-04 DIAGNOSIS — Y999 Unspecified external cause status: Secondary | ICD-10-CM | POA: Diagnosis not present

## 2015-08-04 DIAGNOSIS — S81812A Laceration without foreign body, left lower leg, initial encounter: Secondary | ICD-10-CM | POA: Insufficient documentation

## 2015-08-04 DIAGNOSIS — J449 Chronic obstructive pulmonary disease, unspecified: Secondary | ICD-10-CM | POA: Insufficient documentation

## 2015-08-04 DIAGNOSIS — Y9389 Activity, other specified: Secondary | ICD-10-CM | POA: Insufficient documentation

## 2015-08-04 DIAGNOSIS — T148XXA Other injury of unspecified body region, initial encounter: Secondary | ICD-10-CM

## 2015-08-04 DIAGNOSIS — S81811A Laceration without foreign body, right lower leg, initial encounter: Secondary | ICD-10-CM | POA: Insufficient documentation

## 2015-08-04 DIAGNOSIS — S51012A Laceration without foreign body of left elbow, initial encounter: Secondary | ICD-10-CM | POA: Insufficient documentation

## 2015-08-04 DIAGNOSIS — S0083XA Contusion of other part of head, initial encounter: Secondary | ICD-10-CM | POA: Diagnosis not present

## 2015-08-04 DIAGNOSIS — Z87891 Personal history of nicotine dependence: Secondary | ICD-10-CM | POA: Insufficient documentation

## 2015-08-04 DIAGNOSIS — S61511A Laceration without foreign body of right wrist, initial encounter: Secondary | ICD-10-CM | POA: Diagnosis not present

## 2015-08-04 LAB — CBC
HEMATOCRIT: 35.7 % — AB (ref 40.0–52.0)
HEMOGLOBIN: 11.7 g/dL — AB (ref 13.0–18.0)
MCH: 30.6 pg (ref 26.0–34.0)
MCHC: 32.7 g/dL (ref 32.0–36.0)
MCV: 93.7 fL (ref 80.0–100.0)
Platelets: 176 10*3/uL (ref 150–440)
RBC: 3.81 MIL/uL — ABNORMAL LOW (ref 4.40–5.90)
RDW: 18.3 % — AB (ref 11.5–14.5)
WBC: 9.2 10*3/uL (ref 3.8–10.6)

## 2015-08-04 LAB — BASIC METABOLIC PANEL
ANION GAP: 8 (ref 5–15)
BUN: 17 mg/dL (ref 6–20)
CO2: 27 mmol/L (ref 22–32)
Calcium: 8.2 mg/dL — ABNORMAL LOW (ref 8.9–10.3)
Chloride: 102 mmol/L (ref 101–111)
Creatinine, Ser: 1.17 mg/dL (ref 0.61–1.24)
GFR, EST NON AFRICAN AMERICAN: 56 mL/min — AB (ref >60)
Glucose, Bld: 127 mg/dL — ABNORMAL HIGH (ref 65–99)
POTASSIUM: 4.1 mmol/L (ref 3.5–5.1)
Sodium: 137 mmol/L (ref 135–145)

## 2015-08-04 LAB — PROTIME-INR
INR: 1.14
Prothrombin Time: 14.8 seconds (ref 11.4–15.0)

## 2015-08-04 LAB — GLUCOSE, CAPILLARY: GLUCOSE-CAPILLARY: 129 mg/dL — AB (ref 65–99)

## 2015-08-04 MED ORDER — SODIUM CHLORIDE 0.9 % IV BOLUS (SEPSIS)
1000.0000 mL | Freq: Once | INTRAVENOUS | Status: AC
  Administered 2015-08-04: 1000 mL via INTRAVENOUS

## 2015-08-04 NOTE — ED Notes (Signed)
Pt from [facility] place via EMS, reports he was having a bowel movement when he became dizzy and lost consciousness. Pt presents with multiple skin tears and laceration to forehead. Pt on 2L oxygen chronically

## 2015-08-04 NOTE — ED Provider Notes (Addendum)
[facility] Emergency Department Provider Note  ____________________________________________  Time seen: Approximately 4:25 PM  I have reviewed the triage vital signs and the nursing notes.   HISTORY  Chief Complaint Fall    HPI Chad Kirby is a 82 y.o. male with a history of A. fib on Coumadin, COPD on O2, and prostate cancer completed chemotherapy 6 weeks ago presenting for fall. The patient reports he had a bowel movement and when he stood up he developed severe lightheadedness, tried to sit back down but fell striking his left forehead. He did not have a prolonged period of loss of consciousness, and new food he was aware he was immediately. He denies any neck or back pain, extremity or hip pain. No preceding chest pain, palpitations, shortness of breath, or recent illness. He and his wife both report that he has had significant decrease in his by mouth intake due to lack of appetite. At this time, the patient has a mild headache which is improving after Tylenol which he took at the nursing home. He also has multiple skin tears on the bilateral arms and legs. He denies any visual changes, speech changes, mental status changes, numbness tingling or weakness.  Last tetanus booster less than 5 years.   Past Medical History  Diagnosis Date  . COPD (chronic obstructive pulmonary disease) ([place])   . Cancer [facility])     Prostate with bone mets-current chemo treatment  . Hyperlipidemia   . Atrial fibrillation [facility])     Patient Active Problem List   Diagnosis Date Noted  . GIB (gastrointestinal bleeding) 05/30/2015  . Anemia 05/30/2015  . Lower GI bleed 05/15/2015  . Cellulitis of left lower extremity   . Atrial fibrillation with RVR ([place]) 05/01/2015  . Sepsis ([place]) 05/01/2015  . Cellulitis and abscess of leg 05/01/2015    Past Surgical History  Procedure Laterality Date  . Replacement total knee Right   . Colonoscopy with propofol N/A 05/18/2015     Procedure: COLONOSCOPY WITH PROPOFOL;  Surgeon: Hulen Luster, MD;  Location: [facility] ENDOSCOPY;  Service: Endoscopy;  Laterality: N/A;  . Esophagogastroduodenoscopy N/A 05/19/2015    Procedure: ESOPHAGOGASTRODUODENOSCOPY (EGD);  Surgeon: Hulen Luster, MD;  Location: [facility] ENDOSCOPY;  Service: Endoscopy;  Laterality: N/A;  . Esophagogastroduodenoscopy (egd) with propofol N/A 06/02/2015    Procedure: ESOPHAGOGASTRODUODENOSCOPY (EGD) WITH PROPOFOL;  Surgeon: Lollie Sails, MD;  Location: [facility] ENDOSCOPY;  Service: Endoscopy;  Laterality: N/A;  patient may require intubation    Current Outpatient Rx  Name  Route  Sig  Dispense  Refill  . budesonide-formoterol (SYMBICORT) 80-4.5 MCG/ACT inhaler   Inhalation   Inhale 2 puffs into the lungs 2 (two) times daily.         . collagenase (SANTYL) ointment   Topical   Apply 1 application topically 2 (two) times daily. Apply a thick layer to posterior left lower leg, cover with NS damp to dry gauze, wrap with kerlix         . digoxin (LANOXIN) 0.25 MG tablet   Oral   Take 1 tablet (0.25 mg total) by mouth daily.   30 tablet   0   . diltiazem (CARDIZEM CD) 180 MG 24 hr capsule   Oral   Take 1 capsule (180 mg total) by mouth daily.   30 capsule   0   . diltiazem (CARDIZEM) 60 MG tablet   Oral   Take 1 tablet (60 mg total) by mouth every 6 (six) hours  as needed (HR>100).   120 tablet   0   . feeding supplement, ENSURE ENLIVE, (ENSURE ENLIVE) LIQD   Oral   Take 237 mLs by mouth 3 (three) times daily with meals.   237 mL   12   . furosemide (LASIX) 20 MG tablet   Oral   Take 20 mg by mouth 2 (two) times daily.          Marland Kitchen gabapentin (NEURONTIN) 300 MG capsule   Oral   Take 1 capsule (300 mg total) by mouth 3 (three) times daily.   60 capsule   0   . Leuprolide Acetate, 6 Month, (LUPRON) 45 MG injection   Intramuscular   Inject 45 mg into the muscle every 6 (six) months.         Marland Kitchen LORazepam (ATIVAN) 0.5 MG tablet   Oral   Take 0.25  mg by mouth every 4 (four) hours as needed for anxiety.         . metoprolol succinate (TOPROL-XL) 25 MG 24 hr tablet   Oral   Take 25 mg by mouth daily.         . pantoprazole (PROTONIX) 40 MG tablet   Oral   Take 1 tablet (40 mg total) by mouth 2 (two) times daily.   60 tablet   0   . polyethylene glycol (MIRALAX / GLYCOLAX) packet   Oral   Take 17 g by mouth 2 (two) times daily.   14 each   0   . potassium chloride SA (K-DUR,KLOR-CON) 20 MEQ tablet   Oral   Take 40 mEq by mouth daily.         . prochlorperazine (COMPAZINE) 10 MG tablet   Oral   Take 10 mg by mouth every 6 (six) hours as needed. For nausea and vomiting.         . senna-docusate (SENOKOT-S) 8.6-50 MG tablet   Oral   Take 2 tablets by mouth 2 (two) times daily.         . sodium chloride (OCEAN) 0.65 % SOLN nasal spray   Each Nare   Place 1 spray into both nostrils as needed for congestion.         Marland Kitchen tiotropium (SPIRIVA) 18 MCG inhalation capsule   Inhalation   Place 18 mcg into inhaler and inhale daily.         . traZODone (DESYREL) 50 MG tablet   Oral   Take 50 mg by mouth at bedtime as needed for sleep.           Allergies Latex  Family History  Problem Relation Age of Onset  . Brain cancer Sister   . Stroke Father     Social History Social History  Substance Use Topics  . Smoking status: Former Research scientist (life sciences)  . Smokeless tobacco: Not on file  . Alcohol Use: 0.0 oz/week    0 Standard drinks or equivalent per week     Comment: occasional    Review of Systems Constitutional: No fever/chills.Positive lightheadedness. Positive syncope. Eyes: No visual changes. No blurred or double vision. ENT: No sore throat. No congestion or rhinorrhea. Cardiovascular: Denies chest pain. Denies palpitations. Respiratory: Denies shortness of breath.  No cough. Gastrointestinal: No abdominal pain.  No nausea, no vomiting.  No diarrhea.  No constipation. Genitourinary: Negative for  dysuria. Musculoskeletal: Negative for back pain. Negative for neck pain. Skin: Negative for rash. Neurological: Positive for mild headache. No focal numbness, tingling or weakness. Baseline nonambulatory  10-point ROS otherwise negative.  ____________________________________________   PHYSICAL EXAM:  VITAL SIGNS: ED Triage Vitals  Enc Vitals Group     BP 08/04/15 1618 98/54 mmHg     Pulse Rate 08/04/15 1615 70     Resp 08/04/15 1615 16     Temp 08/04/15 1615 98.1 F (36.7 C)     Temp src --      SpO2 08/04/15 1615 96 %     Weight --      Height --      Head Cir --      Peak Flow --      Pain Score --      Pain Loc --      Pain Edu? --      Excl. in [place]? --     Constitutional: Alert and oriented. Chronically ill appearing but nontoxic. Answers questions appropriately. GCS 15. Eyes: Conjunctivae are normal.  EOMI. No scleral icterus. No raccoon eyes. Head: 6 x 6 cm area of swelling and ecchymosis with a central abrasion over the left forehead. No Battle sign. Nose: No congestion/rhinnorhea. No swelling. No septal hematoma. Mouth/Throat: Mucous membranes are moist. No dental injury or malocclusion. Neck: No stridor.  Supple.  Full range of motion without pain. No midline C-spine tenderness to palpation, step-offs or deformities. Cardiovascular: Irregular rate, regular rhythm. No murmurs, rubs or gallops.  Respiratory: Normal respiratory effort.  No accessory muscle use or retractions. Lungs CTAB.  No wheezes, rales or ronchi. Gastrointestinal: Soft, nontender and nondistended.  No guarding or rebound.  No peritoneal signs. Pelvis: Pelvis is stable. Musculoskeletal: No LE edema. No ttp in the calves or palpable cords.  Negative Homan's sign. Full range of motion of the bilateral wrists, elbows, shoulders, hips, knees, and ankles without pain. Normal DP and PT pulses as well as radial pulses bilaterally. Neurologic:  A&Ox3.  Speech is clear.  Face and smile are symmetric.  EOMI.   Moves all extremities well. Skin:  Skin is warm. Patient has superficial skin tears on the bilateral elbows, bilateral wrists, right tibia, left posterior lower leg. Psychiatric: Mood and affect are normal. Speech and behavior are normal.  Normal judgement  ____________________________________________   LABS (all labs ordered are listed, but only abnormal results are displayed)  Labs Reviewed  CBC - Abnormal; Notable for the following:    RBC 3.81 (*)    Hemoglobin 11.7 (*)    HCT 35.7 (*)    RDW 18.3 (*)    All other components within normal limits  BASIC METABOLIC PANEL - Abnormal; Notable for the following:    Glucose, Bld 127 (*)    Calcium 8.2 (*)    GFR calc non Af Amer 56 (*)    All other components within normal limits  GLUCOSE, CAPILLARY - Abnormal; Notable for the following:    Glucose-Capillary 129 (*)    All other components within normal limits  PROTIME-INR  URINALYSIS COMPLETEWITH MICROSCOPIC ([facility] ONLY)  CBG MONITORING, ED   ____________________________________________  EKG  ED ECG REPORT I, Eula Listen, the attending physician, personally viewed and interpreted this ECG.   Date: 08/04/2015  EKG Time: 1628  Rate: 62  Rhythm: atrial fibrillation  Axis: Normal  Intervals:none  ST&T Change: No ST changes  ____________________________________________  RADIOLOGY  Ct Head Wo Contrast  08/04/2015  CLINICAL DATA:  Dizziness, loss of consciousness. Head laceration after fall. EXAM: CT HEAD WITHOUT CONTRAST TECHNIQUE: Contiguous axial images were obtained from the base of the skull through  the vertex without intravenous contrast. COMPARISON:  None. FINDINGS: Bony calvarium appears intact. Small left frontal scalp hematoma is noted. Mild diffuse cortical atrophy is noted. Mild chronic ischemic white matter disease is noted. No mass effect or midline shift is noted. Ventricular size is within normal limits. There is no evidence of mass lesion, hemorrhage  or acute infarction. IMPRESSION: Mild diffuse cortical atrophy. Mild chronic ischemic white matter disease. Small left frontal scalp hematoma. No acute intracranial abnormality seen. Electronically Signed   By: Marijo Conception, M.D.   On: 08/04/2015 17:24    ____________________________________________   PROCEDURES  Procedure(s) performed: None  Critical Care performed: No ____________________________________________   INITIAL IMPRESSION / ASSESSMENT AND PLAN / ED COURSE  Pertinent labs & imaging results that were available during my care of the patient were reviewed by me and considered in my medical decision making (see chart for details).  82 y.o. male on Coumadin status post syncopal episode with fall. On arrival to the emergency department, the patient's blood pressure is 98/54. He does appear mildly dry, and this may be due to hypovolemia from not eating and drinking properly over last several days. He has atrial fibrillation on his EKG with a normal rate and I do not see any evidence of ischemia. We will also check his electrolytes, including his blood sugar for other possible causes of his syncope. It is possible that he may have also had a vasovagal event since this occurred in the setting of having a bowel movement. From a traumatic perspective, I'll get a CT scan of the head to rule out intracranial bleed, but do not see any evidence of extremity injuries as by his multiple skin tears at this time.  ----------------------------------------- 4:57 PM on 08/04/2015 ----------------------------------------- The patient's blood is suggestive of hemoconcentration with a hematocrit greater than his usual, as well as a creatinine which is normal but slightly greater than usual. I'm awaiting for his imaging to result. At this time, his wounds are being extensively washed with normal saline, and skin tears that can be repaired with Steri-Strips are being  applied.  ----------------------------------------- 5:44 PM on 08/04/2015 -----------------------------------------  The patient's CT scan does not show any intracranial process. He has remained hemodynamically stable, his blood pressure has improved with IVF, and he has no complaints at this time. His INR is slightly low, and I have asked him to please talk to his primary care physician about adjusting his Coumadin. We'll plan to discharge the patient at this time, and the patient and his wife understand return precautions as well as follow-up instructions.   ____________________________________________  FINAL CLINICAL IMPRESSION(S) / ED DIAGNOSES  Final diagnoses:  Syncope, unspecified syncope type  Dehydration  Forehead contusion, initial encounter  Multiple skin tears      NEW MEDICATIONS STARTED DURING THIS VISIT:  New Prescriptions   No medications on file     Eula Listen, MD 08/04/15 1744  Eula Listen, MD 08/04/15 [id number]

## 2015-08-04 NOTE — ED Notes (Signed)
Pt awaiting transport back to [facility] via EMS

## 2015-08-04 NOTE — ED Notes (Signed)
Pt's lacerations cleaned and dressings applied, steri strips applied to laceration on R knee, and L arm.

## 2015-08-04 NOTE — Discharge Instructions (Signed)
Please make sure you drink plenty of fluids and eat small regular meals throughout the day to prevent low blood pressure or fainting.  Today her INR is only 1.14; please talk to your primary care physician about your Coumadin and whether you should change your dose.  For your skin contusions, continue to dress them twice daily with Vaseline gauze and overlying normal gauze to prevent infection. You may also apply Neosporin or any triple antibiotic cream and a thick coat twice daily.  Return to the emergency department if you develop severe pain, fever, changes in mental status, vomiting, or any other symptoms concerning to you.

## 2016-07-11 IMAGING — NM NM GI BLOOD LOSS
2 series · 12 of 12 positions shown · non-contrast
Comparison: None

CLINICAL DATA: [place] stools for couple days, fall hemoglobin,
transfuse 6 units of blood on 06/02/2015, history prostate cancer
and recent colonic polypectomy, atrial fibrillation, COPD

EXAM:
NUCLEAR MEDICINE GASTROINTESTINAL BLEEDING SCAN
TECHNIQUE: Sequential abdominal images were obtained for 2 hours following
intravenous administration of Fc-KKm labeled red blood cells.
RADIOPHARMACEUTICALS:  19.33 mCi Fc-KKm in-vitro labeled autologous
red cells.

[Series 1000: gi bleed · 4.80mm/px · 6 of 60 frames shown]
[frame 6/60]
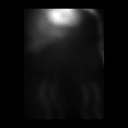
[frame 16/60]
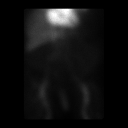
[frame 26/60]
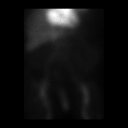
[frame 36/60]
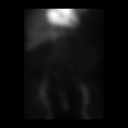
[frame 46/60]
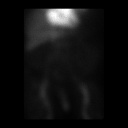
[frame 56/60]
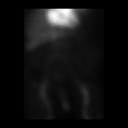

[Series 1000: gi bleed 2nd hour · 4.80mm/px · 6 of 60 frames shown]
[frame 6/60]
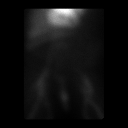
[frame 16/60]
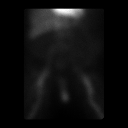
[frame 26/60]
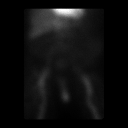
[frame 36/60]
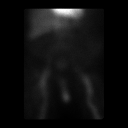
[frame 46/60]
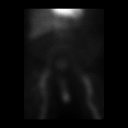
[frame 56/60]
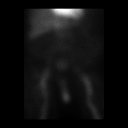

[12 of 12 positions shown; findings below may reference images not displayed]

FINDINGS: Normal blood pool distribution of tracer.

Expected excretion of de labeled tracer into urinary bladder.

No abnormal gastrointestinal localization of tracer identified to
indicate active GI bleeding.
IMPRESSION: Negative GI bleeding scan through 2 hours following injection.

## 2016-09-10 IMAGING — CT CT HEAD W/O CM
3 series · 16 of 47 positions shown, 19 images · non-contrast
Comparison: None.

CLINICAL DATA: Dizziness, loss of consciousness. Head laceration
after fall.

EXAM:
CT HEAD WITHOUT CONTRAST
TECHNIQUE: Contiguous axial images were obtained from the base of the skull
through the vertex without intravenous contrast.

[Series 2: head wo · axial · 0.47mm/px · z∈[-34,+102]mm · 10 of 33 slices shown, 13 images]
[im 3/33  brain]
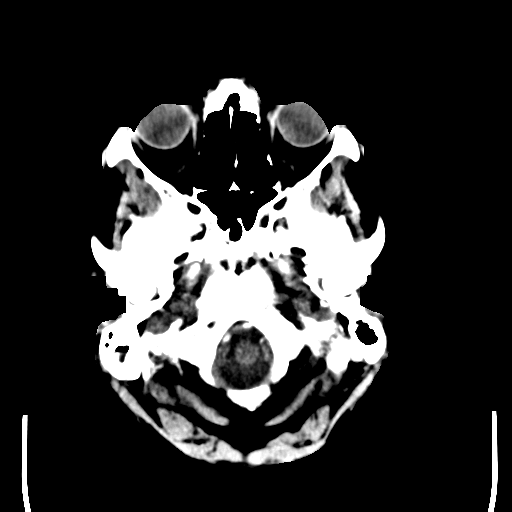
[im 3/33  bone]
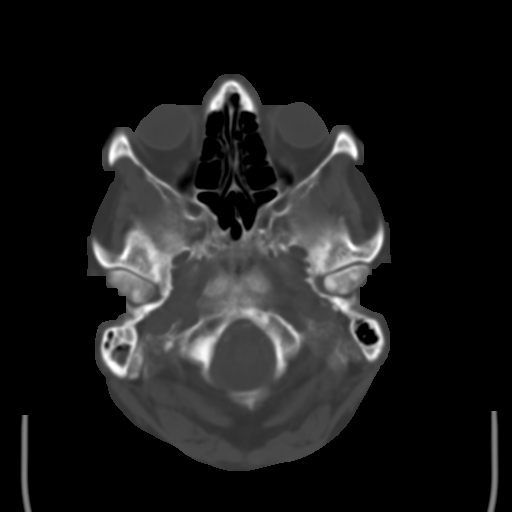
[im 6/33  brain]
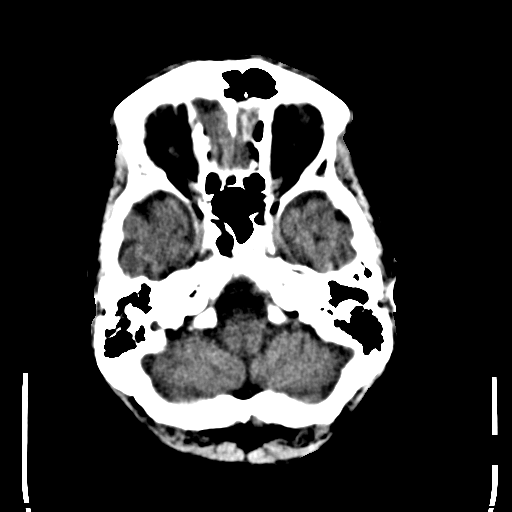
[im 9/33  brain]
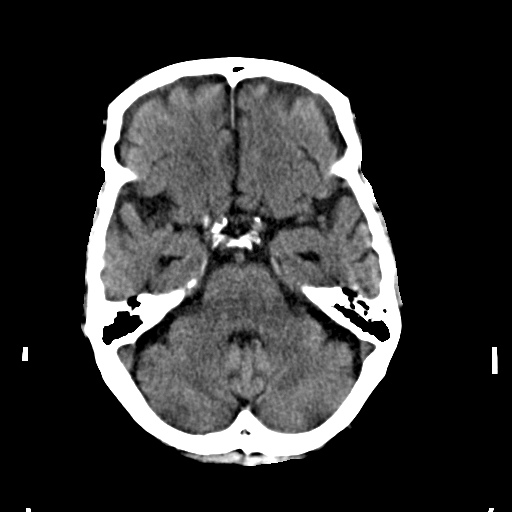
[im 12/33  brain]
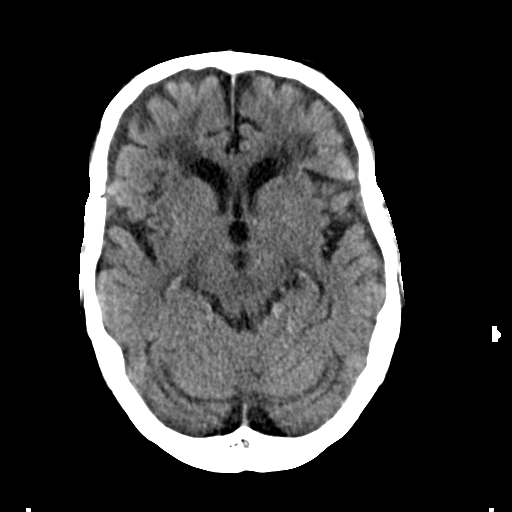
[im 15/33  brain]
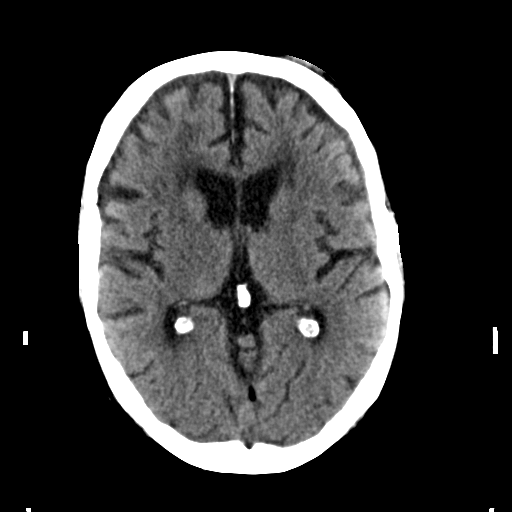
[im 15/33  bone]
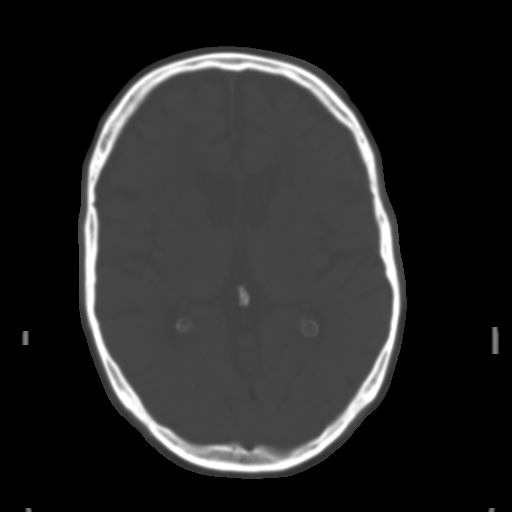
[im 18/33  brain]
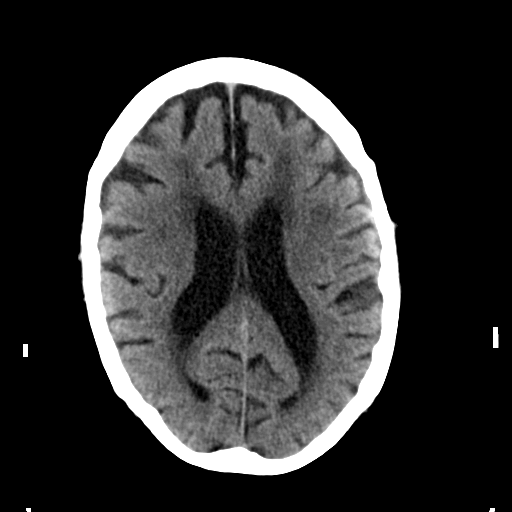
[im 21/33  brain]
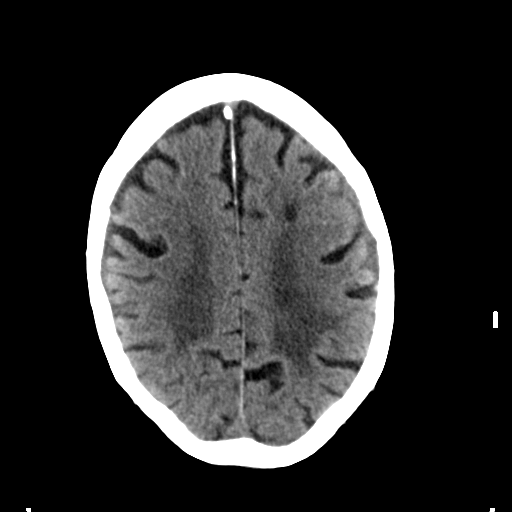
[im 25/33  brain]
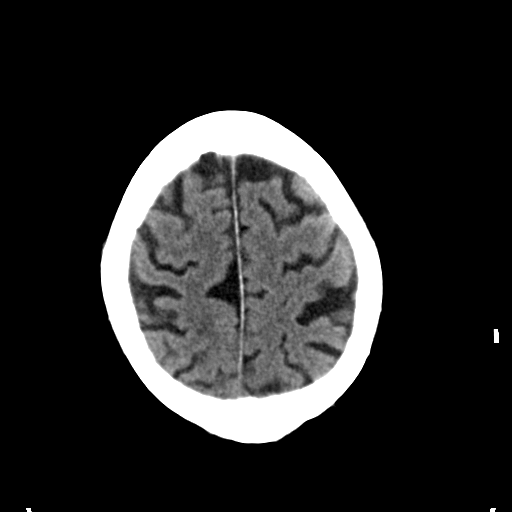
[im 27/33  brain]
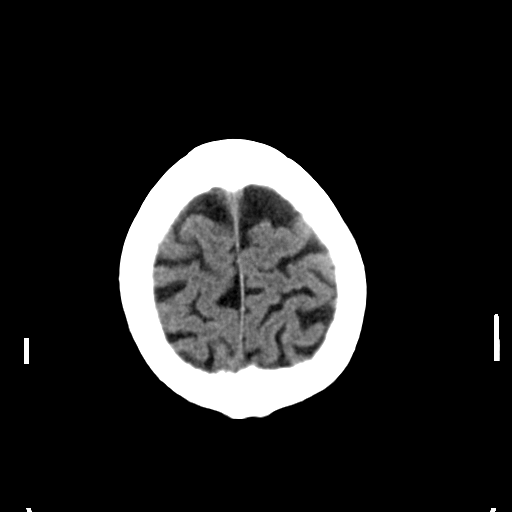
[im 27/33  bone]
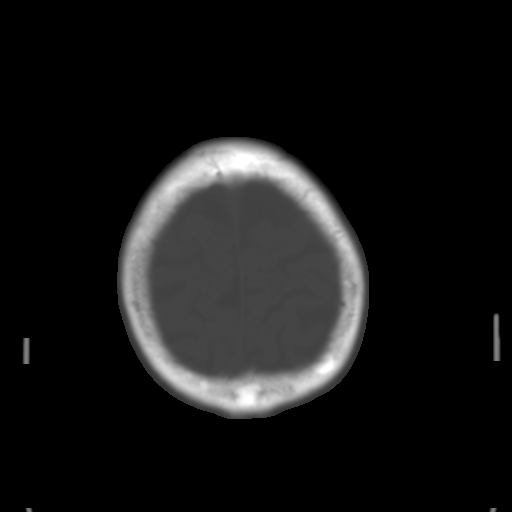
[im 30/33  brain]
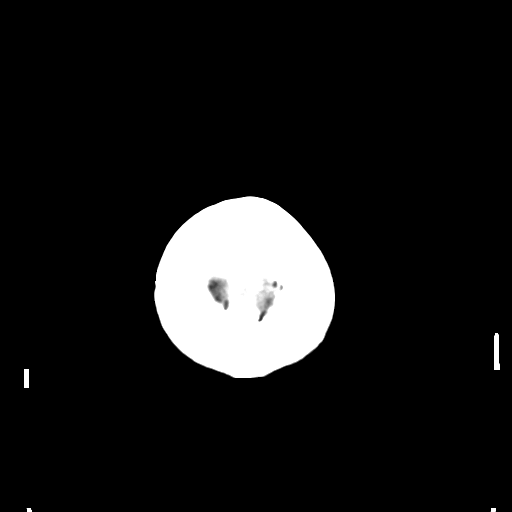

[Series 4: coronal soft tissue · coronal · 0.32mm/px · 3 of 68 slices shown]
[im 23/68  brain]
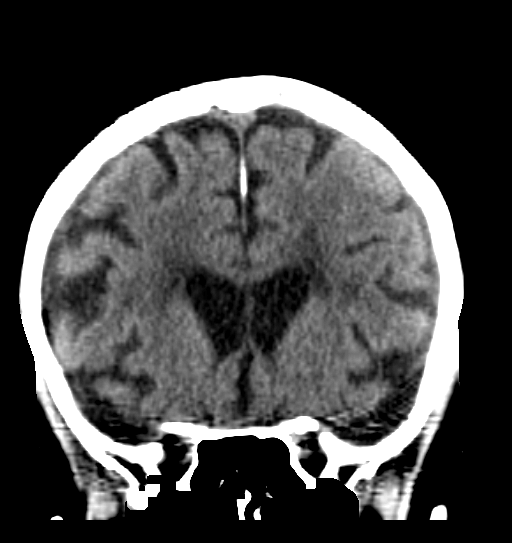
[im 30/68  brain]
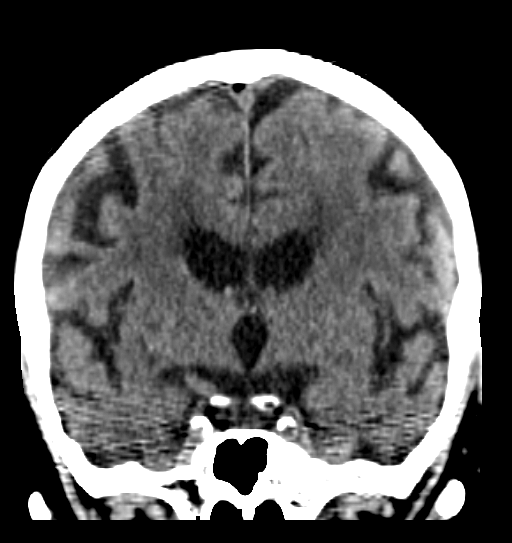
[im 38/68  brain]
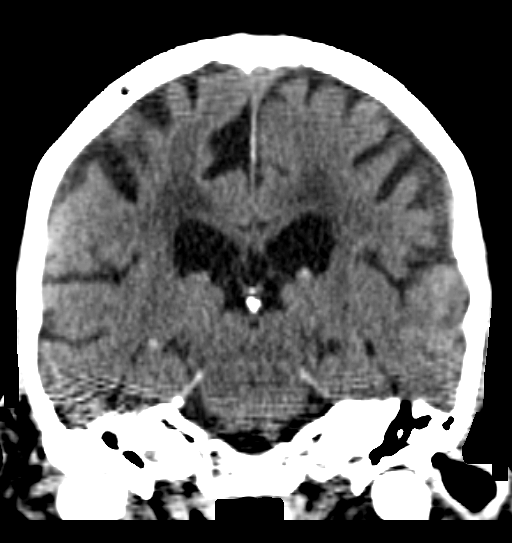

[Series 5: sagittal soft tissue · sagittal · 0.34mm/px · 3 of 51 slices shown]
[im 17/51  brain]
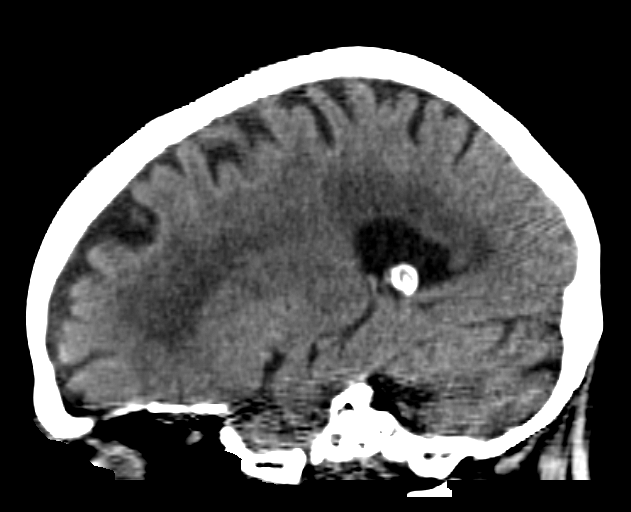
[im 26/51  brain]
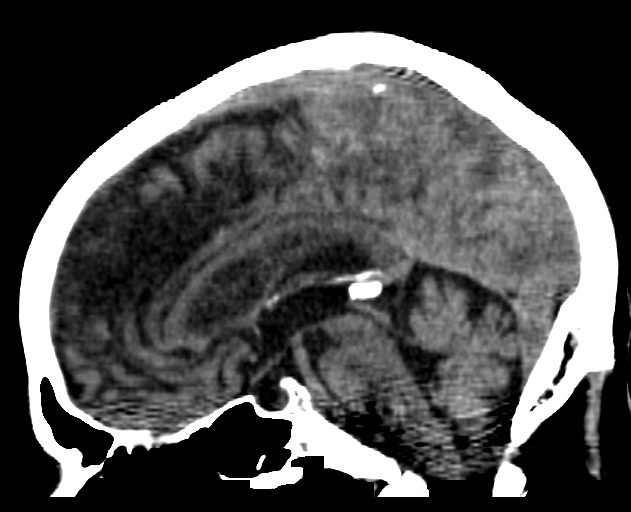
[im 34/51  brain]
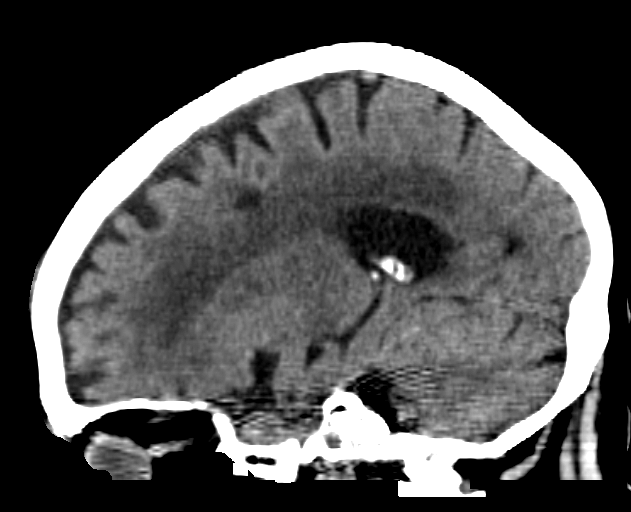

[16 of 47 positions shown; findings below may reference images not displayed]

FINDINGS: Bony calvarium appears intact. Small left frontal scalp hematoma is
noted. Mild diffuse cortical atrophy is noted. Mild chronic ischemic
white matter disease is noted. No mass effect or midline shift is
noted. Ventricular size is within normal limits. There is no
evidence of mass lesion, hemorrhage or acute infarction.
IMPRESSION: Mild diffuse cortical atrophy. Mild chronic ischemic white matter
disease. Small left frontal scalp hematoma. No acute intracranial
abnormality seen.

## 2017-01-04 ENCOUNTER — Inpatient Hospital Stay
Admission: EM | Admit: 2017-01-04 | Discharge: 2017-01-07 | DRG: 379 | Disposition: A | Discharge status: Home / Self Care | Payer: Medicare Other | Attending: Internal Medicine | Admitting: Internal Medicine

## 2017-01-04 ENCOUNTER — Encounter: Payer: Self-pay | Admitting: Emergency Medicine

## 2017-01-04 ENCOUNTER — Emergency Department: Payer: Medicare Other

## 2017-01-04 DIAGNOSIS — T45515A Adverse effect of anticoagulants, initial encounter: Secondary | ICD-10-CM | POA: Diagnosis present

## 2017-01-04 DIAGNOSIS — K922 Gastrointestinal hemorrhage, unspecified: Secondary | ICD-10-CM | POA: Diagnosis present

## 2017-01-04 DIAGNOSIS — Z87891 Personal history of nicotine dependence: Secondary | ICD-10-CM | POA: Diagnosis not present

## 2017-01-04 DIAGNOSIS — Z86010 Personal history of colonic polyps: Secondary | ICD-10-CM

## 2017-01-04 DIAGNOSIS — E785 Hyperlipidemia, unspecified: Secondary | ICD-10-CM | POA: Diagnosis present

## 2017-01-04 DIAGNOSIS — Z9221 Personal history of antineoplastic chemotherapy: Secondary | ICD-10-CM | POA: Diagnosis not present

## 2017-01-04 DIAGNOSIS — I4891 Unspecified atrial fibrillation: Secondary | ICD-10-CM | POA: Diagnosis present

## 2017-01-04 DIAGNOSIS — Z91040 Latex allergy status: Secondary | ICD-10-CM | POA: Diagnosis not present

## 2017-01-04 DIAGNOSIS — Z7901 Long term (current) use of anticoagulants: Secondary | ICD-10-CM | POA: Diagnosis not present

## 2017-01-04 DIAGNOSIS — D649 Anemia, unspecified: Secondary | ICD-10-CM | POA: Diagnosis present

## 2017-01-04 DIAGNOSIS — C61 Malignant neoplasm of prostate: Secondary | ICD-10-CM | POA: Diagnosis present

## 2017-01-04 DIAGNOSIS — K921 Melena: Secondary | ICD-10-CM | POA: Diagnosis present

## 2017-01-04 DIAGNOSIS — Z79899 Other long term (current) drug therapy: Secondary | ICD-10-CM

## 2017-01-04 DIAGNOSIS — J449 Chronic obstructive pulmonary disease, unspecified: Secondary | ICD-10-CM | POA: Diagnosis present

## 2017-01-04 DIAGNOSIS — K264 Chronic or unspecified duodenal ulcer with hemorrhage: Secondary | ICD-10-CM | POA: Diagnosis present

## 2017-01-04 DIAGNOSIS — Z8711 Personal history of peptic ulcer disease: Secondary | ICD-10-CM

## 2017-01-04 DIAGNOSIS — D500 Iron deficiency anemia secondary to blood loss (chronic): Secondary | ICD-10-CM | POA: Diagnosis present

## 2017-01-04 HISTORY — DX: Gastric ulcer, unspecified as acute or chronic, without hemorrhage or perforation: K25.9

## 2017-01-04 LAB — HEMOGLOBIN
Hemoglobin: 6.2 g/dL — ABNORMAL LOW (ref 13.0–18.0)
Hemoglobin: 8.4 g/dL — ABNORMAL LOW (ref 13.0–18.0)

## 2017-01-04 LAB — CBC WITH DIFFERENTIAL/PLATELET
BAND NEUTROPHILS: 7 %
BASOS ABS: 0.2 10*3/uL — AB (ref 0–0.1)
Basophils Relative: 1 %
Eosinophils Absolute: 0 10*3/uL (ref 0–0.7)
Eosinophils Relative: 0 %
HCT: 22.2 % — ABNORMAL LOW (ref 40.0–52.0)
Hemoglobin: 7.3 g/dL — ABNORMAL LOW (ref 13.0–18.0)
LYMPHS ABS: 1.6 10*3/uL (ref 1.0–3.6)
Lymphocytes Relative: 9 %
MCH: 37.8 pg — AB (ref 26.0–34.0)
MCHC: 32.8 g/dL (ref 32.0–36.0)
MCV: 115.2 fL — AB (ref 80.0–100.0)
METAMYELOCYTES PCT: 5 %
MONOS PCT: 6 %
Monocytes Absolute: 1.1 10*3/uL — ABNORMAL HIGH (ref 0.2–1.0)
Neutro Abs: 15.2 10*3/uL — ABNORMAL HIGH (ref 1.4–6.5)
Neutrophils Relative %: 72 %
PLATELETS: 100 10*3/uL — AB (ref 150–440)
RBC: 1.92 MIL/uL — ABNORMAL LOW (ref 4.40–5.90)
RDW: 18.5 % — ABNORMAL HIGH (ref 11.5–14.5)
WBC: 18.1 10*3/uL — ABNORMAL HIGH (ref 3.8–10.6)
nRBC: 6 /100 WBC — ABNORMAL HIGH

## 2017-01-04 LAB — URINALYSIS, COMPLETE (UACMP) WITH MICROSCOPIC
BILIRUBIN URINE: NEGATIVE
Glucose, UA: NEGATIVE mg/dL
Hgb urine dipstick: NEGATIVE
KETONES UR: NEGATIVE mg/dL
Leukocytes, UA: NEGATIVE
Nitrite: NEGATIVE
PROTEIN: 30 mg/dL — AB
SPECIFIC GRAVITY, URINE: 1.017 (ref 1.005–1.030)
SQUAMOUS EPITHELIAL / LPF: NONE SEEN
pH: 7 (ref 5.0–8.0)

## 2017-01-04 LAB — HEPATIC FUNCTION PANEL
ALBUMIN: 2.5 g/dL — AB (ref 3.5–5.0)
ALK PHOS: 194 U/L — AB (ref 38–126)
ALT: 18 U/L (ref 17–63)
AST: 29 U/L (ref 15–41)
BILIRUBIN INDIRECT: 0.8 mg/dL (ref 0.3–0.9)
BILIRUBIN TOTAL: 0.9 mg/dL (ref 0.3–1.2)
Bilirubin, Direct: 0.1 mg/dL (ref 0.1–0.5)
Total Protein: 5 g/dL — ABNORMAL LOW (ref 6.5–8.1)

## 2017-01-04 LAB — BASIC METABOLIC PANEL
Anion gap: 9 (ref 5–15)
BUN: 24 mg/dL — AB (ref 6–20)
CHLORIDE: 102 mmol/L (ref 101–111)
CO2: 23 mmol/L (ref 22–32)
CREATININE: 1.12 mg/dL (ref 0.61–1.24)
Calcium: 7.6 mg/dL — ABNORMAL LOW (ref 8.9–10.3)
GFR calc Af Amer: >60 mL/min (ref >60)
GFR calc non Af Amer: 58 mL/min — ABNORMAL LOW (ref >60)
GLUCOSE: 184 mg/dL — AB (ref 65–99)
Potassium: 4.2 mmol/L (ref 3.5–5.1)
Sodium: 134 mmol/L — ABNORMAL LOW (ref 135–145)

## 2017-01-04 LAB — IRON AND TIBC
IRON: 86 ug/dL (ref 45–182)
Saturation Ratios: 44 % — ABNORMAL HIGH (ref 17.9–39.5)
TIBC: 197 ug/dL — AB (ref 250–450)
UIBC: 111 ug/dL

## 2017-01-04 LAB — TROPONIN I

## 2017-01-04 LAB — FERRITIN: FERRITIN: 679 ng/mL — AB (ref 24–336)

## 2017-01-04 LAB — GLUCOSE, CAPILLARY
GLUCOSE-CAPILLARY: 100 mg/dL — AB (ref 65–99)
Glucose-Capillary: 101 mg/dL — ABNORMAL HIGH (ref 65–99)

## 2017-01-04 LAB — PREPARE RBC (CROSSMATCH)

## 2017-01-04 LAB — VITAMIN B12: VITAMIN B 12: 6265 pg/mL — AB (ref 180–914)

## 2017-01-04 LAB — PROTIME-INR
INR: 2.74
PROTHROMBIN TIME: 28.8 s — AB (ref 11.4–15.2)

## 2017-01-04 LAB — PATHOLOGIST SMEAR REVIEW

## 2017-01-04 LAB — FOLATE: Folate: 18.9 ng/mL (ref >5.9)

## 2017-01-04 MED ORDER — SODIUM CHLORIDE 0.9 % IV SOLN
8.0000 mg/h | INTRAVENOUS | Status: AC
  Administered 2017-01-04 – 2017-01-07 (×9): 8 mg/h via INTRAVENOUS
  Filled 2017-01-04 (×7): qty 80

## 2017-01-04 MED ORDER — PANTOPRAZOLE SODIUM 40 MG IV SOLR
40.0000 mg | Freq: Once | INTRAVENOUS | Status: AC
  Administered 2017-01-04: 40 mg via INTRAVENOUS
  Filled 2017-01-04: qty 40

## 2017-01-04 MED ORDER — MOMETASONE FURO-FORMOTEROL FUM 100-5 MCG/ACT IN AERO
2.0000 | INHALATION_SPRAY | Freq: Two times a day (BID) | RESPIRATORY_TRACT | Status: DC
  Administered 2017-01-04 – 2017-01-07 (×7): 2 via RESPIRATORY_TRACT
  Filled 2017-01-04: qty 8.8

## 2017-01-04 MED ORDER — SODIUM CHLORIDE 0.9 % IV SOLN
Freq: Once | INTRAVENOUS | Status: AC
  Administered 2017-01-04: 12:00:00 via INTRAVENOUS

## 2017-01-04 MED ORDER — SODIUM CHLORIDE 0.9 % IV SOLN
INTRAVENOUS | Status: DC
  Administered 2017-01-04 – 2017-01-06 (×5): via INTRAVENOUS
  Administered 2017-01-06: 1000 mL via INTRAVENOUS
  Administered 2017-01-07: 04:00:00 via INTRAVENOUS

## 2017-01-04 MED ORDER — TIOTROPIUM BROMIDE MONOHYDRATE 18 MCG IN CAPS
18.0000 ug | ORAL_CAPSULE | Freq: Every day | RESPIRATORY_TRACT | Status: DC
  Administered 2017-01-04 – 2017-01-07 (×4): 18 ug via RESPIRATORY_TRACT
  Filled 2017-01-04: qty 5

## 2017-01-04 MED ORDER — DOCUSATE SODIUM 100 MG PO CAPS: 100.0000 mg | ORAL_CAPSULE | Freq: Two times a day (BID) | ORAL | Status: DC | PRN

## 2017-01-04 MED ORDER — MIRTAZAPINE 15 MG PO TABS
15.0000 mg | ORAL_TABLET | Freq: Every day | ORAL | Status: DC
  Administered 2017-01-04 – 2017-01-06 (×3): 15 mg via ORAL
  Filled 2017-01-04 (×3): qty 1

## 2017-01-04 MED ORDER — TRANEXAMIC ACID 1000 MG/10ML IV SOLN
1000.0000 mg | INTRAVENOUS | Status: AC
  Administered 2017-01-04: 1000 mg via INTRAVENOUS
  Filled 2017-01-04: qty 10

## 2017-01-04 MED ORDER — SODIUM CHLORIDE 0.9 % IV SOLN
80.0000 mg | Freq: Once | INTRAVENOUS | Status: AC
  Administered 2017-01-04: 80 mg via INTRAVENOUS
  Filled 2017-01-04: qty 80

## 2017-01-04 MED ORDER — SODIUM CHLORIDE 0.9 % IV BOLUS (SEPSIS)
1000.0000 mL | Freq: Once | INTRAVENOUS | Status: AC
  Administered 2017-01-04: 1000 mL via INTRAVENOUS

## 2017-01-04 MED ORDER — ATORVASTATIN CALCIUM 20 MG PO TABS
80.0000 mg | ORAL_TABLET | Freq: Every day | ORAL | Status: DC
  Administered 2017-01-04 – 2017-01-06 (×3): 80 mg via ORAL
  Filled 2017-01-04 (×3): qty 4

## 2017-01-04 MED ORDER — PHYTONADIONE 5 MG PO TABS
5.0000 mg | ORAL_TABLET | Freq: Once | ORAL | Status: AC
  Administered 2017-01-04: 11:00:00 5 mg via ORAL
  Filled 2017-01-04: qty 1

## 2017-01-04 MED ORDER — PANTOPRAZOLE SODIUM 40 MG IV SOLR: 40.0000 mg | Freq: Two times a day (BID) | INTRAVENOUS | Status: DC

## 2017-01-04 MED ORDER — DIGOXIN 250 MCG PO TABS
0.2500 mg | ORAL_TABLET | Freq: Every day | ORAL | Status: DC
  Administered 2017-01-04 – 2017-01-07 (×4): 0.25 mg via ORAL
  Filled 2017-01-04 (×4): qty 1

## 2017-01-04 MED ORDER — LORAZEPAM 0.5 MG PO TABS: 0.2500 mg | ORAL_TABLET | ORAL | Status: DC | PRN

## 2017-01-04 MED ORDER — TRAZODONE HCL 50 MG PO TABS
50.0000 mg | ORAL_TABLET | Freq: Every evening | ORAL | Status: DC | PRN
  Administered 2017-01-04 – 2017-01-06 (×3): 50 mg via ORAL
  Filled 2017-01-04 (×3): qty 1

## 2017-01-04 NOTE — ED Provider Notes (Signed)
[facility] Emergency Department Provider Note  ____________________________________________   First MD Initiated Contact with Patient 01/04/17 [phone]     (approximate)  I have reviewed the triage vital signs and the nursing notes.   HISTORY  Chief Complaint Weakness   HPI Chad Kirby is a 83 y.o. male who comes to the emergency department via EMS with exertional shortness of breath that began several days ago but has been slowly progressive and was most notably worse this morning.  It had an insidious onset.  He has a past medical history of metastatic prostate cancer and is currently undergoing infusions of Keytruda.  He has noted constipation recently.  He does have a past medical history of GI bleed secondary to gastritis requiring blood transfusion.  He denies chest pain.  He denies leg swelling.  His symptoms seem to be worse with exertion and somewhat improved with rest.  He denies fevers or chills.  06/02/15 Endoscopy: - Tortuous esophagus. - Gastritis. - One non-obstructing non-bleeding duodenal ulcer with pigmented material. - No specimens collected.  05/18/15 Colonoscopy: - Preparation of the colon was poor. - One medium polyp in the proximal ascending colon, removed with a hot snare. Resected and retrieved. - One small polyp in the descending colon, removed with a hot snare. Resected and retrieved. - One large polyp at the recto-sigmoid colon, removed with a hot snare. Resected and retrieved. - Diverticulosis in the sigmoid colon. - The examination was otherwise normal.   Past Medical History:  Diagnosis Date  . Atrial fibrillation ([place])   . Cancer [facility])    Prostate with bone mets-current chemo treatment  . COPD (chronic obstructive pulmonary disease) ([place])   . Hyperlipidemia     Patient Active Problem List   Diagnosis Date Noted  . GIB (gastrointestinal bleeding) 05/30/2015  . Anemia 05/30/2015  . Lower GI bleed 05/15/2015  .  Cellulitis of left lower extremity   . Atrial fibrillation with RVR ([place]) 05/01/2015  . Sepsis ([place]) 05/01/2015  . Cellulitis and abscess of leg 05/01/2015    Past Surgical History:  Procedure Laterality Date  . COLONOSCOPY WITH PROPOFOL N/A 05/18/2015   Procedure: COLONOSCOPY WITH PROPOFOL;  Surgeon: Hulen Luster, MD;  Location: [facility] ENDOSCOPY;  Service: Endoscopy;  Laterality: N/A;  . ESOPHAGOGASTRODUODENOSCOPY N/A 05/19/2015   Procedure: ESOPHAGOGASTRODUODENOSCOPY (EGD);  Surgeon: Hulen Luster, MD;  Location: [facility] ENDOSCOPY;  Service: Endoscopy;  Laterality: N/A;  . ESOPHAGOGASTRODUODENOSCOPY (EGD) WITH PROPOFOL N/A 06/02/2015   Procedure: ESOPHAGOGASTRODUODENOSCOPY (EGD) WITH PROPOFOL;  Surgeon: Lollie Sails, MD;  Location[facility] ENDOSCOPY;  Service: Endoscopy;  Laterality: N/A;  patient may require intubation  . REPLACEMENT TOTAL KNEE Right     Prior to Admission medications   Medication Sig Start Date End Date Taking? Authorizing Provider  atorvastatin (LIPITOR) 80 MG tablet Take 80 mg by mouth at bedtime.   Yes [provider]  budesonide-formoterol (SYMBICORT) 80-4.5 MCG/ACT inhaler Inhale 2 puffs into the lungs 2 (two) times daily.   Yes [provider]  celecoxib (CELEBREX) 200 MG capsule Take 200 mg by mouth 2 (two) times daily.   Yes [provider]  digoxin (LANOXIN) 0.25 MG tablet Take 1 tablet (0.25 mg total) by mouth daily. 06/10/15  Yes Max Sane, MD  diltiazem (CARDIZEM CD) 180 MG 24 hr capsule Take 1 capsule (180 mg total) by mouth daily. 06/10/15  Yes Max Sane, MD  furosemide (LASIX) 20 MG tablet Take 20 mg by mouth 2 (two) times  daily.    Yes [provider]  LORazepam (ATIVAN) 0.5 MG tablet Take 0.25 mg by mouth every 4 (four) hours as needed for anxiety.   Yes [provider]  metoprolol succinate (TOPROL-XL) 25 MG 24 hr tablet Take 25 mg by mouth daily.   Yes [provider]  mirtazapine (REMERON) 15 MG tablet Take  15 mg by mouth at bedtime.   Yes [provider]  pantoprazole (PROTONIX) 40 MG tablet Take 1 tablet (40 mg total) by mouth 2 (two) times daily. 05/23/15  Yes Vaughan Basta, MD  polyethylene glycol (MIRALAX / GLYCOLAX) packet Take 17 g by mouth 2 (two) times daily. 05/06/15  Yes Gouru, Illene Silver, MD  prochlorperazine (COMPAZINE) 10 MG tablet Take 10 mg by mouth every 6 (six) hours as needed. For nausea and vomiting. 04/14/15  Yes [provider]  senna-docusate (SENOKOT-S) 8.6-50 MG tablet Take 2 tablets by mouth 2 (two) times daily. 05/06/15  Yes Gouru, Illene Silver, MD  tiotropium (SPIRIVA) 18 MCG inhalation capsule Place 18 mcg into inhaler and inhale daily.   Yes [provider]  traZODone (DESYREL) 50 MG tablet Take 50 mg by mouth at bedtime as needed for sleep.   Yes [provider]  warfarin (COUMADIN) 3 MG tablet Take 3 mg by mouth daily.   Yes [provider]  collagenase (SANTYL) ointment Apply 1 application topically 2 (two) times daily. Apply a thick layer to posterior left lower leg, cover with NS damp to dry gauze, wrap with kerlix    [provider]  diltiazem (CARDIZEM) 60 MG tablet Take 1 tablet (60 mg total) by mouth every 6 (six) hours as needed (HR>100). Patient not taking: Reported on 01/04/2017 06/10/15   Max Sane, MD  feeding supplement, ENSURE ENLIVE, (ENSURE ENLIVE) LIQD Take 237 mLs by mouth 3 (three) times daily with meals. 06/10/15   Max Sane, MD  gabapentin (NEURONTIN) 300 MG capsule Take 1 capsule (300 mg total) by mouth 3 (three) times daily. Patient not taking: Reported on 01/04/2017 06/10/15   Max Sane, MD    Allergies Latex  Family History  Problem Relation Age of Onset  . Brain cancer Sister   . Stroke Father     Social History Social History  Substance Use Topics  . Smoking status: Former Research scientist (life sciences)  . Smokeless tobacco: Not on file  . Alcohol use 0.0 oz/week     Comment: occasional    Review of  Systems Constitutional: No fever/chills Eyes: No visual changes. ENT: No sore throat. Cardiovascular: Denies chest pain. Respiratory: Positive for shortness of breath. Gastrointestinal: No abdominal pain.  No nausea, no vomiting.  No diarrhea.  Positive for constipation. Genitourinary: Negative for dysuria. Musculoskeletal: Negative for back pain. Skin: Negative for rash. Neurological: Negative for headaches, focal weakness or numbness.   ____________________________________________   PHYSICAL EXAM:  VITAL SIGNS: ED Triage Vitals  Enc Vitals Group     BP 01/04/17 0546 103/75     Pulse Rate 01/04/17 0546 (!) 114     Resp 01/04/17 0546 20     Temp 01/04/17 0546 (!) 97.5 F (36.4 C)     Temp Source 01/04/17 0546 Oral     SpO2 01/04/17 0546 100 %     Weight 01/04/17 0547 217 lb (98.4 kg)     Height 01/04/17 0547 6' (1.829 m)     Head Circumference --      Peak Flow --      Pain Score --  Pain Loc --      Pain Edu? --      Excl. in [place]? --     Constitutional: Alert and oriented x4 appears quite pale elevated respiratory rate Eyes: PERRL EOMI. significant conjunctival pallor Head: Atraumatic. Nose: No congestion/rhinnorhea. Mouth/Throat: No trismus Neck: No stridor.   Cardiovascular: Irregularly irregular and tachycardic Respiratory: Increased respiratory effort.  No retractions. Lungs CTAB and moving good air Gastrointestinal: Soft nondistended nontender no rebound or guarding no peritonitis Guaiac positive control positive brown stool Musculoskeletal: No lower extremity edema   Neurologic:  Normal speech and language. No gross focal neurologic deficits are appreciated. Skin:  Skin is warm, dry and intact. No rash noted. Psychiatric: Mood and affect are normal. Speech and behavior are normal.    ____________________________________________   DIFFERENTIAL includes but not limited to  Upper GI bleed, lower GI bleed, sepsis, urinary tract infection, metabolic  derangement, acute coronary syndrome ____________________________________________   LABS (all labs ordered are listed, but only abnormal results are displayed)  Labs Reviewed  BASIC METABOLIC PANEL - Abnormal; Notable for the following:       Result Value   Sodium 134 (*)    Glucose, Bld 184 (*)    BUN 24 (*)    Calcium 7.6 (*)    GFR calc non Af Amer 58 (*)    All other components within normal limits  HEPATIC FUNCTION PANEL - Abnormal; Notable for the following:    Total Protein 5.0 (*)    Albumin 2.5 (*)    Alkaline Phosphatase 194 (*)    All other components within normal limits  CBC WITH DIFFERENTIAL/PLATELET - Abnormal; Notable for the following:    WBC 18.1 (*)    RBC 1.92 (*)    Hemoglobin 7.3 (*)    HCT 22.2 (*)    MCV 115.2 (*)    MCH 37.8 (*)    RDW 18.5 (*)    Platelets 100 (*)    All other components within normal limits  TROPONIN I  URINALYSIS, COMPLETE (UACMP) WITH MICROSCOPIC  PROTIME-INR  TYPE AND SCREEN  PREPARE RBC (CROSSMATCH)    Blood work reviewed and interpreted by me shows hemoglobin of 7.3 __________________________________________  EKG  ED ECG REPORT I, Darel Hong, the attending physician, personally viewed and interpreted this ECG.  Date: 01/04/2017 EKG Time:  Rate: 106 Rhythm: Atrial fibrillation with rapid ventricular response QRS Axis: normal Intervals: normal ST/T Wave abnormalities: normal Narrative Interpretation: no evidence of acute ischemia however abnormal EKG  ____________________________________________  RADIOLOGY  Chest x-ray reviewed by me shows chronic findings but no acute disease ____________________________________________   PROCEDURES  Procedure(s) performed: no  Procedures  Critical Care performed: yes  CRITICAL CARE Performed by: Darel Hong   Total critical care time: 30 minutes  Critical care time was exclusive of separately billable procedures and treating other patients.  Critical  care was necessary to treat or prevent imminent or life-threatening deterioration.  Critical care was time spent personally by me on the following activities: development of treatment plan with patient and/or surrogate as well as nursing, discussions with consultants, evaluation of patient's response to treatment, examination of patient, obtaining history from patient or surrogate, ordering and performing treatments and interventions, ordering and review of laboratory studies, ordering and review of radiographic studies, pulse oximetry and re-evaluation of patient's condition.   Observation: no ____________________________________________   INITIAL IMPRESSION / ASSESSMENT AND PLAN / ED COURSE  Pertinent labs & imaging results that were available during my  care of the patient were reviewed by me and considered in my medical decision making (see chart for details).  The patient arrives tachycardic, short of breath, pale appearing.  Rectal exam confirms significant guaiac positive stool.  At this point while I am waiting for his hemoglobin I will give him 1 L of fluid, but I do anticipate a blood transfusion.     __----------------------------------------- 6:28 AM on 01/04/2017 -----------------------------------------  Patient's hemoglobin just came back at 7.3 down from 12 last time he was here and down from 9.11-week ago.  He is currently quite symptomatic.  At this point given his exertional fatigue, hypotension, and shortness of breath he requires inpatient admission for type-specific blood transfusion.   The patient's MCV is quite high which is unusual for iron deficiency.  I have ordered haptoglobin, B12, folate, and iron indices prior to transfusion.  __________________________________________   FINAL CLINICAL IMPRESSION(S) / ED DIAGNOSES  Final diagnoses:  Gastrointestinal hemorrhage, unspecified gastrointestinal hemorrhage type  Symptomatic anemia      NEW MEDICATIONS STARTED  DURING THIS VISIT:  New Prescriptions   No medications on file     Note:  This document was prepared using Dragon voice recognition software and may include unintentional dictation errors.     Darel Hong, MD 01/04/17 [phone]

## 2017-01-04 NOTE — ED Triage Notes (Signed)
Pt in with co generalized weakness x 2 week, pt is getting chemo for prostate CA. States today felt shob, denies any hx of lung disease. Denies any pain, no n,v,d, at this time.

## 2017-01-04 NOTE — Consult Note (Signed)
[facility] Consultation Note  Patient ID: Chad Kirby, MRN: [id number], DOB/AGE: [birth year] 81 y.o. Admit date: 01/04/2017   Date of Consult: 01/04/2017 Primary Physician: [facility] Primary Cardiologist: Paraschos  Chief Complaint:  Chief Complaint  Patient presents with  . Weakness   Reason for Consult: atrial fibrillation  HPI: 83 y.o. male with known chronic nonvalvular atrial fibrillation for which the patient has been on appropriate medication management including jaw oxygen for heart rate control which has been controlled for quite some time. He has had appropriate medication management for anticoagulation with warfarin and had a peptic ulcer and GI bleed last year. The patient had a healing of his peptic ulcer and then had reinstatement of warfarin. Recently the patient has had a reinstatement of prostate cancer chemotherapy which may have stressed his stomach and cause more peptic ulcer and now with significant GI bleed and hemoglobin dropped with significant anemia weakness and fatigue. He is hemodynamically stable at this time with no evidence of congestive heart failure myocardial infarction or chest pain. We have discussed that he will need to discontinuation of Coumadin until further assessment treatment and resolution of his peptic ulcer  Past Medical History:  Diagnosis Date  . Atrial fibrillation ([place])   . Cancer [facility])    Prostate with bone mets-current chemo treatment  . COPD (chronic obstructive pulmonary disease) ([place])   . Gastric ulcer   . Hyperlipidemia       Surgical History:  Past Surgical History:  Procedure Laterality Date  . COLONOSCOPY WITH PROPOFOL N/A 05/18/2015   Procedure: COLONOSCOPY WITH PROPOFOL;  Surgeon: Hulen Luster, MD;  Location: [facility] ENDOSCOPY;  Service: Endoscopy;  Laterality: N/A;  . ESOPHAGOGASTRODUODENOSCOPY N/A 05/19/2015   Procedure: ESOPHAGOGASTRODUODENOSCOPY (EGD);  Surgeon: Hulen Luster, MD;  Location: [facility]  ENDOSCOPY;  Service: Endoscopy;  Laterality: N/A;  . ESOPHAGOGASTRODUODENOSCOPY (EGD) WITH PROPOFOL N/A 06/02/2015   Procedure: ESOPHAGOGASTRODUODENOSCOPY (EGD) WITH PROPOFOL;  Surgeon: Lollie Sails, MD;  Location: [facility] ENDOSCOPY;  Service: Endoscopy;  Laterality: N/A;  patient may require intubation  . REPLACEMENT TOTAL KNEE Right      Home Meds: Prior to Admission medications   Medication Sig Start Date End Date Taking? Authorizing Provider  atorvastatin (LIPITOR) 80 MG tablet Take 80 mg by mouth at bedtime.   Yes [provider]  budesonide-formoterol (SYMBICORT) 80-4.5 MCG/ACT inhaler Inhale 2 puffs into the lungs 2 (two) times daily.   Yes [provider]  celecoxib (CELEBREX) 200 MG capsule Take 200 mg by mouth 2 (two) times daily.   Yes [provider]  digoxin (LANOXIN) 0.25 MG tablet Take 1 tablet (0.25 mg total) by mouth daily. 06/10/15  Yes Max Sane, MD  diltiazem (CARDIZEM CD) 180 MG 24 hr capsule Take 1 capsule (180 mg total) by mouth daily. 06/10/15  Yes Max Sane, MD  furosemide (LASIX) 20 MG tablet Take 20 mg by mouth 2 (two) times daily.    Yes [provider]  LORazepam (ATIVAN) 0.5 MG tablet Take 0.25 mg by mouth every 4 (four) hours as needed for anxiety.   Yes [provider]  metoprolol succinate (TOPROL-XL) 25 MG 24 hr tablet Take 25 mg by mouth daily.   Yes [provider]  mirtazapine (REMERON) 15 MG tablet Take 15 mg by mouth at bedtime.   Yes [provider]  pantoprazole (PROTONIX) 40 MG tablet Take 1 tablet (40 mg total) by mouth 2 (two) times daily. 05/23/15  Yes Vaughan Basta,  MD  polyethylene glycol (MIRALAX / GLYCOLAX) packet Take 17 g by mouth 2 (two) times daily. 05/06/15  Yes Gouru, Illene Silver, MD  prochlorperazine (COMPAZINE) 10 MG tablet Take 10 mg by mouth every 6 (six) hours as needed. For nausea and vomiting. 04/14/15  Yes [provider]  senna-docusate (SENOKOT-S) 8.6-50 MG  tablet Take 2 tablets by mouth 2 (two) times daily. 05/06/15  Yes Gouru, Illene Silver, MD  tiotropium (SPIRIVA) 18 MCG inhalation capsule Place 18 mcg into inhaler and inhale daily.   Yes [provider]  traZODone (DESYREL) 50 MG tablet Take 50 mg by mouth at bedtime as needed for sleep.   Yes [provider]  warfarin (COUMADIN) 3 MG tablet Take 3 mg by mouth daily.   Yes [provider]  collagenase (SANTYL) ointment Apply 1 application topically 2 (two) times daily. Apply a thick layer to posterior left lower leg, cover with NS damp to dry gauze, wrap with kerlix    [provider]  diltiazem (CARDIZEM) 60 MG tablet Take 1 tablet (60 mg total) by mouth every 6 (six) hours as needed (HR>100). Patient not taking: Reported on 01/04/2017 06/10/15   Max Sane, MD  feeding supplement, ENSURE ENLIVE, (ENSURE ENLIVE) LIQD Take 237 mLs by mouth 3 (three) times daily with meals. 06/10/15   Max Sane, MD  gabapentin (NEURONTIN) 300 MG capsule Take 1 capsule (300 mg total) by mouth 3 (three) times daily. Patient not taking: Reported on 01/04/2017 06/10/15   Max Sane, MD    Inpatient Medications:  . atorvastatin  80 mg Oral QHS  . digoxin  0.25 mg Oral Daily  . mirtazapine  15 mg Oral QHS  . mometasone-formoterol  2 puff Inhalation BID  . [START ON 01/07/2017] pantoprazole  40 mg Intravenous Q12H  . tiotropium  18 mcg Inhalation Daily   . sodium chloride    . pantoprozole (PROTONIX) infusion 8 mg/hr (01/04/17 1123)    Allergies:  Allergies  Allergen Reactions  . Latex Rash    Social History   Social History  . Marital status: Married    Spouse name: N/A  . Number of children: N/A  . Years of education: N/A   Occupational History  . Not on file.   Social History Main Topics  . Smoking status: Former Research scientist (life sciences)  . Smokeless tobacco: Never Used  . Alcohol use 0.0 oz/week     Comment: occasional  . Drug use: Unknown  . Sexual activity: Not on file   Other  Topics Concern  . Not on file   Social History Narrative  . No narrative on file     Family History  Problem Relation Age of Onset  . Brain cancer Sister   . Stroke Father      Review of Systems Positive for weakness fatigue Negative for: General:  chills, fever, night sweats or weight changes.  Cardiovascular: PND orthopnea syncope dizziness  Dermatological skin lesions rashes Respiratory: Cough congestion Urologic: Frequent urination urination at night and hematuria Abdominal: negative for nausea, vomiting, diarrhea, positive for bright red blood per rectum, melena,  Neurologic: negative for visual changes, and/or hearing changes  All other systems reviewed and are otherwise negative except as noted above.  Labs:  Recent Labs  01/04/17 0552  TROPONINI <0.03   Lab Results  Component Value Date   WBC 18.1 (H) 01/04/2017   HGB 6.2 (L) 01/04/2017   HCT 22.2 (L) 01/04/2017   MCV 115.2 (H) 01/04/2017   PLT 100 (L)  01/04/2017    Recent Labs Lab 01/04/17 0552  NA 134*  K 4.2  CL 102  CO2 23  BUN 24*  CREATININE 1.12  CALCIUM 7.6*  PROT 5.0*  BILITOT 0.9  ALKPHOS 194*  ALT 18  AST 29  GLUCOSE 184*   No results found for: CHOL, HDL, LDLCALC, TRIG No results found for: DDIMER  Radiology/Studies:  Dg Chest Port 1 View  Result Date: 01/04/2017 CLINICAL DATA:  NT EXAM: PORTABLE CHEST 1 VIEW COMPARISON:  Chest radiograph 05/21/2015 FINDINGS: Cardiomegaly is unchanged. The left hemidiaphragm remains elevated. There is bibasilar atelectasis. No focal airspace consolidation or pulmonary edema. No pneumothorax or sizable pleural effusion. Previously demonstrated sclerotic mid to lower thoracic vertebral bodies are poorly visualized on the current study due to technical factors. IMPRESSION: Cardiomegaly and unchanged elevation of the left hemidiaphragm with bibasilar atelectasis. No acute cardiopulmonary disease. Electronically Signed   By: Ulyses Jarred M.D.   On:  01/04/2017 06:13    EKG: Atrial fibrillation with controlled ventricular rate and nonspecific ST changes  Weights: Filed Weights   01/04/17 0547 01/04/17 1033  Weight: 98.4 kg (217 lb) 98.2 kg (216 lb 8 oz)     Physical Exam: Blood pressure (!) 101/51, pulse (!) 109, temperature 97.7 F (36.5 C), temperature source Oral, resp. rate 18, height 6\' 1"  (1.854 m), weight 98.2 kg (216 lb 8 oz), SpO2 95 %. Body mass index is 28.56 kg/m. General: Well developed, well nourished, in no acute distress. Head eyes ears nose throat: Normocephalic, atraumatic, sclera non-icteric, no xanthomas, nares are without discharge. No apparent thyromegaly and/or mass  Lungs: Normal respiratory effort. Few wheezes, no rales, no rhonchi.  Heart: Irregular with normal S1 S2. no murmur gallop, no rub, PMI is normal size and placement, carotid upstroke normal without bruit, jugular venous pressure is normal Abdomen: Soft, non-tender, non-distended with normoactive bowel sounds. No hepatomegaly. No rebound/guarding. No obvious abdominal masses. Abdominal aorta is normal size without bruit Extremities: No edema. no cyanosis, no clubbing, no ulcers  Peripheral : 2+ bilateral upper extremity pulses, 2+ bilateral femoral pulses, 2+ bilateral dorsal pedal pulse Neuro: Alert and oriented. No facial asymmetry. No focal deficit. Moves all extremities spontaneously. Musculoskeletal: Normal muscle tone without kyphosis Psych:  Responds to questions appropriately with a normal affect.    Assessment: 83 year old male with recurrent peptic ulcer disease and likely GI bleed from upper GI possibly from stress from chemotherapy having a need for discontinuation of Coumadin  Plan: 1. Continue supportive care for GI bleed with the treatment of anemia and packed red blood cells 2. Continue digoxin for heart rate control of atrial fibrillation 3. No reinstatement of Coumadin until full resolution of GI bleed and treatment 4.  Proceed to upper endoscopy when needed with patient being at lowest risk possible  Signed, Corey Skains M.D. [facility] 01/04/2017, 3:33 PM

## 2017-01-04 NOTE — ED Notes (Signed)
ED Provider at bedside. 

## 2017-01-04 NOTE — H&P (Signed)
[facility] NAME: Chad Kirby    MR#:  [id number]  DATE OF BIRTH:  August 15, 1929  DATE OF ADMISSION:  01/04/2017  PRIMARY CARE PHYSICIAN: Shari Prows, [facility]   REQUESTING/REFERRING PHYSICIAN: Rifenbark  CHIEF COMPLAINT:   Chief Complaint  Patient presents with  . Weakness    HISTORY OF PRESENT ILLNESS: Chad Kirby  is a 83 y.o. male with a known history of A fib on coumadin, Prostate cancer on chemo, COPD, peptic ulcer disease in past, Hyperlipidemia- had a dark stool yesterday. Feeling Very SOB with exertion and tired, so came to ER> Noted a drop in Hb from > 9 to 7.3 in last 5 days. Tachycardiac and hypotensive in ER> GUiac positive per ER physician. They ordered 2 units PRBC transfusion, gave 1 ltr NS bolus and Called for admission. Pt denies any abd pain, any frank blood in stool, any vomiting or nausea.  PAST MEDICAL HISTORY:   Past Medical History:  Diagnosis Date  . Atrial fibrillation ([place])   . Cancer [facility])    Prostate with bone mets-current chemo treatment  . COPD (chronic obstructive pulmonary disease) ([place])   . Gastric ulcer   . Hyperlipidemia     PAST SURGICAL HISTORY: Past Surgical History:  Procedure Laterality Date  . COLONOSCOPY WITH PROPOFOL N/A 05/18/2015   Procedure: COLONOSCOPY WITH PROPOFOL;  Surgeon: Hulen Luster, MD;  Location: [facility] ENDOSCOPY;  Service: Endoscopy;  Laterality: N/A;  . ESOPHAGOGASTRODUODENOSCOPY N/A 05/19/2015   Procedure: ESOPHAGOGASTRODUODENOSCOPY (EGD);  Surgeon: Hulen Luster, MD;  Location: [facility] ENDOSCOPY;  Service: Endoscopy;  Laterality: N/A;  . ESOPHAGOGASTRODUODENOSCOPY (EGD) WITH PROPOFOL N/A 06/02/2015   Procedure: ESOPHAGOGASTRODUODENOSCOPY (EGD) WITH PROPOFOL;  Surgeon: Lollie Sails, MD;  Location: [facility] ENDOSCOPY;  Service: Endoscopy;  Laterality: N/A;  patient may require intubation  . REPLACEMENT TOTAL KNEE Right     SOCIAL HISTORY:  Social History  Substance Use  Topics  . Smoking status: Former Research scientist (life sciences)  . Smokeless tobacco: Not on file  . Alcohol use 0.0 oz/week     Comment: occasional    FAMILY HISTORY:  Family History  Problem Relation Age of Onset  . Brain cancer Sister   . Stroke Father     DRUG ALLERGIES:  Allergies  Allergen Reactions  . Latex Rash    REVIEW OF SYSTEMS:   CONSTITUTIONAL: No fever, positive for fatigue or weakness.  EYES: No blurred or double vision.  EARS, NOSE, AND THROAT: No tinnitus or ear pain.  RESPIRATORY: No cough,have shortness of breath, no wheezing or hemoptysis.  CARDIOVASCULAR: No chest pain, orthopnea, edema.  GASTROINTESTINAL: No nausea, vomiting, diarrhea or abdominal pain.  GENITOURINARY: No dysuria, hematuria.  ENDOCRINE: No polyuria, nocturia,  HEMATOLOGY: No anemia, easy bruising or bleeding SKIN: No rash or lesion. MUSCULOSKELETAL: No joint pain or arthritis.   NEUROLOGIC: No tingling, numbness, weakness.  PSYCHIATRY: No anxiety or depression.   MEDICATIONS AT HOME:  Prior to Admission medications   Medication Sig Start Date End Date Taking? Authorizing Provider  atorvastatin (LIPITOR) 80 MG tablet Take 80 mg by mouth at bedtime.   Yes [provider]  budesonide-formoterol (SYMBICORT) 80-4.5 MCG/ACT inhaler Inhale 2 puffs into the lungs 2 (two) times daily.   Yes [provider]  celecoxib (CELEBREX) 200 MG capsule Take 200 mg by mouth 2 (two) times daily.   Yes [provider]  digoxin (LANOXIN) 0.25 MG tablet Take 1 tablet (0.25 mg total) by mouth daily.  06/10/15  Yes Max Sane, MD  diltiazem (CARDIZEM CD) 180 MG 24 hr capsule Take 1 capsule (180 mg total) by mouth daily. 06/10/15  Yes Max Sane, MD  furosemide (LASIX) 20 MG tablet Take 20 mg by mouth 2 (two) times daily.    Yes [provider]  LORazepam (ATIVAN) 0.5 MG tablet Take 0.25 mg by mouth every 4 (four) hours as needed for anxiety.   Yes [provider]  metoprolol succinate  (TOPROL-XL) 25 MG 24 hr tablet Take 25 mg by mouth daily.   Yes [provider]  mirtazapine (REMERON) 15 MG tablet Take 15 mg by mouth at bedtime.   Yes [provider]  pantoprazole (PROTONIX) 40 MG tablet Take 1 tablet (40 mg total) by mouth 2 (two) times daily. 05/23/15  Yes Vaughan Basta, MD  polyethylene glycol (MIRALAX / GLYCOLAX) packet Take 17 g by mouth 2 (two) times daily. 05/06/15  Yes Gouru, Illene Silver, MD  prochlorperazine (COMPAZINE) 10 MG tablet Take 10 mg by mouth every 6 (six) hours as needed. For nausea and vomiting. 04/14/15  Yes [provider]  senna-docusate (SENOKOT-S) 8.6-50 MG tablet Take 2 tablets by mouth 2 (two) times daily. 05/06/15  Yes Gouru, Illene Silver, MD  tiotropium (SPIRIVA) 18 MCG inhalation capsule Place 18 mcg into inhaler and inhale daily.   Yes [provider]  traZODone (DESYREL) 50 MG tablet Take 50 mg by mouth at bedtime as needed for sleep.   Yes [provider]  warfarin (COUMADIN) 3 MG tablet Take 3 mg by mouth daily.   Yes [provider]  collagenase (SANTYL) ointment Apply 1 application topically 2 (two) times daily. Apply a thick layer to posterior left lower leg, cover with NS damp to dry gauze, wrap with kerlix    [provider]  diltiazem (CARDIZEM) 60 MG tablet Take 1 tablet (60 mg total) by mouth every 6 (six) hours as needed (HR>100). Patient not taking: Reported on 01/04/2017 06/10/15   Max Sane, MD  feeding supplement, ENSURE ENLIVE, (ENSURE ENLIVE) LIQD Take 237 mLs by mouth 3 (three) times daily with meals. 06/10/15   Max Sane, MD  gabapentin (NEURONTIN) 300 MG capsule Take 1 capsule (300 mg total) by mouth 3 (three) times daily. Patient not taking: Reported on 01/04/2017 06/10/15   Max Sane, MD      PHYSICAL EXAMINATION:   VITAL SIGNS: Blood pressure (!) 100/54, pulse 98, temperature (!) 97.5 F (36.4 C), temperature source Oral, resp. rate 20, height 6' (1.829 m), weight 98.4  kg (217 lb), SpO2 97 %.  GENERAL:  83 y.o.-year-old patient lying in the bed with no acute distress.  EYES: Pupils equal, round, reactive to light and accommodation. No scleral icterus. Extraocular muscles intact.  HEENT: Head atraumatic, normocephalic. Oropharynx and nasopharynx clear.  NECK:  Supple, no jugular venous distention. No thyroid enlargement, no tenderness.  LUNGS: Normal breath sounds bilaterally, no wheezing, rales,rhonchi or crepitation. No use of accessory muscles of respiration. Fast breathing. CARDIOVASCULAR: S1, S2 fast and regular. No murmurs, rubs, or gallops.  ABDOMEN: Soft, nontender, nondistended. Bowel sounds present. No organomegaly or mass.  EXTREMITIES: No pedal edema, cyanosis, or clubbing.  NEUROLOGIC: Cranial nerves II through XII are intact. Muscle strength 5/5 in all extremities. Sensation intact. Gait not checked.  PSYCHIATRIC: The patient is alert and oriented x 3.  SKIN: No obvious rash, lesion, or ulcer.   LABORATORY PANEL:   CBC  Recent Labs Lab 01/04/17 0552  WBC 18.1*  HGB  7.3*  HCT 22.2*  PLT 100*  MCV 115.2*  MCH 37.8*  MCHC 32.8  RDW 18.5*  LYMPHSABS 1.6  MONOABS 1.1*  EOSABS 0.0  BASOSABS 0.2*   ------------------------------------------------------------------------------------------------------------------  Chemistries   Recent Labs Lab 01/04/17 0552  NA 134*  K 4.2  CL 102  CO2 23  GLUCOSE 184*  BUN 24*  CREATININE 1.12  CALCIUM 7.6*  AST 29  ALT 18  ALKPHOS 194*  BILITOT 0.9   ------------------------------------------------------------------------------------------------------------------ estimated creatinine clearance is 58.6 mL/min (by C-G formula based on SCr of 1.12 mg/dL). ------------------------------------------------------------------------------------------------------------------ No results for input(s): TSH, T4TOTAL, T3FREE, THYROIDAB in the last 72 hours.  Invalid input(s):  FREET3   Coagulation profile  Recent Labs Lab 01/04/17 0552  INR 2.74   ------------------------------------------------------------------------------------------------------------------- No results for input(s): DDIMER in the last 72 hours. -------------------------------------------------------------------------------------------------------------------  Cardiac Enzymes  Recent Labs Lab 01/04/17 0552  TROPONINI <0.03   ------------------------------------------------------------------------------------------------------------------ Invalid input(s): POCBNP  ---------------------------------------------------------------------------------------------------------------  Urinalysis    Component Value Date/Time   COLORURINE YELLOW (A) 07/16/2015 1845   APPEARANCEUR CLEAR (A) 07/16/2015 1845   LABSPEC 1.011 07/16/2015 1845   PHURINE 7.0 07/16/2015 1845   GLUCOSEU NEGATIVE 07/16/2015 1845   HGBUR NEGATIVE 07/16/2015 1845   BILIRUBINUR NEGATIVE 07/16/2015 1845   KETONESUR NEGATIVE 07/16/2015 1845   PROTEINUR NEGATIVE 07/16/2015 1845   NITRITE NEGATIVE 07/16/2015 1845   LEUKOCYTESUR NEGATIVE 07/16/2015 1845     RADIOLOGY: Dg Chest Port 1 View  Result Date: 01/04/2017 CLINICAL DATA:  NT EXAM: PORTABLE CHEST 1 VIEW COMPARISON:  Chest radiograph 05/21/2015 FINDINGS: Cardiomegaly is unchanged. The left hemidiaphragm remains elevated. There is bibasilar atelectasis. No focal airspace consolidation or pulmonary edema. No pneumothorax or sizable pleural effusion. Previously demonstrated sclerotic mid to lower thoracic vertebral bodies are poorly visualized on the current study due to technical factors. IMPRESSION: Cardiomegaly and unchanged elevation of the left hemidiaphragm with bibasilar atelectasis. No acute cardiopulmonary disease. Electronically Signed   By: Ulyses Jarred M.D.   On: 01/04/2017 06:13    EKG: Orders placed or performed during the hospital encounter of  01/04/17  . EKG 12-Lead  . EKG 12-Lead  . ED EKG  . ED EKG    IMPRESSION AND PLAN:  * Symptomatic anemia   Acute GI bleed   Acquired coagulopathy due to COumadin.    2 Unit PRBC ordered by ER.   Hold Warfarin.   Vitamin K oral.   Monitor Hb Q8 hrly   IV fluids, IV protonix drip.   Liquid diet.   I spoke to GI, No EGD today, likely tomorrow, once INR comes down some.    * A fib   Cont digoxin   Hold cardizem and metoprolol as pt is hypotensive and tachy   Monitor on cardiac monitor.   Cardio consult.may need to stop coumadin now , as this is second bleeding episode  * Prostate cancer   Cont to follow at [facility].  * Hyperlipidemia   Cont Statin.  All the records are reviewed and case discussed with ED provider. Management plans discussed with the patient, family and they are in agreement.  CODE STATUS: Full. Code Status History    Date Active Date Inactive Code Status Order ID Comments User Context   05/30/2015  7:17 PM 06/10/2015  4:57 PM DNR [id number]  Demetrios Loll, MD Inpatient   05/15/2015  7:54 PM 05/23/2015 11:07 PM DNR [id number]  Hillary Bow, MD ED   05/01/2015  8:33 PM 05/06/2015  9:24 PM Full  Code [id number]  Demetrios Loll, MD Inpatient    Questions for Most Recent Historical Code Status (Order [id number])    Question Answer Comment   In the event of cardiac or respiratory ARREST Do not call a "code blue"    In the event of cardiac or respiratory ARREST Do not perform Intubation, CPR, defibrillation or ACLS    In the event of cardiac or respiratory ARREST Use medication by any route, position, wound care, and other measures to relive pain and suffering. May use oxygen, suction and manual treatment of airway obstruction as needed for comfort.      I spoke to pt and his wife in room, Informed about findings and plan. Asked about code status- Wife said- They will discuss once their children are here. Meanwhile- they want to cont Full code.  TOTAL TIME TAKING  CARE OF THIS PATIENT: 50 critical care minutes.    Vaughan Basta M.D on 01/04/2017   Between 7am to 6pm - Pager - [phone]  After 6pm go to [email or web address] - password [email or web address] [facility]  Office  [phone]  CC: Primary care physician; [facility]   Note: This dictation was prepared with Dragon dictation along with smaller phrase technology. Any transcriptional errors that result from this process are unintentional.

## 2017-01-04 NOTE — ED Notes (Signed)
Family at bedside. 

## 2017-01-04 NOTE — ED Notes (Signed)
Pt to the er for SOB possibly r/t his cancer treatment. Pt has felt weak and has not had much appetite for a few days. Last cancer treatment was last Friday. Pt has some cough from the throat but does not feel anything in his lungs. Pt has pursed lip exhales. Pt is pale in color.

## 2017-01-04 NOTE — ED Notes (Signed)
\  [id number]\\[id number]\

## 2017-01-04 NOTE — Consult Note (Signed)
GI Inpatient Consult Note Kathline Magic, M.D.  Reason for Consult: melena, symptomatic anemia   Attending Requesting Consult: Dr. Vaughan Basta  Outpatient Primary Physician: Dd Kym Groom  History of Present Illness: Chad Kirby is a 83 y.o. male with history of prostate cancer for the last 10 years on experimental chemotherapy is presented to the emergency room with about 7 days of intermittent melena and constipation. He has mild diffuse abdominal bloating as well as symptoms of fatigue and shortness of breath. He claims his hemoglobin baseline is around 10 since being on chronic chemotherapy but it has dropped to around 7 over the past week. His wife brought him in due to shortness of breath. Patient denies any hematemesis nausea or vomiting. He takes Motrin intermittently as well as Celebrex 1-2 pills daily up until recently when his oncologist told him to stop all NSAIDs. The patient underwent an EGD and colonoscopy in March 2017 by Dr. showing a nonbleeding duodenal ulcer and a tubulovillous adenomatous colon polyp.  Past Medical History:  Past Medical History:  Diagnosis Date  . Atrial fibrillation ([place])   . Cancer [facility])    Prostate with bone mets-current chemo treatment  . COPD (chronic obstructive pulmonary disease) ([place])   . Gastric ulcer   . Hyperlipidemia     Problem List: Patient Active Problem List   Diagnosis Date Noted  . GI bleed 01/04/2017  . GIB (gastrointestinal bleeding) 05/30/2015  . Anemia 05/30/2015  . Lower GI bleed 05/15/2015  . Cellulitis of left lower extremity   . Atrial fibrillation with RVR ([place]) 05/01/2015  . Sepsis ([place]) 05/01/2015  . Cellulitis and abscess of leg 05/01/2015    Past Surgical History: Past Surgical History:  Procedure Laterality Date  . COLONOSCOPY WITH PROPOFOL N/A 05/18/2015   Procedure: COLONOSCOPY WITH PROPOFOL;  Surgeon: Hulen Luster, MD;  Location: [facility] ENDOSCOPY;  Service: Endoscopy;  Laterality: N/A;  .  ESOPHAGOGASTRODUODENOSCOPY N/A 05/19/2015   Procedure: ESOPHAGOGASTRODUODENOSCOPY (EGD);  Surgeon: Hulen Luster, MD;  Location: [facility] ENDOSCOPY;  Service: Endoscopy;  Laterality: N/A;  . ESOPHAGOGASTRODUODENOSCOPY (EGD) WITH PROPOFOL N/A 06/02/2015   Procedure: ESOPHAGOGASTRODUODENOSCOPY (EGD) WITH PROPOFOL;  Surgeon: Lollie Sails, MD;  Location: [facility] ENDOSCOPY;  Service: Endoscopy;  Laterality: N/A;  patient may require intubation  . REPLACEMENT TOTAL KNEE Right     Allergies: Allergies  Allergen Reactions  . Latex Rash    Home Medications:  (Not in a hospital admission) Home medication reconciliation was completed with the patient.   Scheduled Inpatient Medications:   . [START ON 01/07/2017] pantoprazole  40 mg Intravenous Q12H  . phytonadione  5 mg Oral Once    Continuous Inpatient Infusions:   . sodium chloride    . sodium chloride    . pantoprazole (PROTONIX) IVPB 80 mg (01/04/17 0844)  . pantoprozole (PROTONIX) infusion      PRN Inpatient Medications:    Family History: family history includes Brain cancer in his sister; Stroke in his father.   GI Family History: No signficant family history  Social History:   reports that he has quit smoking. He does not have any smokeless tobacco history on file. He reports that he drinks alcohol. The patient denies ETOH, tobacco, or drug use.   ROS  Review of Systems: Review of Systems - Negative except sob and dizziness. GI ROS in HPI.  Physical Examination: BP 108/61   Pulse 98   Temp (!) 97.5 F (36.4 C) (Oral)   Resp 20   Ht  6' (1.829 m)   Wt 98.4 kg (217 lb)   SpO2 97%   BMI 29.43 kg/m  Gen: Alert, moderately SOB at rest. Cooperative and able to speak. Denies pain. HEENT:[place]/AT  Neck: Supple no nodes Chest: CTA CV: Irreg rate, no gallop Abd: mildly distended, mildly tender in epigastrium without rebound or masses. BS+ Ext: No edema. Skin:Numerous ecchymoses likely related to coumadin therapy Other:Alert, good  judgement. Nonfocal.  Data: Lab Results  Component Value Date   WBC 18.1 (H) 01/04/2017   HGB 7.3 (L) 01/04/2017   HCT 22.2 (L) 01/04/2017   MCV 115.2 (H) 01/04/2017   PLT 100 (L) 01/04/2017    Recent Labs Lab 01/04/17 0552  HGB 7.3*   Lab Results  Component Value Date   NA 134 (L) 01/04/2017   K 4.2 01/04/2017   CL 102 01/04/2017   CO2 23 01/04/2017   BUN 24 (H) 01/04/2017   CREATININE 1.12 01/04/2017   Lab Results  Component Value Date   ALT 18 01/04/2017   AST 29 01/04/2017   ALKPHOS 194 (H) 01/04/2017   BILITOT 0.9 01/04/2017    Recent Labs Lab 01/04/17 0552  INR 2.74   CBC Latest Ref Rng & Units 01/04/2017 08/04/2015 07/04/2015  WBC 3.8 - 10.6 K/uL 18.1(H) 9.2 7.4  Hemoglobin 13.0 - 18.0 g/dL 7.3(L) 11.7(L) 9.2(L)  Hematocrit 40.0 - 52.0 % 22.2(L) 35.7(L) 28.0(L)  Platelets 150 - 440 K/uL 100(L) 176 157    STUDIES: Dg Chest Port 1 View  Result Date: 01/04/2017 CLINICAL DATA:  NT EXAM: PORTABLE CHEST 1 VIEW COMPARISON:  Chest radiograph 05/21/2015 FINDINGS: Cardiomegaly is unchanged. The left hemidiaphragm remains elevated. There is bibasilar atelectasis. No focal airspace consolidation or pulmonary edema. No pneumothorax or sizable pleural effusion. Previously demonstrated sclerotic mid to lower thoracic vertebral bodies are poorly visualized on the current study due to technical factors. IMPRESSION: Cardiomegaly and unchanged elevation of the left hemidiaphragm with bibasilar atelectasis. No acute cardiopulmonary disease. Electronically Signed   By: Ulyses Jarred M.D.   On: 01/04/2017 06:13   @IMAGES @  Assessment: 1. GI bleed/melena - Likely related to PUD given previous history of NSAID use and duodenal ulcer. 2. Coagulopathy due to coumadin - Coumadin used for atrial fibrillation. Held by Dr. Anselm Jungling, Attending physician. Will need INR under 1.8 prior to proceeding with endoluminal evaluation, 3, Anemia secondary to blood loss. 4. Prostate cancer on  chemotherapy - this appears to be causing some anemia as well, but not acutely. 5. Advanced age. 6. History of tubulovillous adenoma of colon-March 2017 7. Personal history duodenal ulcer in March 2017 EGD  Recommendations: 1. May have clear liquids by mouth. 2. Agree with holding Coumadin to allow INR to drift down. 3. IV acid suppression is recommended. 4. EGD when clinically feasible. The patient understands the nature of the planned procedure, indications, risks, alternatives and potential complications including but not limited to bleeding, infection, perforation, damage to internal organs and possible oversedation/side effects from anesthesia. The patient agrees and gives consent to proceed.  Please refer to procedure notes for findings, recommendations and patient disposition/instructions.  Thank you for the consult. Please call with questions or concerns.  Olean Ree, MD "[facility]"  01/04/2017 8:59 AM

## 2017-01-05 LAB — TYPE AND SCREEN
ABO/RH(D): A POS
ANTIBODY SCREEN: NEGATIVE
Unit division: 0
Unit division: 0

## 2017-01-05 LAB — BASIC METABOLIC PANEL
ANION GAP: 5 (ref 5–15)
BUN: 19 mg/dL (ref 6–20)
CHLORIDE: 109 mmol/L (ref 101–111)
CO2: 22 mmol/L (ref 22–32)
Calcium: 7.2 mg/dL — ABNORMAL LOW (ref 8.9–10.3)
Creatinine, Ser: 0.93 mg/dL (ref 0.61–1.24)
GFR calc Af Amer: >60 mL/min (ref >60)
GLUCOSE: 108 mg/dL — AB (ref 65–99)
POTASSIUM: 3.9 mmol/L (ref 3.5–5.1)
Sodium: 136 mmol/L (ref 135–145)

## 2017-01-05 LAB — BPAM RBC
BLOOD PRODUCT EXPIRATION DATE: 201811202359
BLOOD PRODUCT EXPIRATION DATE: 201811202359
ISSUE DATE / TIME: 201811021149
ISSUE DATE / TIME: 201811021509
UNIT TYPE AND RH: 6200
Unit Type and Rh: 6200

## 2017-01-05 LAB — HEMOGLOBIN: Hemoglobin: 8 g/dL — ABNORMAL LOW (ref 13.0–18.0)

## 2017-01-05 LAB — HAPTOGLOBIN: Haptoglobin: 155 mg/dL (ref 34–200)

## 2017-01-05 LAB — CBC
HEMATOCRIT: 23.6 % — AB (ref 40.0–52.0)
HEMOGLOBIN: 7.9 g/dL — AB (ref 13.0–18.0)
MCH: 34.7 pg — AB (ref 26.0–34.0)
MCHC: 33.4 g/dL (ref 32.0–36.0)
MCV: 103.9 fL — AB (ref 80.0–100.0)
Platelets: 83 10*3/uL — ABNORMAL LOW (ref 150–440)
RBC: 2.28 MIL/uL — ABNORMAL LOW (ref 4.40–5.90)
RDW: 26.4 % — ABNORMAL HIGH (ref 11.5–14.5)
WBC: 22 10*3/uL — ABNORMAL HIGH (ref 3.8–10.6)

## 2017-01-05 LAB — PROTIME-INR
INR: 1.8
Prothrombin Time: 20.7 seconds — ABNORMAL HIGH (ref 11.4–15.2)

## 2017-01-05 MED ORDER — GUAIFENESIN-DM 100-10 MG/5ML PO SYRP
5.0000 mL | ORAL_SOLUTION | ORAL | Status: DC | PRN
  Administered 2017-01-05 – 2017-01-07 (×5): 5 mL via ORAL
  Filled 2017-01-05 (×7): qty 5

## 2017-01-05 MED ORDER — METOPROLOL SUCCINATE ER 25 MG PO TB24
25.0000 mg | ORAL_TABLET | Freq: Every day | ORAL | Status: DC
  Administered 2017-01-05 – 2017-01-07 (×3): 25 mg via ORAL
  Filled 2017-01-05 (×3): qty 1

## 2017-01-05 NOTE — Progress Notes (Signed)
HGG holding at 8.0 GMS. EGD being planned for tomorrow. Tolerating clear liquids well. Family at bedside. Metoprolol restarted for at fib. Up to [facility] with 1+ with melana continued. Protonix IV drip continued.

## 2017-01-05 NOTE — Progress Notes (Signed)
Subjective: Since I last evaluated the patient he reports two moderate "red and black stools". Denies abdominal pain. Patient states his energy level is better after 2 u PRBC transfusion.  Objective: Vital signs in last 24 hours: Temp:  [97.5 F (36.4 C)-98.1 F (36.7 C)] 97.5 F (36.4 C) (11/03 0855) Pulse Rate:  [103-130] 116 (11/03 1131) Resp:  [18-20] 20 (11/03 0855) BP: (95-119)/(49-64) 116/55 (11/03 0855) SpO2:  [94 %-97 %] 94 % (11/03 0855) Last BM Date: 01/05/17  Intake/Output from previous day: 11/02 0701 - 11/03 0700 In: 3500.4 [P.O.:760; I.V.:1080.4; Blood:660; IV Piggyback:1000] Out: 400 [Urine:400] Intake/Output this shift: Total I/O In: 240 [P.O.:240] Out: 250 [Urine:250]  General appearance: alert, cooperative and appears stated age Resp: clear to auscultation bilaterally Cardio: irregularly irregular rhythm GI: soft, non-tender; bowel sounds normal; no masses,  no organomegaly  Lab Results:  Recent Labs  01/04/17 0552  01/04/17 1815 01/05/17 0042 01/05/17 0851  WBC 18.1*  --   --  22.0*  --   HGB 7.3*  < > 8.4* 7.9* 8.0*  HCT 22.2*  --   --  23.6*  --   PLT 100*  --   --  83*  --   < > = values in this interval not displayed. BMET  Recent Labs  01/04/17 0552 01/05/17 0042  NA 134* 136  K 4.2 3.9  CL 102 109  CO2 23 22  GLUCOSE 184* 108*  BUN 24* 19  CREATININE 1.12 0.93  CALCIUM 7.6* 7.2*   LFT  Recent Labs  01/04/17 0552  PROT 5.0*  ALBUMIN 2.5*  AST 29  ALT 18  ALKPHOS 194*  BILITOT 0.9  BILIDIR 0.1  IBILI 0.8   PT/INR  Recent Labs  01/04/17 0552 01/05/17 0042  LABPROT 28.8* 20.7*  INR 2.74 1.80   Hepatitis Panel No results for input(s): HEPBSAG, HCVAB, HEPAIGM, HEPBIGM in the last 72 hours. C-Diff No results for input(s): CDIFFTOX in the last 72 hours. No results for input(s): CDIFFPCR in the last 72 hours. Fecal Lactopherrin No results for input(s): FECLLACTOFRN in the last 72 hours.  Studies/Results: Dg  Chest Port 1 View  Result Date: 01/04/2017 CLINICAL DATA:  NT EXAM: PORTABLE CHEST 1 VIEW COMPARISON:  Chest radiograph 05/21/2015 FINDINGS: Cardiomegaly is unchanged. The left hemidiaphragm remains elevated. There is bibasilar atelectasis. No focal airspace consolidation or pulmonary edema. No pneumothorax or sizable pleural effusion. Previously demonstrated sclerotic mid to lower thoracic vertebral bodies are poorly visualized on the current study due to technical factors. IMPRESSION: Cardiomegaly and unchanged elevation of the left hemidiaphragm with bibasilar atelectasis. No acute cardiopulmonary disease. Electronically Signed   By: Ulyses Jarred M.D.   On: 01/04/2017 06:13    Medications: I have reviewed the patient's current medications.  Assessment/Plan: 1. Melena - likely from PUD. Patient has hx of Duodenal ulcer in March 2017.  2. Anemia secondary to GI blood loss  - Hgb 8.0 today (down from 8.4 after 2u transfusion). Blood pressure stable.  3. Hx PUD - continue IV Protonix, serial H/H. Transfuse as needed.   Plan EGD in AM. The patient understands the nature of the planned procedure, indications, risks, alternatives and potential complications including but not limited to bleeding, infection, perforation, damage to internal organs and possible oversedation/side effects from anesthesia. The patient agrees and gives consent to proceed.  Please refer to procedure notes for findings, recommendations and patient disposition/instructions.  LOS: 1 day   [place], Lorie Apley 01/05/2017, 11:37 AM

## 2017-01-06 ENCOUNTER — Encounter: Payer: Self-pay | Admitting: Anesthesiology

## 2017-01-06 ENCOUNTER — Inpatient Hospital Stay: Payer: Medicare Other | Admitting: Anesthesiology

## 2017-01-06 ENCOUNTER — Other Ambulatory Visit: Payer: Self-pay

## 2017-01-06 ENCOUNTER — Encounter
Admission: EM | Disposition: A | Discharge status: Home / Self Care | Payer: Self-pay | Source: Home / Self Care | Attending: Internal Medicine

## 2017-01-06 HISTORY — PX: ESOPHAGOGASTRODUODENOSCOPY (EGD) WITH PROPOFOL: SHX5813

## 2017-01-06 LAB — CBC
HEMATOCRIT: 24.9 % — AB (ref 40.0–52.0)
Hemoglobin: 8 g/dL — ABNORMAL LOW (ref 13.0–18.0)
MCH: 34 pg (ref 26.0–34.0)
MCHC: 31.9 g/dL — AB (ref 32.0–36.0)
MCV: 106.5 fL — ABNORMAL HIGH (ref 80.0–100.0)
Platelets: 78 10*3/uL — ABNORMAL LOW (ref 150–440)
RBC: 2.34 MIL/uL — AB (ref 4.40–5.90)
RDW: 26.8 % — ABNORMAL HIGH (ref 11.5–14.5)
WBC: 14.6 10*3/uL — ABNORMAL HIGH (ref 3.8–10.6)

## 2017-01-06 LAB — HEMOGLOBIN AND HEMATOCRIT, BLOOD
HEMATOCRIT: 25.8 % — AB (ref 40.0–52.0)
HEMOGLOBIN: 8.3 g/dL — AB (ref 13.0–18.0)

## 2017-01-06 LAB — PROTIME-INR
INR: 1.46
Prothrombin Time: 17.6 seconds — ABNORMAL HIGH (ref 11.4–15.2)

## 2017-01-06 SURGERY — ESOPHAGOGASTRODUODENOSCOPY (EGD) WITH PROPOFOL
Anesthesia: Monitor Anesthesia Care

## 2017-01-06 MED ORDER — PHENYLEPHRINE HCL 10 MG/ML IJ SOLN
INTRAMUSCULAR | Status: DC | PRN
  Administered 2017-01-06 (×2): 300 ug via INTRAVENOUS
  Administered 2017-01-06: 200 ug via INTRAVENOUS
  Administered 2017-01-06: 300 ug via INTRAVENOUS
  Administered 2017-01-06: 200 ug via INTRAVENOUS

## 2017-01-06 MED ORDER — LIDOCAINE HCL (CARDIAC) 20 MG/ML IV SOLN
INTRAVENOUS | Status: DC | PRN
  Administered 2017-01-06: 100 mg via INTRAVENOUS

## 2017-01-06 MED ORDER — LIDOCAINE HCL (PF) 2 % IJ SOLN
INTRAMUSCULAR | Status: AC
  Filled 2017-01-06: qty 10

## 2017-01-06 MED ORDER — PROPOFOL 500 MG/50ML IV EMUL
INTRAVENOUS | Status: AC
  Filled 2017-01-06: qty 50

## 2017-01-06 MED ORDER — ONDANSETRON HCL 4 MG/2ML IJ SOLN: 4.0000 mg | Freq: Once | INTRAMUSCULAR | Status: DC | PRN

## 2017-01-06 MED ORDER — SODIUM CHLORIDE 0.9 % IV SOLN: INTRAVENOUS | Status: DC

## 2017-01-06 MED ORDER — SODIUM CHLORIDE 0.9 % IV SOLN
INTRAVENOUS | Status: DC | PRN
  Administered 2017-01-06: 10:00:00 via INTRAVENOUS

## 2017-01-06 MED ORDER — FENTANYL CITRATE (PF) 100 MCG/2ML IJ SOLN: 25.0000 ug | INTRAMUSCULAR | Status: DC | PRN

## 2017-01-06 MED ORDER — PROPOFOL 10 MG/ML IV BOLUS
INTRAVENOUS | Status: DC | PRN
  Administered 2017-01-06 (×3): 30 mg via INTRAVENOUS
  Administered 2017-01-06: 60 mg via INTRAVENOUS
  Administered 2017-01-06 (×3): 30 mg via INTRAVENOUS

## 2017-01-06 NOTE — Progress Notes (Signed)
Staffed with Dr. Alice Reichert with no new changes. Continue same plan of care.

## 2017-01-06 NOTE — Progress Notes (Signed)
[place] [facility] [place] NAME: Chad Kirby    MR#:  [id number]  DATE OF BIRTH:  1929/11/28  SUBJECTIVE:  CHIEF COMPLAINT:   Chief Complaint  Patient presents with  . Weakness     Came with Weakness, have anemia, GI bleed. Received Blood transfusion, Hb came up. Had some dark and with blood BM. No pain.  REVIEW OF SYSTEMS:  CONSTITUTIONAL: No fever,positive for fatigue or weakness.  EYES: No blurred or double vision.  EARS, NOSE, AND THROAT: No tinnitus or ear pain.  RESPIRATORY: No cough, shortness of breath, wheezing or hemoptysis.  CARDIOVASCULAR: No chest pain, orthopnea, edema.  GASTROINTESTINAL: No nausea, vomiting, diarrhea or abdominal pain. Dark BM. GENITOURINARY: No dysuria, hematuria.  ENDOCRINE: No polyuria, nocturia,  HEMATOLOGY: No anemia, easy bruising or bleeding SKIN: No rash or lesion. MUSCULOSKELETAL: No joint pain or arthritis.   NEUROLOGIC: No tingling, numbness, weakness.  PSYCHIATRY: No anxiety or depression.   ROS  DRUG ALLERGIES:   Allergies  Allergen Reactions  . Latex Rash    VITALS:  Blood pressure (!) 114/50, pulse (!) 113, temperature 98 F (36.7 C), temperature source Oral, resp. rate (!) 24, height 6\' 1"  (1.854 m), weight 98.2 kg (216 lb 8 oz), SpO2 93 %.  PHYSICAL EXAMINATION:   GENERAL:  83 y.o.-year-old patient lying in the bed with no acute distress.  EYES: Pupils equal, round, reactive to light and accommodation. No scleral icterus. Extraocular muscles intact.  HEENT: Head atraumatic, normocephalic. Oropharynx and nasopharynx clear.  NECK:  Supple, no jugular venous distention. No thyroid enlargement, no tenderness.  LUNGS: Normal breath sounds bilaterally, no wheezing, rales,rhonchi or crepitation. No use of accessory muscles of respiration. Fast breathing. CARDIOVASCULAR: S1, S2 fast and regular. No murmurs, rubs, or gallops.  ABDOMEN: Soft, nontender, nondistended. Bowel sounds present.  No organomegaly or mass.  EXTREMITIES: No pedal edema, cyanosis, or clubbing.  NEUROLOGIC: Cranial nerves II through XII are intact. Muscle strength 5/5 in all extremities. Sensation intact. Gait not checked.  PSYCHIATRIC: The patient is alert and oriented x 3.  SKIN: No obvious rash, lesion, or ulcer.  Physical Exam LABORATORY PANEL:   CBC  Recent Labs Lab 01/05/17 0042 01/05/17 0851  WBC 22.0*  --   HGB 7.9* 8.0*  HCT 23.6*  --   PLT 83*  --    ------------------------------------------------------------------------------------------------------------------  Chemistries   Recent Labs Lab 01/04/17 0552 01/05/17 0042  NA 134* 136  K 4.2 3.9  CL 102 109  CO2 23 22  GLUCOSE 184* 108*  BUN 24* 19  CREATININE 1.12 0.93  CALCIUM 7.6* 7.2*  AST 29  --   ALT 18  --   ALKPHOS 194*  --   BILITOT 0.9  --    ------------------------------------------------------------------------------------------------------------------  Cardiac Enzymes  Recent Labs Lab 01/04/17 0552  TROPONINI <0.03   ------------------------------------------------------------------------------------------------------------------  RADIOLOGY:  Dg Chest Port 1 View  Result Date: 01/04/2017 CLINICAL DATA:  NT EXAM: PORTABLE CHEST 1 VIEW COMPARISON:  Chest radiograph 05/21/2015 FINDINGS: Cardiomegaly is unchanged. The left hemidiaphragm remains elevated. There is bibasilar atelectasis. No focal airspace consolidation or pulmonary edema. No pneumothorax or sizable pleural effusion. Previously demonstrated sclerotic mid to lower thoracic vertebral bodies are poorly visualized on the current study due to technical factors. IMPRESSION: Cardiomegaly and unchanged elevation of the left hemidiaphragm with bibasilar atelectasis. No acute cardiopulmonary disease. Electronically Signed   By: Ulyses Jarred M.D.   On: 01/04/2017 06:13    ASSESSMENT AND  PLAN:   Principal Problem:   Anemia Active Problems:   GIB  (gastrointestinal bleeding)   GI bleed  * Symptomatic anemia   Acute GI bleed   Acquired coagulopathy due to COumadin.    2 Unit PRBC ordered by ER.   Hold Warfarin.   Vitamin K oral.   Monitor Hb Q8 hrly   IV fluids, IV protonix drip.   Liquid diet.  EGD likely tomorrow, once INR comes down some.    * A fib   Cont digoxin   Held cardizem and metoprolol as pt is hypotensive and tachy   Monitor on cardiac monitor.   Cardio consult.may need to stop coumadin now , as this is second bleeding episode   Resume metoprolol again due to tachycardia.  * Prostate cancer   Cont to follow at [facility].  * Hyperlipidemia   Cont Statin.     All the records are reviewed and case discussed with Care Management/Social Workerr. Management plans discussed with the patient, family and they are in agreement.  CODE STATUS: FUll.  TOTAL TIME TAKING CARE OF THIS PATIENT: 35 minutes.    POSSIBLE D/C IN 1-2 DAYS, DEPENDING ON CLINICAL CONDITION.   Vaughan Basta M.D on 01/06/2017   Between 7am to 6pm - Pager - [phone]  After 6pm go to [email or web address] - password [email or web address] [facility]  Office  [phone]  CC: Primary care physician; [facility]  Note: This dictation was prepared with Dragon dictation along with smaller phrase technology. Any transcriptional errors that result from this process are unintentional.

## 2017-01-06 NOTE — Anesthesia Postprocedure Evaluation (Signed)
Anesthesia Post Note  Patient: Chad Kirby  Procedure(s) Performed: ESOPHAGOGASTRODUODENOSCOPY (EGD) WITH PROPOFOL (N/A )  Patient location during evaluation: PACU Anesthesia Type: General Level of consciousness: awake and alert and oriented Pain management: pain level controlled Vital Signs Assessment: post-procedure vital signs reviewed and stable Respiratory status: spontaneous breathing Cardiovascular status: blood pressure returned to baseline Anesthetic complications: no     Last Vitals:  Vitals:   01/06/17 1100 01/06/17 1134  BP: 115/71 (!) 111/59  Pulse: (!) 103 (!) 102  Resp: (!) 23 18  Temp:  36.4 C  SpO2: 99% 96%    Last Pain:  Vitals:   01/06/17 1134  TempSrc: Oral  PainSc:                  [name]

## 2017-01-06 NOTE — Anesthesia Post-op Follow-up Note (Signed)
Anesthesia QCDR form completed.        

## 2017-01-06 NOTE — Progress Notes (Signed)
15 minute call to floor. 

## 2017-01-06 NOTE — Progress Notes (Signed)
HGB 8.3 with Dr. Alice Reichert paged with update on stools, BP and HGB currently. Will continue to monitor pt closely.

## 2017-01-06 NOTE — Anesthesia Preprocedure Evaluation (Addendum)
Anesthesia Evaluation  Patient identified by MRN, date of birth, ID band Patient awake    Reviewed: Allergy & Precautions, NPO status , Patient's Chart, lab work & pertinent test results, reviewed documented beta blocker date and time   Airway Mallampati: III  TM Distance: >3 FB Neck ROM: Full    Dental  (+) Chipped, Caps   Pulmonary COPD,  COPD inhaler, former smoker,    Pulmonary exam normal breath sounds clear to auscultation       Cardiovascular Normal cardiovascular exam+ dysrhythmias Atrial Fibrillation      Neuro/Psych negative neurological ROS  negative psych ROS   GI/Hepatic negative GI ROS, Neg liver ROS, PUD,   Endo/Other  negative endocrine ROS  Renal/GU negative Renal ROS     Musculoskeletal Bone mets   Abdominal Normal abdominal exam  (+)   Peds negative pediatric ROS (+)  Hematology  (+) anemia ,   Anesthesia Other Findings Cellulitis of leg  Reproductive/Obstetrics                             Anesthesia Physical  Anesthesia Plan  ASA: III and emergent  Anesthesia Plan: General and MAC   Post-op Pain Management:    Induction: Intravenous  PONV Risk Score and Plan: 1  Airway Management Planned: Nasal Cannula  Additional Equipment:   Intra-op Plan:   Post-operative Plan:   Informed Consent: I have reviewed the patients History and Physical, chart, labs and discussed the procedure including the risks, benefits and alternatives for the proposed anesthesia with the patient or authorized representative who has indicated his/her understanding and acceptance.   Dental advisory given  Plan Discussed with: CRNA and Surgeon  Anesthesia Plan Comments:        Anesthesia Quick Evaluation

## 2017-01-06 NOTE — Transfer of Care (Signed)
Immediate Anesthesia Transfer of Care Note  Patient: Chad Kirby  Procedure(s) Performed: ESOPHAGOGASTRODUODENOSCOPY (EGD) WITH PROPOFOL (N/A )  Patient Location: PACU  Anesthesia Type:General  Level of Consciousness: sedated  Airway & Oxygen Therapy: Patient Spontanous Breathing and Patient connected to nasal cannula oxygen  Post-op Assessment: Report given to RN and Post -op Vital signs reviewed and stable  Post vital signs: Reviewed and stable  Last Vitals:  Vitals:   01/06/17 0730 01/06/17 0916  BP: (!) 106/52 (!) 102/51  Pulse: 98 98  Resp: 20 20  Temp: 36.7 C 36.9 C  SpO2: 94% 97%    Last Pain:  Vitals:   01/06/17 0916  TempSrc: Tympanic  PainSc:          Complications: No apparent anesthesia complications

## 2017-01-06 NOTE — Progress Notes (Signed)
Patient into pacu with runny, liquid, maroon , strong Smelling stool.  Cleaned up patient.

## 2017-01-06 NOTE — OR Nursing (Signed)
Dr. Alice Reichert made aware in pacu.  Verbalizes they could Smell it during his endo procedure.

## 2017-01-06 NOTE — Progress Notes (Addendum)
[place] [place] NAME: Jabril Pursell    MR#:  [id number]  DATE OF BIRTH:  1930-01-24  SUBJECTIVE:  CHIEF COMPLAINT:   Chief Complaint  Patient presents with  . Weakness     Came with Weakness, have anemia, GI bleed. Received I unit Blood transfusion yesterday hemoglobin is at 8 today but denies any abdominal pain. RN is reporting the patient is having maroon-colored stool.   REVIEW OF SYSTEMS:  CONSTITUTIONAL: No fever,positive for fatigue or weakness. Reporting headache EYES: No blurred or double vision.  EARS, NOSE, AND THROAT: No tinnitus or ear pain.  RESPIRATORY: No cough, shortness of breath, wheezing or hemoptysis.  CARDIOVASCULAR: No chest pain, orthopnea, edema.  GASTROINTESTINAL: No nausea, vomiting, diarrhea or abdominal pain. Dark BM. GENITOURINARY: No dysuria, hematuria.  ENDOCRINE: No polyuria, nocturia,  HEMATOLOGY: No anemia, easy bruising or bleeding SKIN: No rash or lesion. MUSCULOSKELETAL: No joint pain or arthritis.   NEUROLOGIC: No tingling, numbness, weakness.  PSYCHIATRY: No anxiety or depression.   ROS  DRUG ALLERGIES:   Allergies  Allergen Reactions  . Latex Rash    VITALS:  Blood pressure (!) 106/52, pulse 98, temperature 98.1 F (36.7 C), temperature source Oral, resp. rate 20, height 6\' 1"  (1.854 m), weight 98.2 kg (216 lb 8 oz), SpO2 94 %.  PHYSICAL EXAMINATION:   GENERAL:  83 y.o.-year-old patient lying in the bed with no acute distress.  EYES: Pupils equal, round, reactive to light and accommodation. No scleral icterus. Extraocular muscles intact.  HEENT: Head atraumatic, normocephalic. Oropharynx and nasopharynx clear.  NECK:  Supple, no jugular venous distention. No thyroid enlargement, no tenderness.  LUNGS: Normal breath sounds bilaterally, no wheezing, rales,rhonchi or crepitation. No use of accessory muscles of respiration. Fast breathing. CARDIOVASCULAR: S1, S2 fast and regular. No  murmurs, rubs, or gallops.  ABDOMEN: Soft, nontender, nondistended. Bowel sounds present. No organomegaly or mass.  EXTREMITIES: No pedal edema, cyanosis, or clubbing.  NEUROLOGIC: Cranial nerves II through XII are intact. Muscle strength 5/5 in all extremities. Sensation intact. Gait not checked.  PSYCHIATRIC: The patient is alert and oriented x 3.  SKIN: No obvious rash, lesion, or ulcer.  Physical Exam LABORATORY PANEL:   CBC Recent Labs  Lab 01/06/17 0403  WBC 14.6*  HGB 8.0*  HCT 24.9*  PLT 78*   ------------------------------------------------------------------------------------------------------------------  Chemistries  Recent Labs  Lab 01/04/17 0552 01/05/17 0042  NA 134* 136  K 4.2 3.9  CL 102 109  CO2 23 22  GLUCOSE 184* 108*  BUN 24* 19  CREATININE 1.12 0.93  CALCIUM 7.6* 7.2*  AST 29  --   ALT 18  --   ALKPHOS 194*  --   BILITOT 0.9  --    ------------------------------------------------------------------------------------------------------------------  Cardiac Enzymes Recent Labs  Lab 01/04/17 0552  TROPONINI <0.03   ------------------------------------------------------------------------------------------------------------------  RADIOLOGY:  No results found.  ASSESSMENT AND PLAN:   Principal Problem:   Anemia Active Problems:   GIB (gastrointestinal bleeding)   GI bleed  * Symptomatic anemia secondary to Coumadin coagulopathy with active GI bleed    patient has received 2 units of blood transfusion in the emergency department  Today's hemoglobin is at 8    Hold Warfarin.   Vitamin K oral.   Monitor Hb and hematocrit and transfuse as needed  Discussed with gastroenterology Dr. Alice Reichert, scheduled for endoscopy today  Continue   IV fluids, IV protonix drip, nothing by mouth      *  A fib- rate controlled at this time   Cont digoxin   Held cardizem and metoprolol as pt is hypotensive and tachy   Monitor on cardiac monitor.   Cardio  consult.patient is seen by: Nehemiah Massed okay to hold off Coumadin for now   Resume metoprolol again due to tachycardia.  * Prostate cancer   Cont to follow at [facility].  * Hyperlipidemia   Cont Statin.    All the records are reviewed and case discussed with Care Management/Social Workerr. Management plans discussed with the patient, daughter-in-law at bedside , they are in agreement.  CODE STATUS: FUll.  TOTAL TIME TAKING CARE OF THIS PATIENT: 35 minutes.    POSSIBLE D/C IN 1-2 DAYS, DEPENDING ON CLINICAL CONDITION.   [name] M.D on 01/06/2017   Between 7am to 6pm - Pager - [phone]  After 6pm go to [email or web address] - password [email or web address] [facility]  Office  [phone]  CC: Primary care physician; [facility]  Note: This dictation was prepared with Dragon dictation along with smaller phrase technology. Any transcriptional errors that result from this process are unintentional.

## 2017-01-06 NOTE — Progress Notes (Signed)
Pt has had 2nd marron stool since EGD; VS's recheck done with BP lowering; HR stable; color pale; denies pain. HGB pending. Dr. Margaretmary Eddy updated and agrees to notify GI when HGB back. Family at bedside.

## 2017-01-06 NOTE — Op Note (Signed)
[facility] Gastroenterology Patient Name: Chad Kirby Procedure Date: 01/06/2017 9:19 AM MRN: [id number] Account #: [id number] Date of Birth: Jan 03, 1930 Admit Type: Inpatient Age: 83 Room: [facility] ENDO ROOM 4 Gender: Male Note Status: Finalized Procedure:            Upper GI endoscopy Indications:          Suspected upper gastrointestinal bleeding, Suspected                        upper gastrointestinal bleeding in patient with                        unexplained iron deficiency anemia Providers:            Benay Pike. Alice Reichert MD, MD Referring MD:         Wynona Canes. Kym Groom, MD (Referring MD) Medicines:            Propofol per Anesthesia Complications:        No immediate complications. Procedure:            Pre-Anesthesia Assessment:                       - The risks and benefits of the procedure and the                        sedation options and risks were discussed with the                        patient. All questions were answered and informed                        consent was obtained.                       - Patient identification and proposed procedure were                        verified prior to the procedure by the nurse. The                        procedure was verified in the procedure room.                       - ASA Grade Assessment: III - A patient with severe                        systemic disease.                       - After reviewing the risks and benefits, the patient                        was deemed in satisfactory condition to undergo the                        procedure.                       After obtaining informed consent, the endoscope was  passed under direct vision. Throughout the procedure,                        the patient's blood pressure, pulse, and oxygen                        saturations were monitored continuously. The Endoscope                        was introduced through the mouth, and advanced to  the                        third part of duodenum. The upper GI endoscopy was                        accomplished without difficulty. The patient tolerated                        the procedure well. Findings:      The examined esophagus was normal.      The entire examined stomach was normal.      One non-bleeding cratered duodenal ulcer with a visible vessel was found       in the duodenal bulb. The lesion was five mm by eight mm in largest       dimension. Area was successfully injected with 2 mL of a 1:10,000       solution of epinephrine for hemostasis. Coagulation for hemostasis using       bipolar probe was successful. Estimated blood loss was minimal.      The exam was otherwise without abnormality. Impression:           - Normal esophagus.                       - Normal stomach.                       - One non-bleeding duodenal ulcer with a visible                        vessel. Injected. Treated with bipolar cautery.                       - The examination was otherwise normal.                       - No specimens collected. Recommendation:       - Return patient to hospital ward for observation.                       - Full liquid diet today.                       - Continue present medications.                       - Resume anticoagulant medication at prior dose                        tomorrow.                       - Perform an  H. pylori serology today. Procedure Code(s):    --- Professional ---                       [id number], Esophagogastroduodenoscopy, flexible, transoral;                        with control of bleeding, any method Diagnosis Code(s):    --- Professional ---                       D50.9, Iron deficiency anemia, unspecified                       K26.4, Chronic or unspecified duodenal ulcer with                        hemorrhage CPT copyright 2016 [employer]. All rights reserved. The codes documented in this report are preliminary and upon  coder review may  be revised to meet current compliance requirements. Efrain Sella MD, MD 01/06/2017 10:30:33 AM This report has been signed electronically. Number of Addenda: 0 Note Initiated On: 01/06/2017 9:19 AM      [facility]

## 2017-01-07 ENCOUNTER — Encounter: Payer: Self-pay | Admitting: Internal Medicine

## 2017-01-07 LAB — BASIC METABOLIC PANEL
Anion gap: 5 (ref 5–15)
BUN: 12 mg/dL (ref 6–20)
CALCIUM: 6.6 mg/dL — AB (ref 8.9–10.3)
CO2: 19 mmol/L — ABNORMAL LOW (ref 22–32)
CREATININE: 0.85 mg/dL (ref 0.61–1.24)
Chloride: 113 mmol/L — ABNORMAL HIGH (ref 101–111)
GFR calc non Af Amer: >60 mL/min (ref >60)
Glucose, Bld: 103 mg/dL — ABNORMAL HIGH (ref 65–99)
Potassium: 3.7 mmol/L (ref 3.5–5.1)
SODIUM: 137 mmol/L (ref 135–145)

## 2017-01-07 LAB — CBC
HEMATOCRIT: 24.6 % — AB (ref 40.0–52.0)
HEMOGLOBIN: 8 g/dL — AB (ref 13.0–18.0)
MCH: 34.8 pg — AB (ref 26.0–34.0)
MCHC: 32.6 g/dL (ref 32.0–36.0)
MCV: 106.8 fL — AB (ref 80.0–100.0)
Platelets: 76 10*3/uL — ABNORMAL LOW (ref 150–440)
RBC: 2.3 MIL/uL — ABNORMAL LOW (ref 4.40–5.90)
RDW: 26.5 % — AB (ref 11.5–14.5)
WBC: 10.9 10*3/uL — ABNORMAL HIGH (ref 3.8–10.6)

## 2017-01-07 LAB — HEMOGLOBIN AND HEMATOCRIT, BLOOD
HEMATOCRIT: 23.2 % — AB (ref 40.0–52.0)
HEMOGLOBIN: 7.7 g/dL — AB (ref 13.0–18.0)

## 2017-01-07 MED ORDER — WARFARIN - PHYSICIAN DOSING INPATIENT: Freq: Every day | Status: DC

## 2017-01-07 MED ORDER — WARFARIN SODIUM 5 MG PO TABS
5.0000 mg | ORAL_TABLET | Freq: Once | ORAL | Status: AC
  Administered 2017-01-07: 5 mg via ORAL
  Filled 2017-01-07: qty 1

## 2017-01-07 MED ORDER — PANTOPRAZOLE SODIUM 40 MG PO TBEC: 40.0000 mg | DELAYED_RELEASE_TABLET | Freq: Two times a day (BID) | ORAL | 0 refills | Status: AC

## 2017-01-07 MED ORDER — DOCUSATE SODIUM 100 MG PO CAPS: 100.0000 mg | ORAL_CAPSULE | Freq: Two times a day (BID) | ORAL | 0 refills | Status: AC | PRN

## 2017-01-07 MED ORDER — FERROUS SULFATE 325 (65 FE) MG PO TBEC: 325.0000 mg | DELAYED_RELEASE_TABLET | Freq: Two times a day (BID) | ORAL | 3 refills | Status: AC

## 2017-01-07 MED ORDER — WARFARIN SODIUM 3 MG PO TABS: 3.0000 mg | ORAL_TABLET | Freq: Every day | ORAL | 0 refills | Status: AC

## 2017-01-07 MED ORDER — PANTOPRAZOLE SODIUM 40 MG PO TBEC
40.0000 mg | DELAYED_RELEASE_TABLET | Freq: Two times a day (BID) | ORAL | Status: DC
  Administered 2017-01-07: 15:00:00 40 mg via ORAL
  Filled 2017-01-07: qty 1

## 2017-01-07 NOTE — Discharge Summary (Signed)
[facility] NAME: Mariusz Jubb    MR#:  [id number]  DATE OF BIRTH:  Nov 29, 1929  DATE OF ADMISSION:  01/04/2017 ADMITTING PHYSICIAN: Vaughan Basta, MD  DATE OF DISCHARGE: 01/07/17  PRIMARY CARE PHYSICIAN: [place], [facility]    ADMISSION DIAGNOSIS:  Symptomatic anemia [D64.9] Gastrointestinal hemorrhage, unspecified gastrointestinal hemorrhage type [K92.2]  DISCHARGE DIAGNOSIS:  Principal Problem:   Anemia Active Problems:   GIB (gastrointestinal bleeding)   GI bleed   SECONDARY DIAGNOSIS:   Past Medical History:  Diagnosis Date  . Atrial fibrillation ([place])   . Cancer [facility])    Prostate with bone mets-current chemo treatment  . COPD (chronic obstructive pulmonary disease) ([place])   . Gastric ulcer   . Hyperlipidemia     HOSPITAL COURSE:   HISTORY OF PRESENT ILLNESS: Oday Ridings  is a 83 y.o. male with a known history of A fib on coumadin, Prostate cancer on chemo, COPD, peptic ulcer disease in past, Hyperlipidemia- had a dark stool yesterday. Feeling Very SOB with exertion and tired, so came to ER> Noted a drop in Hb from > 9 to 7.3 in last 5 days. Tachycardiac and hypotensive in ER> GUiac positive per ER physician. They ordered 2 units PRBC transfusion, gave 1 ltr NS bolus and Called for admission. Pt denies any abd pain, any frank blood in stool, any vomiting or nausea  * Symptomatic anemia secondary to Coumadin coagulopathy with active GI bleed  patient has received 2 units of blood transfusion in the emergency department  Today's hemoglobin is at 8 , repeat hemoglobin 7.7.  Stable Okay to resume Coumadin from GI standpoint Rating liquid diet will advance to soft diet for 1 week followed by regular diet Discharge with p.o. Protonix twice a day and follow-up with gastroenterology as outpatient Iron supplements EGD has revealed bleeding gastric ulcer which was injected with epinephrine  *  A fib- rate controlled at this time Cont digoxin Held cardizem , Lasix as pt is hypotensive  Monitored on cardiac monitor. Cardio consult.patient is seen by: Nehemiah Massed , resume Coumadin repeat PT/INR at primary care physician's office on 01/09/2017   Resume metoprolol again due to tachycardia.  * Prostate cancer Cont to follow at [facility].  * Hyperlipidemia Cont Statin.    DISCHARGE CONDITIONS:   stable  CONSULTS OBTAINED:  Treatment Team:  Corey Skains, MD   PROCEDURES egd  DRUG ALLERGIES:   Allergies  Allergen Reactions  . Latex Rash    DISCHARGE MEDICATIONS:   Current Discharge Medication List    START taking these medications   Details  docusate sodium (COLACE) 100 MG capsule Take 1 capsule (100 mg total) 2 (two) times daily as needed by mouth for mild constipation. Qty: 10 capsule, Refills: 0    ferrous sulfate 325 (65 FE) MG EC tablet Take 1 tablet (325 mg total) 2 (two) times daily by mouth. Qty: 60 tablet, Refills: 3      CONTINUE these medications which have CHANGED   Details  pantoprazole (PROTONIX) 40 MG tablet Take 1 tablet (40 mg total) 2 (two) times daily by mouth. Qty: 60 tablet, Refills: 0    warfarin (COUMADIN) 3 MG tablet Take 1 tablet (3 mg total) daily by mouth. Qty: 2 tablet, Refills: 0      CONTINUE these medications which have NOT CHANGED   Details  atorvastatin (LIPITOR) 80 MG tablet Take 80 mg by mouth at bedtime.  budesonide-formoterol (SYMBICORT) 80-4.5 MCG/ACT inhaler Inhale 2 puffs into the lungs 2 (two) times daily.    digoxin (LANOXIN) 0.25 MG tablet Take 1 tablet (0.25 mg total) by mouth daily. Qty: 30 tablet, Refills: 0    LORazepam (ATIVAN) 0.5 MG tablet Take 0.25 mg by mouth every 4 (four) hours as needed for anxiety.    metoprolol succinate (TOPROL-XL) 25 MG 24 hr tablet Take 25 mg by mouth daily.    mirtazapine (REMERON) 15 MG tablet Take 15 mg by mouth at bedtime.    polyethylene  glycol (MIRALAX / GLYCOLAX) packet Take 17 g by mouth 2 (two) times daily. Qty: 14 each, Refills: 0    prochlorperazine (COMPAZINE) 10 MG tablet Take 10 mg by mouth every 6 (six) hours as needed. For nausea and vomiting.    tiotropium (SPIRIVA) 18 MCG inhalation capsule Place 18 mcg into inhaler and inhale daily.    traZODone (DESYREL) 50 MG tablet Take 50 mg by mouth at bedtime as needed for sleep.    collagenase (SANTYL) ointment Apply 1 application topically 2 (two) times daily. Apply a thick layer to posterior left lower leg, cover with NS damp to dry gauze, wrap with kerlix    feeding supplement, ENSURE ENLIVE, (ENSURE ENLIVE) LIQD Take 237 mLs by mouth 3 (three) times daily with meals. Qty: 237 mL, Refills: 12    gabapentin (NEURONTIN) 300 MG capsule Take 1 capsule (300 mg total) by mouth 3 (three) times daily. Qty: 60 capsule, Refills: 0      STOP taking these medications     celecoxib (CELEBREX) 200 MG capsule      diltiazem (CARDIZEM CD) 180 MG 24 hr capsule      furosemide (LASIX) 20 MG tablet      senna-docusate (SENOKOT-S) 8.6-50 MG tablet      diltiazem (CARDIZEM) 60 MG tablet          DISCHARGE INSTRUCTIONS:   Follow-up with primary care physician in 1 week Follow-up with gastroenterology in 2 weeks Soft diet for 1 week followed by cardiac diet Repeat PT/INR at cardiology or primary care physician's office in 2 days on 01/09/2017 and further Coumadin management by cardiology or primary care physician Follow-up with cardiology Dr. Nehemiah Massed in 2 weeks   DIET:  Soft diet for 1 week followed by cardiac diet  DISCHARGE CONDITION:  Stable  ACTIVITY:  Activity as tolerated  OXYGEN:  Home Oxygen: No.   Oxygen Delivery: room air  DISCHARGE LOCATION:  home   If you experience worsening of your admission symptoms, develop shortness of breath, life threatening emergency, suicidal or homicidal thoughts you must seek medical attention immediately by calling  911 or calling your MD immediately  if symptoms less severe.  You Must read complete instructions/literature along with all the possible adverse reactions/side effects for all the Medicines you take and that have been prescribed to you. Take any new Medicines after you have completely understood and accpet all the possible adverse reactions/side effects.   Please note  You were cared for by a hospitalist during your hospital stay. If you have any questions about your discharge medications or the care you received while you were in the hospital after you are discharged, you can call the unit and asked to speak with the hospitalist on call if the hospitalist that took care of you is not available. Once you are discharged, your primary care physician will handle any further medical issues. Please note that NO REFILLS for any discharge medications  will be authorized once you are discharged, as it is imperative that you return to your primary care physician (or establish a relationship with a primary care physician if you do not have one) for your aftercare needs so that they can reassess your need for medications and monitor your lab values.     Today  Chief Complaint  Patient presents with  . Weakness   Patient is doing fine.  Denies any abdominal pain.  No other episodes of bleeding.  Wants to go home.  Okay to discharge patient from cardiology and gastroenterology standpoint.  Okay to resume Coumadin from GI standpoint ROS:  CONSTITUTIONAL: Denies fevers, chills. Denies any fatigue, weakness.  EYES: Denies blurry vision, double vision, eye pain. EARS, NOSE, THROAT: Denies tinnitus, ear pain, hearing loss. RESPIRATORY: Denies cough, wheeze, shortness of breath.  CARDIOVASCULAR: Denies chest pain, palpitations, edema.  GASTROINTESTINAL: Denies nausea, vomiting, diarrhea, abdominal pain. Denies bright red blood per rectum. GENITOURINARY: Denies dysuria, hematuria. ENDOCRINE: Denies nocturia or  thyroid problems. HEMATOLOGIC AND LYMPHATIC: Denies easy bruising or bleeding. SKIN: Denies rash or lesion. MUSCULOSKELETAL: Denies pain in neck, back, shoulder, knees, hips or arthritic symptoms.  NEUROLOGIC: Denies paralysis, paresthesias.  PSYCHIATRIC: Denies anxiety or depressive symptoms.   VITAL SIGNS:  Blood pressure (!) 104/51, pulse 98, temperature 98.9 F (37.2 C), temperature source Oral, resp. rate 18, height 6\' 1"  (1.854 m), weight 98.2 kg (216 lb 8 oz), SpO2 94 %.  I/O:    Intake/Output Summary (Last 24 hours) at 01/07/2017 1617 Last data filed at 01/07/2017 1504 Gross per 24 hour  Intake 660 ml  Output 3450 ml  Net -2790 ml    PHYSICAL EXAMINATION:  GENERAL:  83 y.o.-year-old patient lying in the bed with no acute distress.  EYES: Pupils equal, round, reactive to light and accommodation. No scleral icterus. Extraocular muscles intact.  HEENT: Head atraumatic, normocephalic. Oropharynx and nasopharynx clear.  NECK:  Supple, no jugular venous distention. No thyroid enlargement, no tenderness.  LUNGS: Normal breath sounds bilaterally, no wheezing, rales,rhonchi or crepitation. No use of accessory muscles of respiration.  CARDIOVASCULAR: S1, S2 normal. No murmurs, rubs, or gallops.  ABDOMEN: Soft, non-tender, non-distended. Bowel sounds present. No organomegaly or mass.  EXTREMITIES: No pedal edema, cyanosis, or clubbing.  NEUROLOGIC: Cranial nerves II through XII are intact. Muscle strength 5/5 in all extremities. Sensation intact. Gait not checked.  PSYCHIATRIC: The patient is alert and oriented x 3.  SKIN: No obvious rash, lesion, or ulcer.   DATA REVIEW:   CBC Recent Labs  Lab 01/07/17 0104 01/07/17 1256  WBC 10.9*  --   HGB 8.0* 7.7*  HCT 24.6* 23.2*  PLT 76*  --     Chemistries  Recent Labs  Lab 01/04/17 0552  01/07/17 0104  NA 134*   < > 137  K 4.2   < > 3.7  CL 102   < > 113*  CO2 23   < > 19*  GLUCOSE 184*   < > 103*  BUN 24*   < > 12   CREATININE 1.12   < > 0.85  CALCIUM 7.6*   < > 6.6*  AST 29  --   --   ALT 18  --   --   ALKPHOS 194*  --   --   BILITOT 0.9  --   --    < > = values in this interval not displayed.    Cardiac Enzymes Recent Labs  Lab 01/04/17 0552  TROPONINI <  0.03    Microbiology Results  Results for orders placed or performed during the hospital encounter of 07/04/15  Urine culture     Status: Abnormal   Collection Time: 07/04/15  1:23 PM  Result Value Ref Range Status   Specimen Description URINE, RANDOM  Final   Special Requests NONE  Final   Culture MULTIPLE SPECIES PRESENT, SUGGEST RECOLLECTION (A)  Final   Report Status 07/06/2015 FINAL  Final  Urine culture     Status: Abnormal   Collection Time: 07/16/15  6:45 PM  Result Value Ref Range Status   Specimen Description URINE, RANDOM  Final   Special Requests NONE  Final   Culture 7,000 COLONIES/mL INSIGNIFICANT GROWTH (A)  Final   Report Status 07/18/2015 FINAL  Final    RADIOLOGY:  Dg Chest Port 1 View  Result Date: 01/04/2017 CLINICAL DATA:  NT EXAM: PORTABLE CHEST 1 VIEW COMPARISON:  Chest radiograph 05/21/2015 FINDINGS: Cardiomegaly is unchanged. The left hemidiaphragm remains elevated. There is bibasilar atelectasis. No focal airspace consolidation or pulmonary edema. No pneumothorax or sizable pleural effusion. Previously demonstrated sclerotic mid to lower thoracic vertebral bodies are poorly visualized on the current study due to technical factors. IMPRESSION: Cardiomegaly and unchanged elevation of the left hemidiaphragm with bibasilar atelectasis. No acute cardiopulmonary disease. Electronically Signed   By: Ulyses Jarred M.D.   On: 01/04/2017 06:13    EKG:   Orders placed or performed during the hospital encounter of 01/04/17  . EKG 12-Lead  . EKG 12-Lead  . ED EKG  . ED EKG      Management plans discussed with the patient, family and they are in agreement.  CODE STATUS:     Code Status Orders  (From  admission, onward)        Start     Ordered   01/04/17 1043  Full code  Continuous     01/04/17 1042    Code Status History    Date Active Date Inactive Code Status Order ID Comments User Context   01/04/2017 08:30 01/04/2017 10:42 Full Code [id number]  Vaughan Basta, MD ED   05/30/2015 19:17 06/10/2015 16:57 DNR [id number]  Demetrios Loll, MD Inpatient   05/15/2015 19:54 05/23/2015 23:07 DNR [id number]  Hillary Bow, MD ED   05/01/2015 20:33 05/06/2015 21:24 Full Code [id number]  Demetrios Loll, MD Inpatient    Advance Directive Documentation     Most Recent Value  Type of Advance Directive  Living will, Healthcare Power of Attorney  Pre-existing out of facility DNR order (yellow form or pink MOST form)  No data  "MOST" Form in Place?  No data      TOTAL TIME TAKING CARE OF THIS PATIENT: 45 minutes.   Note: This dictation was prepared with Dragon dictation along with smaller phrase technology. Any transcriptional errors that result from this process are unintentional.   @[facility] @  on 01/07/2017 at 4:17 PM  Between 7am to 6pm - Pager - [phone]  After 6pm go to [email or web address] - password [email or web address] [facility]  [facility]  Office  [phone]  CC: Primary care physician; [facility]

## 2017-01-07 NOTE — Discharge Instructions (Signed)
Follow-up with primary care physician in 1 week Follow-up with gastroenterology in 2 weeks Soft diet for 1 week followed by cardiac diet Repeat PT/INR at cardiology or primary care physician's office in 2 days on 01/09/2017 and further Coumadin management by cardiology or primary care physician Follow-up with cardiology Dr. Nehemiah Massed in 2 weeks

## 2017-01-07 NOTE — Plan of Care (Signed)
Patient with no complaints of pain or discomfort. Pt states no BM's this shift. Hgb remains stable at 8

## 2017-01-08 LAB — H PYLORI, IGM, IGG, IGA AB: H Pylori IgG: <0.8 Index Value (ref 0.00–0.79)

## 2017-01-18 ENCOUNTER — Other Ambulatory Visit: Payer: Self-pay

## 2017-01-18 ENCOUNTER — Emergency Department: Payer: Medicare Other

## 2017-01-18 ENCOUNTER — Inpatient Hospital Stay
Admission: EM | Admit: 2017-01-18 | Discharge: 2017-02-02 | DRG: 377 | Disposition: E | Payer: Medicare Other | Attending: Internal Medicine | Admitting: Internal Medicine

## 2017-01-18 ENCOUNTER — Inpatient Hospital Stay: Payer: Medicare Other

## 2017-01-18 ENCOUNTER — Encounter: Payer: Self-pay | Admitting: Emergency Medicine

## 2017-01-18 DIAGNOSIS — R578 Other shock: Secondary | ICD-10-CM | POA: Diagnosis present

## 2017-01-18 DIAGNOSIS — E039 Hypothyroidism, unspecified: Secondary | ICD-10-CM | POA: Diagnosis present

## 2017-01-18 DIAGNOSIS — Z808 Family history of malignant neoplasm of other organs or systems: Secondary | ICD-10-CM

## 2017-01-18 DIAGNOSIS — E872 Acidosis: Secondary | ICD-10-CM | POA: Diagnosis present

## 2017-01-18 DIAGNOSIS — Z96651 Presence of right artificial knee joint: Secondary | ICD-10-CM | POA: Diagnosis present

## 2017-01-18 DIAGNOSIS — R6521 Severe sepsis with septic shock: Secondary | ICD-10-CM

## 2017-01-18 DIAGNOSIS — G9340 Encephalopathy, unspecified: Secondary | ICD-10-CM | POA: Diagnosis not present

## 2017-01-18 DIAGNOSIS — J90 Pleural effusion, not elsewhere classified: Secondary | ICD-10-CM | POA: Diagnosis present

## 2017-01-18 DIAGNOSIS — I9589 Other hypotension: Secondary | ICD-10-CM | POA: Diagnosis present

## 2017-01-18 DIAGNOSIS — R112 Nausea with vomiting, unspecified: Secondary | ICD-10-CM

## 2017-01-18 DIAGNOSIS — E870 Hyperosmolality and hypernatremia: Secondary | ICD-10-CM | POA: Diagnosis not present

## 2017-01-18 DIAGNOSIS — J9601 Acute respiratory failure with hypoxia: Secondary | ICD-10-CM | POA: Diagnosis not present

## 2017-01-18 DIAGNOSIS — D696 Thrombocytopenia, unspecified: Secondary | ICD-10-CM | POA: Diagnosis present

## 2017-01-18 DIAGNOSIS — K264 Chronic or unspecified duodenal ulcer with hemorrhage: Secondary | ICD-10-CM | POA: Diagnosis present

## 2017-01-18 DIAGNOSIS — R58 Hemorrhage, not elsewhere classified: Secondary | ICD-10-CM | POA: Diagnosis not present

## 2017-01-18 DIAGNOSIS — R0600 Dyspnea, unspecified: Secondary | ICD-10-CM

## 2017-01-18 DIAGNOSIS — R791 Abnormal coagulation profile: Secondary | ICD-10-CM | POA: Diagnosis present

## 2017-01-18 DIAGNOSIS — Z515 Encounter for palliative care: Secondary | ICD-10-CM | POA: Diagnosis not present

## 2017-01-18 DIAGNOSIS — K117 Disturbances of salivary secretion: Secondary | ICD-10-CM | POA: Diagnosis not present

## 2017-01-18 DIAGNOSIS — Z66 Do not resuscitate: Secondary | ICD-10-CM | POA: Diagnosis present

## 2017-01-18 DIAGNOSIS — Z4659 Encounter for fitting and adjustment of other gastrointestinal appliance and device: Secondary | ICD-10-CM

## 2017-01-18 DIAGNOSIS — Z01818 Encounter for other preprocedural examination: Secondary | ICD-10-CM

## 2017-01-18 DIAGNOSIS — J9621 Acute and chronic respiratory failure with hypoxia: Secondary | ICD-10-CM | POA: Diagnosis present

## 2017-01-18 DIAGNOSIS — J810 Acute pulmonary edema: Secondary | ICD-10-CM | POA: Diagnosis not present

## 2017-01-18 DIAGNOSIS — R0602 Shortness of breath: Secondary | ICD-10-CM

## 2017-01-18 DIAGNOSIS — J441 Chronic obstructive pulmonary disease with (acute) exacerbation: Secondary | ICD-10-CM | POA: Diagnosis present

## 2017-01-18 DIAGNOSIS — A419 Sepsis, unspecified organism: Secondary | ICD-10-CM | POA: Diagnosis present

## 2017-01-18 DIAGNOSIS — C61 Malignant neoplasm of prostate: Secondary | ICD-10-CM | POA: Diagnosis present

## 2017-01-18 DIAGNOSIS — J811 Chronic pulmonary edema: Secondary | ICD-10-CM | POA: Diagnosis present

## 2017-01-18 DIAGNOSIS — I482 Chronic atrial fibrillation: Secondary | ICD-10-CM | POA: Diagnosis present

## 2017-01-18 DIAGNOSIS — G9341 Metabolic encephalopathy: Secondary | ICD-10-CM | POA: Diagnosis not present

## 2017-01-18 DIAGNOSIS — Z87891 Personal history of nicotine dependence: Secondary | ICD-10-CM

## 2017-01-18 DIAGNOSIS — K567 Ileus, unspecified: Secondary | ICD-10-CM | POA: Diagnosis present

## 2017-01-18 DIAGNOSIS — R739 Hyperglycemia, unspecified: Secondary | ICD-10-CM | POA: Diagnosis not present

## 2017-01-18 DIAGNOSIS — C7951 Secondary malignant neoplasm of bone: Secondary | ICD-10-CM | POA: Diagnosis present

## 2017-01-18 DIAGNOSIS — N179 Acute kidney failure, unspecified: Secondary | ICD-10-CM | POA: Diagnosis present

## 2017-01-18 DIAGNOSIS — K920 Hematemesis: Secondary | ICD-10-CM | POA: Diagnosis not present

## 2017-01-18 DIAGNOSIS — K921 Melena: Secondary | ICD-10-CM | POA: Diagnosis not present

## 2017-01-18 DIAGNOSIS — Z7901 Long term (current) use of anticoagulants: Secondary | ICD-10-CM

## 2017-01-18 DIAGNOSIS — K269 Duodenal ulcer, unspecified as acute or chronic, without hemorrhage or perforation: Secondary | ICD-10-CM | POA: Diagnosis not present

## 2017-01-18 DIAGNOSIS — Z823 Family history of stroke: Secondary | ICD-10-CM

## 2017-01-18 DIAGNOSIS — D62 Acute posthemorrhagic anemia: Secondary | ICD-10-CM | POA: Diagnosis present

## 2017-01-18 DIAGNOSIS — R140 Abdominal distension (gaseous): Secondary | ICD-10-CM

## 2017-01-18 DIAGNOSIS — J9690 Respiratory failure, unspecified, unspecified whether with hypoxia or hypercapnia: Secondary | ICD-10-CM

## 2017-01-18 DIAGNOSIS — E785 Hyperlipidemia, unspecified: Secondary | ICD-10-CM | POA: Diagnosis present

## 2017-01-18 DIAGNOSIS — K922 Gastrointestinal hemorrhage, unspecified: Secondary | ICD-10-CM

## 2017-01-18 DIAGNOSIS — T380X5A Adverse effect of glucocorticoids and synthetic analogues, initial encounter: Secondary | ICD-10-CM | POA: Diagnosis not present

## 2017-01-18 LAB — BASIC METABOLIC PANEL
ANION GAP: 6 (ref 5–15)
Anion gap: 9 (ref 5–15)
BUN: 20 mg/dL (ref 6–20)
BUN: 28 mg/dL — ABNORMAL HIGH (ref 6–20)
CALCIUM: 7.1 mg/dL — AB (ref 8.9–10.3)
CALCIUM: 6.4 mg/dL — AB (ref 8.9–10.3)
CO2: 21 mmol/L — AB (ref 22–32)
CO2: 18 mmol/L — AB (ref 22–32)
CREATININE: 1.03 mg/dL (ref 0.61–1.24)
CREATININE: 1.13 mg/dL (ref 0.61–1.24)
Chloride: 111 mmol/L (ref 101–111)
Chloride: 110 mmol/L (ref 101–111)
GFR calc Af Amer: >60 mL/min (ref >60)
GFR calc non Af Amer: >60 mL/min (ref >60)
GFR, EST NON AFRICAN AMERICAN: 57 mL/min — AB (ref >60)
GLUCOSE: 129 mg/dL — AB (ref 65–99)
GLUCOSE: 242 mg/dL — AB (ref 65–99)
Potassium: 4.7 mmol/L (ref 3.5–5.1)
Potassium: 4.7 mmol/L (ref 3.5–5.1)
Sodium: 138 mmol/L (ref 135–145)
Sodium: 137 mmol/L (ref 135–145)

## 2017-01-18 LAB — CBC WITH DIFFERENTIAL/PLATELET
BASOS ABS: 0.1 10*3/uL (ref 0–0.1)
Basophils Relative: 1 %
EOS ABS: 0.1 10*3/uL (ref 0–0.7)
EOS PCT: 1 %
HCT: 27.6 % — ABNORMAL LOW (ref 40.0–52.0)
Hemoglobin: 9.2 g/dL — ABNORMAL LOW (ref 13.0–18.0)
LYMPHS PCT: 9 %
Lymphs Abs: 0.6 10*3/uL — ABNORMAL LOW (ref 1.0–3.6)
MCH: 33.8 pg (ref 26.0–34.0)
MCHC: 33.5 g/dL (ref 32.0–36.0)
MCV: 100.9 fL — ABNORMAL HIGH (ref 80.0–100.0)
Monocytes Absolute: 1 10*3/uL (ref 0.2–1.0)
Monocytes Relative: 15 %
NEUTROS PCT: 74 %
Neutro Abs: 5.2 10*3/uL (ref 1.4–6.5)
PLATELETS: 99 10*3/uL — AB (ref 150–440)
RBC: 2.74 MIL/uL — AB (ref 4.40–5.90)
RDW: 25.6 % — ABNORMAL HIGH (ref 11.5–14.5)
WBC: 7 10*3/uL (ref 3.8–10.6)

## 2017-01-18 LAB — GLUCOSE, CAPILLARY: Glucose-Capillary: 116 mg/dL — ABNORMAL HIGH (ref 65–99)

## 2017-01-18 LAB — CBC
HEMATOCRIT: 20.6 % — AB (ref 40.0–52.0)
Hemoglobin: 6.7 g/dL — ABNORMAL LOW (ref 13.0–18.0)
MCH: 32.9 pg (ref 26.0–34.0)
MCHC: 32.5 g/dL (ref 32.0–36.0)
MCV: 101.3 fL — AB (ref 80.0–100.0)
Platelets: 87 10*3/uL — ABNORMAL LOW (ref 150–440)
RBC: 2.03 MIL/uL — ABNORMAL LOW (ref 4.40–5.90)
RDW: 26.1 % — AB (ref 11.5–14.5)
WBC: 7.5 10*3/uL (ref 3.8–10.6)

## 2017-01-18 LAB — PHOSPHORUS
PHOSPHORUS: 2.9 mg/dL (ref 2.5–4.6)
Phosphorus: 1.4 mg/dL — ABNORMAL LOW (ref 2.5–4.6)

## 2017-01-18 LAB — URINALYSIS, ROUTINE W REFLEX MICROSCOPIC
Bacteria, UA: NONE SEEN
Bilirubin Urine: NEGATIVE
Glucose, UA: NEGATIVE mg/dL
Hgb urine dipstick: NEGATIVE
KETONES UR: NEGATIVE mg/dL
Nitrite: NEGATIVE
PH: 6 (ref 5.0–8.0)
Protein, ur: NEGATIVE mg/dL
SQUAMOUS EPITHELIAL / LPF: NONE SEEN
Specific Gravity, Urine: 1.016 (ref 1.005–1.030)

## 2017-01-18 LAB — LACTIC ACID, PLASMA
Lactic Acid, Venous: 3.5 mmol/L (ref 0.5–1.9)
Lactic Acid, Venous: 4 mmol/L (ref 0.5–1.9)

## 2017-01-18 LAB — COMPREHENSIVE METABOLIC PANEL
ALT: 14 U/L — ABNORMAL LOW (ref 17–63)
AST: 35 U/L (ref 15–41)
Albumin: 2.6 g/dL — ABNORMAL LOW (ref 3.5–5.0)
Alkaline Phosphatase: 179 U/L — ABNORMAL HIGH (ref 38–126)
Anion gap: 8 (ref 5–15)
BILIRUBIN TOTAL: 0.8 mg/dL (ref 0.3–1.2)
BUN: 17 mg/dL (ref 6–20)
CO2: 24 mmol/L (ref 22–32)
CREATININE: 1.15 mg/dL (ref 0.61–1.24)
Calcium: 7.7 mg/dL — ABNORMAL LOW (ref 8.9–10.3)
Chloride: 106 mmol/L (ref 101–111)
GFR, EST NON AFRICAN AMERICAN: 56 mL/min — AB (ref >60)
Glucose, Bld: 149 mg/dL — ABNORMAL HIGH (ref 65–99)
POTASSIUM: 4.6 mmol/L (ref 3.5–5.1)
Sodium: 138 mmol/L (ref 135–145)
TOTAL PROTEIN: 5.1 g/dL — AB (ref 6.5–8.1)

## 2017-01-18 LAB — MAGNESIUM
MAGNESIUM: 1.8 mg/dL (ref 1.7–2.4)
MAGNESIUM: 1.6 mg/dL — AB (ref 1.7–2.4)

## 2017-01-18 LAB — PROTIME-INR
INR: 2.91
INR: 2.44
PROTHROMBIN TIME: 26.3 s — AB (ref 11.4–15.2)
Prothrombin Time: 30.2 seconds — ABNORMAL HIGH (ref 11.4–15.2)

## 2017-01-18 LAB — APTT: aPTT: 43 seconds — ABNORMAL HIGH (ref 24–36)

## 2017-01-18 LAB — TSH: TSH: 5.461 u[IU]/mL — ABNORMAL HIGH (ref 0.350–4.500)

## 2017-01-18 LAB — PROCALCITONIN: PROCALCITONIN: 0.32 ng/mL

## 2017-01-18 LAB — MRSA PCR SCREENING: MRSA BY PCR: NEGATIVE

## 2017-01-18 LAB — TROPONIN I

## 2017-01-18 LAB — PREPARE RBC (CROSSMATCH)

## 2017-01-18 MED ORDER — DM-GUAIFENESIN ER 30-600 MG PO TB12: 1.0000 | ORAL_TABLET | Freq: Two times a day (BID) | ORAL | Status: DC

## 2017-01-18 MED ORDER — PIPERACILLIN-TAZOBACTAM 3.375 G IVPB
3.3750 g | Freq: Three times a day (TID) | INTRAVENOUS | Status: DC
Start: 2017-01-18 — End: 2017-01-21
  Administered 2017-01-18 – 2017-01-21 (×7): 3.375 g via INTRAVENOUS
  Filled 2017-01-18 (×7): qty 50

## 2017-01-18 MED ORDER — SODIUM CHLORIDE 0.9 % IV BOLUS (SEPSIS)
1000.0000 mL | Freq: Once | INTRAVENOUS | Status: AC
  Administered 2017-01-18: 1000 mL via INTRAVENOUS

## 2017-01-18 MED ORDER — ACETAMINOPHEN 325 MG PO TABS: 650.0000 mg | ORAL_TABLET | Freq: Four times a day (QID) | ORAL | Status: DC | PRN

## 2017-01-18 MED ORDER — PROMETHAZINE HCL 25 MG/ML IJ SOLN
12.5000 mg | INTRAMUSCULAR | Status: DC | PRN
  Filled 2017-01-18: qty 1

## 2017-01-18 MED ORDER — PROMETHAZINE HCL 25 MG/ML IJ SOLN
12.5000 mg | INTRAMUSCULAR | Status: DC | PRN
  Administered 2017-01-18: 25 mg via INTRAVENOUS

## 2017-01-18 MED ORDER — AMIODARONE IV BOLUS ONLY 150 MG/100ML
150.0000 mg | Freq: Once | INTRAVENOUS | Status: AC
  Administered 2017-01-18: 150 mg via INTRAVENOUS

## 2017-01-18 MED ORDER — VANCOMYCIN HCL IN DEXTROSE 1-5 GM/200ML-% IV SOLN
1000.0000 mg | Freq: Once | INTRAVENOUS | Status: AC
Start: 2017-01-18 — End: 2017-01-18
  Administered 2017-01-18: 1000 mg via INTRAVENOUS
  Filled 2017-01-18: qty 200

## 2017-01-18 MED ORDER — AMIODARONE HCL IN DEXTROSE 360-4.14 MG/200ML-% IV SOLN
60.0000 mg/h | INTRAVENOUS | Status: AC
  Administered 2017-01-19: 60 mg/h via INTRAVENOUS
  Filled 2017-01-18: qty 200

## 2017-01-18 MED ORDER — PANTOPRAZOLE SODIUM 40 MG IV SOLR
40.0000 mg | Freq: Two times a day (BID) | INTRAVENOUS | Status: DC
  Administered 2017-01-18 – 2017-01-26 (×16): 40 mg via INTRAVENOUS
  Filled 2017-01-18 (×16): qty 40

## 2017-01-18 MED ORDER — AMIODARONE HCL IN DEXTROSE 360-4.14 MG/200ML-% IV SOLN
30.0000 mg/h | INTRAVENOUS | Status: DC
  Administered 2017-01-19 – 2017-01-26 (×16): 30 mg/h via INTRAVENOUS
  Filled 2017-01-18 (×16): qty 200

## 2017-01-18 MED ORDER — PROMETHAZINE HCL 25 MG/ML IJ SOLN
12.5000 mg | Freq: Once | INTRAMUSCULAR | Status: AC
  Administered 2017-01-18: 12.5 mg via INTRAVENOUS

## 2017-01-18 MED ORDER — LORAZEPAM 0.5 MG PO TABS: 0.2500 mg | ORAL_TABLET | ORAL | Status: DC | PRN

## 2017-01-18 MED ORDER — IOPAMIDOL (ISOVUE-300) INJECTION 61%
15.0000 mL | INTRAVENOUS | Status: AC
  Administered 2017-01-18 (×2): 15 mL via ORAL

## 2017-01-18 MED ORDER — ONDANSETRON HCL 4 MG PO TABS: 4.0000 mg | ORAL_TABLET | Freq: Four times a day (QID) | ORAL | Status: DC | PRN

## 2017-01-18 MED ORDER — VANCOMYCIN HCL IN DEXTROSE 1-5 GM/200ML-% IV SOLN
1000.0000 mg | Freq: Three times a day (TID) | INTRAVENOUS | Status: DC
Start: 2017-01-18 — End: 2017-01-18

## 2017-01-18 MED ORDER — GUAIFENESIN ER 600 MG PO TB12
600.0000 mg | ORAL_TABLET | Freq: Two times a day (BID) | ORAL | Status: DC
  Administered 2017-01-18: 600 mg via ORAL
  Filled 2017-01-18: qty 1

## 2017-01-18 MED ORDER — VANCOMYCIN HCL 10 G IV SOLR
1250.0000 mg | Freq: Two times a day (BID) | INTRAVENOUS | Status: DC
  Filled 2017-01-18 (×2): qty 1250

## 2017-01-18 MED ORDER — ATORVASTATIN CALCIUM 80 MG PO TABS
80.0000 mg | ORAL_TABLET | Freq: Every day | ORAL | Status: DC
  Filled 2017-01-18 (×3): qty 1

## 2017-01-18 MED ORDER — DOCUSATE SODIUM 100 MG PO CAPS
100.0000 mg | ORAL_CAPSULE | Freq: Two times a day (BID) | ORAL | Status: DC
  Administered 2017-01-18: 100 mg via ORAL
  Filled 2017-01-18 (×4): qty 1

## 2017-01-18 MED ORDER — SODIUM CHLORIDE 0.9 % IV SOLN
Freq: Once | INTRAVENOUS | Status: AC
  Administered 2017-01-18: 11:00:00 via INTRAVENOUS

## 2017-01-18 MED ORDER — SODIUM GLYCEROPHOSPHATE 1 MMOLE/ML IV SOLN
20.0000 mmol | Freq: Once | INTRAVENOUS | Status: AC
  Administered 2017-01-18: 20 mmol via INTRAVENOUS
  Filled 2017-01-18: qty 20

## 2017-01-18 MED ORDER — MOMETASONE FURO-FORMOTEROL FUM 100-5 MCG/ACT IN AERO
2.0000 | INHALATION_SPRAY | Freq: Two times a day (BID) | RESPIRATORY_TRACT | Status: DC
  Administered 2017-01-18: 2 via RESPIRATORY_TRACT
  Filled 2017-01-18: qty 8.8

## 2017-01-18 MED ORDER — FERROUS SULFATE 325 (65 FE) MG PO TABS
325.0000 mg | ORAL_TABLET | Freq: Two times a day (BID) | ORAL | Status: DC
  Administered 2017-01-18: 325 mg via ORAL
  Filled 2017-01-18 (×3): qty 1

## 2017-01-18 MED ORDER — IPRATROPIUM-ALBUTEROL 0.5-2.5 (3) MG/3ML IN SOLN
RESPIRATORY_TRACT | Status: AC
  Filled 2017-01-18: qty 3

## 2017-01-18 MED ORDER — ACETAMINOPHEN 650 MG RE SUPP: 650.0000 mg | Freq: Four times a day (QID) | RECTAL | Status: DC | PRN

## 2017-01-18 MED ORDER — DIGOXIN 250 MCG PO TABS
0.2500 mg | ORAL_TABLET | Freq: Every day | ORAL | Status: DC
  Administered 2017-01-18 – 2017-01-20 (×2): 0.25 mg via ORAL
  Filled 2017-01-18 (×4): qty 1

## 2017-01-18 MED ORDER — ONDANSETRON HCL 4 MG/2ML IJ SOLN
4.0000 mg | Freq: Four times a day (QID) | INTRAMUSCULAR | Status: DC | PRN
  Administered 2017-01-18: 4 mg via INTRAVENOUS
  Filled 2017-01-18 (×3): qty 2

## 2017-01-18 MED ORDER — IPRATROPIUM-ALBUTEROL 0.5-2.5 (3) MG/3ML IN SOLN
3.0000 mL | Freq: Four times a day (QID) | RESPIRATORY_TRACT | Status: DC
  Administered 2017-01-18 – 2017-01-23 (×18): 3 mL via RESPIRATORY_TRACT
  Filled 2017-01-18 (×19): qty 3

## 2017-01-18 MED ORDER — TIOTROPIUM BROMIDE MONOHYDRATE 18 MCG IN CAPS
18.0000 ug | ORAL_CAPSULE | Freq: Every morning | RESPIRATORY_TRACT | Status: DC
  Filled 2017-01-18: qty 5

## 2017-01-18 MED ORDER — POLYETHYLENE GLYCOL 3350 17 G PO PACK
17.0000 g | PACK | Freq: Two times a day (BID) | ORAL | Status: DC
  Administered 2017-01-18 – 2017-01-22 (×2): 17 g via ORAL
  Filled 2017-01-18 (×3): qty 1

## 2017-01-18 MED ORDER — IOPAMIDOL (ISOVUE-370) INJECTION 76%
75.0000 mL | Freq: Once | INTRAVENOUS | Status: AC | PRN
  Administered 2017-01-18: 75 mL via INTRAVENOUS

## 2017-01-18 MED ORDER — TRAZODONE HCL 50 MG PO TABS
50.0000 mg | ORAL_TABLET | Freq: Every evening | ORAL | Status: DC | PRN
Start: 2017-01-18 — End: 2017-01-21

## 2017-01-18 MED ORDER — IPRATROPIUM-ALBUTEROL 0.5-2.5 (3) MG/3ML IN SOLN
3.0000 mL | Freq: Once | RESPIRATORY_TRACT | Status: AC
  Administered 2017-01-18: 05:00:00 via RESPIRATORY_TRACT

## 2017-01-18 MED ORDER — METHYLPREDNISOLONE SODIUM SUCC 125 MG IJ SOLR
60.0000 mg | Freq: Four times a day (QID) | INTRAMUSCULAR | Status: DC
  Administered 2017-01-18 – 2017-01-21 (×11): 60 mg via INTRAVENOUS
  Filled 2017-01-18 (×11): qty 2

## 2017-01-18 MED ORDER — AMIODARONE IV BOLUS ONLY 150 MG/100ML: 150.0000 mg | Freq: Once | INTRAVENOUS | Status: DC

## 2017-01-18 MED ORDER — ENSURE ENLIVE PO LIQD: 237.0000 mL | Freq: Three times a day (TID) | ORAL | Status: DC

## 2017-01-18 MED ORDER — SODIUM CHLORIDE 0.9 % IV BOLUS (SEPSIS)
1000.0000 mL | INTRAVENOUS | Status: AC
  Administered 2017-01-18: 1000 mL via INTRAVENOUS

## 2017-01-18 MED ORDER — HYDROCOD POLST-CPM POLST ER 10-8 MG/5ML PO SUER
5.0000 mL | Freq: Two times a day (BID) | ORAL | Status: DC | PRN
  Administered 2017-01-18: 5 mL via ORAL
  Filled 2017-01-18: qty 5

## 2017-01-18 MED ORDER — MAGNESIUM SULFATE 4 GM/100ML IV SOLN
4.0000 g | Freq: Once | INTRAVENOUS | Status: AC
  Administered 2017-01-19: 4 g via INTRAVENOUS
  Filled 2017-01-18: qty 100

## 2017-01-18 MED ORDER — METOPROLOL SUCCINATE ER 25 MG PO TB24
25.0000 mg | ORAL_TABLET | Freq: Every day | ORAL | Status: DC
  Administered 2017-01-21: 25 mg via ORAL
  Filled 2017-01-18 (×5): qty 1

## 2017-01-18 MED ORDER — COLLAGENASE 250 UNIT/GM EX OINT
1.0000 "application " | TOPICAL_OINTMENT | Freq: Two times a day (BID) | CUTANEOUS | Status: DC
  Administered 2017-01-19 – 2017-01-25 (×12): 1 via TOPICAL
  Filled 2017-01-18: qty 30

## 2017-01-18 MED ORDER — MIRTAZAPINE 15 MG PO TABS: 15.0000 mg | ORAL_TABLET | Freq: Every day | ORAL | Status: DC

## 2017-01-18 MED ORDER — AMIODARONE HCL IN DEXTROSE 360-4.14 MG/200ML-% IV SOLN
INTRAVENOUS | Status: AC
  Administered 2017-01-19: 60 mg/h via INTRAVENOUS
  Filled 2017-01-18: qty 200

## 2017-01-18 MED ORDER — VITAMIN K1 10 MG/ML IJ SOLN
10.0000 mg | INTRAVENOUS | Status: AC
  Administered 2017-01-19: 10 mg via INTRAVENOUS
  Filled 2017-01-18: qty 1

## 2017-01-18 MED ORDER — SODIUM CHLORIDE 0.9 % IV SOLN
1.0000 g | Freq: Once | INTRAVENOUS | Status: AC
  Administered 2017-01-19: 1 g via INTRAVENOUS
  Filled 2017-01-18: qty 10

## 2017-01-18 MED ORDER — SODIUM CHLORIDE 0.9 % IV SOLN
INTRAVENOUS | Status: DC
  Administered 2017-01-18: 150 mL/h via INTRAVENOUS
  Administered 2017-01-19 – 2017-01-21 (×4): via INTRAVENOUS

## 2017-01-18 MED ORDER — SODIUM CHLORIDE 0.9 % IV SOLN
Freq: Once | INTRAVENOUS | Status: AC
  Administered 2017-01-18: 14:00:00 via INTRAVENOUS

## 2017-01-18 MED ORDER — PIPERACILLIN-TAZOBACTAM 3.375 G IVPB 30 MIN
3.3750 g | Freq: Once | INTRAVENOUS | Status: AC
  Administered 2017-01-18: 3.375 g via INTRAVENOUS
  Filled 2017-01-18: qty 50

## 2017-01-18 MED ORDER — PANTOPRAZOLE SODIUM 40 MG PO TBEC
40.0000 mg | DELAYED_RELEASE_TABLET | Freq: Two times a day (BID) | ORAL | Status: DC
  Administered 2017-01-18: 40 mg via ORAL
  Filled 2017-01-18: qty 1

## 2017-01-18 MED ORDER — SODIUM CHLORIDE 0.9 % IV SOLN
0.0000 ug/min | INTRAVENOUS | Status: DC
  Administered 2017-01-18: 25 ug/min via INTRAVENOUS
  Administered 2017-01-18: 20 ug/min via INTRAVENOUS
  Administered 2017-01-19: 80 ug/min via INTRAVENOUS
  Administered 2017-01-19: 50 ug/min via INTRAVENOUS
  Administered 2017-01-19: 80 ug/min via INTRAVENOUS
  Administered 2017-01-19: 70 ug/min via INTRAVENOUS
  Administered 2017-01-19: 80 ug/min via INTRAVENOUS
  Administered 2017-01-20: 10 ug/min via INTRAVENOUS
  Filled 2017-01-18: qty 10
  Filled 2017-01-18: qty 1
  Filled 2017-01-18 (×4): qty 10
  Filled 2017-01-18: qty 1
  Filled 2017-01-18 (×2): qty 10
  Filled 2017-01-18 (×2): qty 1
  Filled 2017-01-18: qty 10

## 2017-01-18 MED ORDER — DEXTROMETHORPHAN POLISTIREX ER 30 MG/5ML PO SUER
30.0000 mg | Freq: Two times a day (BID) | ORAL | Status: DC
  Administered 2017-01-18 (×2): 30 mg via ORAL
  Filled 2017-01-18 (×7): qty 5

## 2017-01-18 NOTE — ED Notes (Signed)
Patient states, "I can tell the difference with this mask on. It's helping". Respirations have slowed and work of breathing has improved.

## 2017-01-18 NOTE — Consult Note (Addendum)
Chad Antigua, MD [address], [place], [place], [place], [address] [id number] [place], [place], [place], [address] Phone: [phone]  Fax: [phone]  Consultation  Referring Provider:     Dr. Manuella Ghazi Primary Care Physician:  [facility] Primary Gastroenterologist:  Virgel Manifold, MD        Reason for Consultation:     Anemia, Upper GI Bleed  Date of Admission:  01/29/2017 Date of Consultation:  01/13/2017         HPI:   Chad Kirby is a 83 y.o. male who presents with coughing and episodes of bright blood per rectum this morning.  Patient was recently discharged from the hospital 2 weeks ago at which time he had an EGD on 11/4 which showed a duodenal bulb ulcer with visible vessel that was treated with BiCAP.  Patient has A. fib and Coumadin was resumed on 11/7.  Patient has prostate cancer and follows up with [facility] and went there for his chemotherapy yesterday.  Since patient did not look well chemotherapy was not done.  His hemoglobin was checked at the time and was 8.3 and he reportedly received blood transfusion there.  After getting home patient felt okay and was able to eat this morning woke up with a cough and was found to have bright red blood streaks in his bed.  On presentation to the ED chest x-ray showed possible pneumonia and patient has been on antibiotics.  His hemoglobin on presentation was 9.2 which subsequently dropped to 6.7 a few hours later.  Patient has not had any bowel movement since being in the hospital or any episodes of emesis.  He has been taking p.o. Protonix since discharge.  His INR was 2.9 on presentation  Past Medical History:  Diagnosis Date  . Atrial fibrillation ([place])   . Cancer [facility])    Prostate with bone mets-current chemo treatment  . COPD (chronic obstructive pulmonary disease) ([place])   . Gastric ulcer   . Hyperlipidemia     Past Surgical History:  Procedure Laterality Date  . COLONOSCOPY WITH PROPOFOL N/A  05/18/2015   Performed by Hulen Luster, MD at [facility]  . ESOPHAGOGASTRODUODENOSCOPY (EGD) N/A 05/19/2015   Performed by Hulen Luster, MD at [facility] [facility]  . ESOPHAGOGASTRODUODENOSCOPY (EGD) WITH PROPOFOL N/A 01/06/2017   Performed by Efrain Sella, MD at [place]  . ESOPHAGOGASTRODUODENOSCOPY (EGD) WITH PROPOFOL N/A 06/02/2015   Performed by Lollie Sails, MD at [place]  . REPLACEMENT TOTAL KNEE Right     Prior to Admission medications   Medication Sig Start Date End Date Taking? Authorizing Provider  atorvastatin (LIPITOR) 80 MG tablet Take 80 mg by mouth at bedtime.   Yes [provider]  budesonide-formoterol (SYMBICORT) 80-4.5 MCG/ACT inhaler Inhale 2 puffs into the lungs 2 (two) times daily.   Yes [provider]  docusate sodium (COLACE) 100 MG capsule Take 1 capsule (100 mg total) 2 (two) times daily as needed by mouth for mild constipation. 01/07/17  Yes Gouru, Illene Silver, MD  ferrous sulfate 325 (65 FE) MG EC tablet Take 1 tablet (325 mg total) 2 (two) times daily by mouth. 01/07/17 01/07/18 Yes Gouru, Illene Silver, MD  metoprolol succinate (TOPROL-XL) 25 MG 24 hr tablet Take 25 mg by mouth daily.   Yes [provider]  pantoprazole (PROTONIX) 40 MG tablet Take 1 tablet (40 mg total) 2 (two) times daily by mouth. 01/07/17  Yes [name], MD  prochlorperazine (  COMPAZINE) 10 MG tablet Take 10 mg by mouth every 6 (six) hours as needed. For nausea and vomiting. 04/14/15  Yes [provider]  senna-docusate (SENOKOT-S) 8.6-50 MG tablet Take 1-2 tablets 2 (two) times daily as needed by mouth for mild constipation.   Yes [provider]  tiotropium (SPIRIVA) 18 MCG inhalation capsule Place 18 mcg into inhaler and inhale daily.   Yes [provider]  warfarin (COUMADIN) 3 MG tablet Take 1 tablet (3 mg total) daily by mouth. 01/07/17  Yes Gouru, Illene Silver, MD  digoxin (LANOXIN) 0.25 MG tablet Take 1 tablet (0.25 mg total) by mouth  daily. Patient not taking: Reported on 02/01/2017 06/10/15   Max Sane, MD  feeding supplement, ENSURE ENLIVE, (ENSURE ENLIVE) LIQD Take 237 mLs by mouth 3 (three) times daily with meals. 06/10/15   Max Sane, MD  gabapentin (NEURONTIN) 300 MG capsule Take 1 capsule (300 mg total) by mouth 3 (three) times daily. Patient not taking: Reported on 01/04/2017 06/10/15   Max Sane, MD  polyethylene glycol (MIRALAX / GLYCOLAX) packet Take 17 g by mouth 2 (two) times daily. Patient not taking: Reported on 01/14/2017 05/06/15   [name], MD    Family History  Problem Relation Age of Onset  . Brain cancer Sister   . Stroke Father      Social History   Tobacco Use  . Smoking status: Former Research scientist (life sciences)  . Smokeless tobacco: Never Used  Substance Use Topics  . Alcohol use: Yes    Alcohol/week: 0.0 oz    Comment: occasional  . Drug use: Not on file    Allergies as of 01/26/2017 - Review Complete 01/26/2017  Allergen Reaction Noted  . Latex Rash 05/01/2015    Review of Systems:    All systems reviewed and negative except where noted in HPI.   Physical Exam:  Vital signs in last 24 hours: Temp:  [97.5 F (36.4 C)-98.1 F (36.7 C)] 98 F (36.7 C) (11/16 1415) Pulse Rate:  [111-146] 119 (11/16 1400) Resp:  [15-33] 22 (11/16 1415) BP: (63-125)/(28-80) 99/52 (11/16 1400) SpO2:  [89 %-100 %] 100 % (11/16 1415)   General:   Pleasant, cooperative in NAD Head:  Normocephalic and atraumatic. Eyes:   No icterus.   Conjunctiva pink. PERRLA. Ears:  Normal auditory acuity. Neck:  Supple; no masses or thyroidomegaly Lungs: Respirations even and unlabored. Lungs clear to auscultation bilaterally.   No wheezes, crackles, or rhonchi.  Heart:  Regular rate and rhythm;  Without murmur, clicks, rubs or gallops Abdomen:  Soft, nondistended, nontender. Normal bowel sounds. No appreciable masses or hepatomegaly.  No rebound or guarding.  Neurologic:  Alert and oriented x3;  grossly normal  neurologically. Skin:  Intact without significant lesions or rashes. Cervical Nodes:  No significant cervical adenopathy. Psych:  Alert and cooperative. Normal affect.  LAB RESULTS: Recent Labs    01/26/2017 0452 01/22/2017 0812  WBC 7.0 7.5  HGB 9.2* 6.7*  HCT 27.6* 20.6*  PLT 99* 87*   BMET Recent Labs    01/06/2017 0452 01/17/2017 0812  NA 138 138  K 4.6 4.7  CL 106 111  CO2 24 21*  GLUCOSE 149* 129*  BUN 17 20  CREATININE 1.15 1.03  CALCIUM 7.7* 7.1*   LFT Recent Labs    01/17/2017 0452  PROT 5.1*  ALBUMIN 2.6*  AST 35  ALT 14*  ALKPHOS 179*  BILITOT 0.8   PT/INR Recent Labs    01/05/2017 0812  LABPROT 30.2*  INR 2.91    STUDIES: Ct Abdomen Pelvis Wo Contrast  Result Date: 01/30/2017 CLINICAL DATA:  Abdominal distention and low hemoglobin. History of prostate cancer. EXAM: CT ABDOMEN AND PELVIS WITHOUT CONTRAST TECHNIQUE: Multidetector CT imaging of the abdomen and pelvis was performed following the standard protocol without IV contrast. COMPARISON:  CTA chest 01/23/2017 CT abdomen pelvis 05/04/2015 FINDINGS: Lower chest: Small bilateral pleural effusions with associated atelectasis. Cardiomegaly. Hepatobiliary: Normal hepatic contours and density. No visible biliary dilatation. Normal gallbladder. Pancreas: Mild pancreatic atrophy, likely senescent. Spleen: Normal. Adrenals/Urinary Tract: --Adrenal glands: Normal. --Right kidney/ureter: Hyperdensities in multiple areas of the collecting system, favored to be due to recent IV contrast administration. --Left kidney/ureter: 7 mm left upper pole calculus. The other hyperdensities in the collecting system are likely due to recent contrast administration. --Urinary bladder: Opacification of the urinary bladder secondary to recent contrast administration for CTA chest. Stomach/Bowel: --Stomach/Duodenum: Patulous lower esophagus. Normal stomach and duodenum. --Small bowel: No dilatation or inflammation. --Colon: No focal  abnormality. --Appendix: Not visualized. No right lower quadrant inflammation or free fluid. Vascular/Lymphatic: There is atherosclerotic calcification of the non aneurysmal abdominal aorta. There is also calcification of the major branch vessels of the aorta. Normal course and caliber of the major abdominal vessels. No abdominal or pelvic lymphadenopathy. Reproductive: Normal size of the prostate gland with multiple calcifications in the left peripheral zone. Musculoskeletal. Extensive sclerotic metastatic disease of the thoracolumbar spine and pelvis, progressed relative to the study of 05/04/2015. No pathologic fracture or bony spinal canal stenosis. There is moderate to severe neural foraminal stenosis at L4-5 and L5-S1. There also multiple sclerotic metastases within the visible portions of the ribs. Other: None. IMPRESSION: 1. No acute abdominopelvic abnormality. 2. Small bilateral pleural effusions and moderate cardiomegaly. 3. Severe, extensive sclerotic metastatic disease of the pelvis, ribs and thoracolumbar spine, progressed relative to 05/04/2015. No pathologic fracture. 4.  Aortic Atherosclerosis (ICD10-I70.0). 5. Hyperdensities within both renal collecting systems and urinary bladder are secondary to recent IV contrast administration for CTA chest. Electronically Signed   By: Ulyses Jarred M.D.   On: 01/24/2017 13:57   Ct Angio Chest Pe W And/or Wo Contrast  Result Date: 01/26/2017 CLINICAL DATA:  Acute onset of difficulty breathing. Current history of prostate cancer. Hematochezia. EXAM: CT ANGIOGRAPHY CHEST WITH CONTRAST TECHNIQUE: Multidetector CT imaging of the chest was performed using the standard protocol during bolus administration of intravenous contrast. Multiplanar CT image reconstructions and MIPs were obtained to evaluate the vascular anatomy. CONTRAST:  24mL ISOVUE-370 IOPAMIDOL (ISOVUE-370) INJECTION 76% COMPARISON:  Chest radiograph performed earlier today at 4:50 a.m., and CT of  the chest performed 06/19/2004 FINDINGS: Cardiovascular: There is no evidence of pulmonary embolus. Evaluation is suboptimal in areas of airspace consolidation and scarring. The heart is mildly enlarged. Diffuse coronary artery calcifications are seen. Scattered calcification is noted along the thoracic aorta and proximal great vessels. Mediastinum/Nodes: The mediastinum is grossly unremarkable in appearance. No mediastinal lymphadenopathy is seen. No pericardial effusion is identified. The thyroid gland is unremarkable. No axillary lymphadenopathy is appreciated. Lungs/Pleura: Trace bilateral pleural effusions are noted. Mild left basilar airspace opacity likely reflects atelectasis, though pneumonia might have a similar appearance. No pneumothorax is seen. No masses are identified. A few blebs are noted at the right lung apex. Upper Abdomen: The visualized portions of the liver and spleen are unremarkable. The visualized portions of the adrenal glands are within normal limits. Musculoskeletal: Diffuse sclerosis throughout the visualized osseous structures reflects metastatic disease from the patient's  prostate cancer. The visualized musculature is unremarkable in appearance. Review of the MIP images confirms the above findings. IMPRESSION: 1. No evidence of pulmonary embolus. 2. Trace bilateral pleural effusions. Mild left basilar airspace opacity likely reflects atelectasis, though pneumonia might have a similar appearance. 3. Diffuse sclerosis throughout the visualized osseous structures reflects metastatic disease from the patient's prostate cancer. 4. Diffuse coronary artery calcifications seen.  Mild cardiomegaly. Electronically Signed   By: Garald Balding M.D.   On: 01/12/2017 06:55   Dg Chest Port 1 View  Result Date: 01/05/2017 CLINICAL DATA:  Shortness of breath. EXAM: PORTABLE CHEST 1 VIEW COMPARISON:  01/14/2017 FINDINGS: Examination limited by significant overlying artifact. The heart is enlarged  but stable. Stable tortuosity, ectasia and calcification of the thoracic aorta. Stable eventration of the left hemidiaphragm. No definite infiltrates or effusions. Streaky bibasilar atelectasis. Diffuse sclerotic metastatic bone disease. IMPRESSION: 1. Cardiac enlargement but no definite pulmonary edema. 2. Bibasilar atelectasis. Electronically Signed   By: Marijo Sanes M.D.   On: 01/13/2017 09:16   Dg Chest Port 1 View  Result Date: 01/10/2017 CLINICAL DATA:  Acute onset of shortness of breath and cough. EXAM: PORTABLE CHEST 1 VIEW COMPARISON:  Chest radiograph performed 01/04/2017 FINDINGS: There is elevation of the left hemidiaphragm. Mild right basilar airspace opacity may reflect pneumonia. No significant pleural effusion or pneumothorax is seen. The cardiomediastinal silhouette is borderline normal in size. No acute osseous abnormalities are seen. IMPRESSION: Elevation of the left hemidiaphragm. Mild right basilar airspace opacity may reflect pneumonia. Electronically Signed   By: Garald Balding M.D.   On: 01/11/2017 05:27      Impression / Plan:   Chad Kirby is a 83 y.o. y/o male with recent discharge from the hospital for upper GI bleed with EGD showing duodenal bulb ulcer with visible vessel, on Coumadin at home for A. fib presents with cough, possible pneumonia, acute anemia and bright red blood per rectum  Possible Upper GI Bleed from known duodenal ulcer LGIB is also a possibility given the BRBPR but less likely given the drop in Hgb and hypotension go along more with an UGIB Patient will need medical optimization prior to endoscopic procedures for the safety of the patient His INR is elevated.  Recommend reversing this with vitamin K and FFP's at this time Please give Protonix 40 mg IV twice daily, first dose now if not already given Continue serial CBCs and transfuse PRN. Avoid NSAIDs Source of bleeding is possibly his already known duodenal ulcer.   Hold anticoagulation at  this time until bleeding stabilizes Please page GI on call if patient develops signs of active GI bleeding.  He currently has not had any bowel movements or emesis since presentation. Continue to keep n.p.o. for now. Consider Cardiology consult inpatient, as risks and benefits of resuming anticoagulation will need to be discussed prior to discharge, given his known duodenal bulb ulcer seen 2 weeks ago and now repeat admission with GI bleeding  Thank you for involving me in the care of this patient.      LOS: 0 days   Virgel Manifold, MD  01/11/2017, 2:34 PM

## 2017-01-18 NOTE — ED Notes (Signed)
NO ANTIBIOTICS ORDERED PER MD BROWN.

## 2017-01-18 NOTE — Progress Notes (Signed)
Admitted to ICU this morning for shortness of breath, COPD exacerbation, septic shock with UTI, found to have severe anemia with hemoglobin 6.5.  Patient is getting blood transfusion.  Has history of prostate cancer with metastases, receiving immunotherapy,  Labs reviewed, vitals reviewed.  Metastatic prostate cancer: Getting immunotherapy at [facility].  Received infusion yesterday followed by transfusion of packed RBC.    2.  Chronic atrial fibrillation with GI bleeding: Hold anticoagulation, transfuse 2 units of packed RBC, GI is consulted, continue PPIs, consult cardiology.  3.  Septic shock with UTI:, Healthcare associated pneumonia.  Patient is on  Vancomycin, Zosyn, follow urine cultures, blood cultures also on pressors.  Aggressive hydration, Neo-Synephrine.  .  #4 ,GI bleeding.  Hold Coumadin, history of packed RBC yesterday at [facility] after infusion therapy, patient hemoglobin from 9.2-6.7 today, transfusions of packed RBC, GI consult, continue PPIs.  5.  History of COPD: Continue Dulera, DuoNeb's, Solu-Medrol Prognosis poor,  Code status full code Time spent 20 minutes.

## 2017-01-18 NOTE — Progress Notes (Signed)
Report received and care resumed by RN.

## 2017-01-18 NOTE — Progress Notes (Signed)
Pulmonary/critical care  Repeat hemoglobin significant decrease, GI has been consulted. 2 units of packed red blood cells are being ordered, INR was measured at 2.9, 4 units of fresh frozen plasma being transfused. Pending CT scan of the abdomen. If unable to wean Neo-Synephrine will place central access.  Hermelinda Dellen, D.O.

## 2017-01-18 NOTE — ED Notes (Signed)
RT at bedside to place patient on bipap 

## 2017-01-18 NOTE — ED Provider Notes (Signed)
[facility] Emergency Department Provider Note    First MD Initiated Contact with Patient 02/01/2017 [phone]     (approximate)  I have reviewed the triage vital signs and the nursing notes.   HISTORY  Chief Complaint Shortness of Breath and Rectal Bleeding   HPI Chad Kirby is a 83 y.o. male with below list of chronic medical conditions including metastatic prostate cancer presents to the emergency department via EMS with respiratory difficulty.  Per EMS on their arrival patient with pursed lip breathing and persistent cough.  Patient was seen at the cancer center at [facility] yesterday chest x-ray was performed which she states was normal.  Patient states however he has had progressive dyspnea over the course of the evening.  Patient denies any fever.  Patient denies any chest pain.  EMS states on arrival and the patient stood up he noted bright red blood per rectum.  Patient states that he had to 1 bloody bowel movement yesterday and one shortly before EMS arrival.  Patient states that he received his chemo infusion yesterday followed by 2 units of packed red blood cells   Past Medical History:  Diagnosis Date  . Atrial fibrillation ([place])   . Cancer [facility])    Prostate with bone mets-current chemo treatment  . COPD (chronic obstructive pulmonary disease) ([place])   . Gastric ulcer   . Hyperlipidemia     Patient Active Problem List   Diagnosis Date Noted  . GI bleed 01/04/2017  . GIB (gastrointestinal bleeding) 05/30/2015  . Anemia 05/30/2015  . Lower GI bleed 05/15/2015  . Cellulitis of left lower extremity   . Atrial fibrillation with RVR ([place]) 05/01/2015  . Sepsis ([place]) 05/01/2015  . Cellulitis and abscess of leg 05/01/2015    Past Surgical History:  Procedure Laterality Date  . COLONOSCOPY WITH PROPOFOL N/A 05/18/2015   Procedure: COLONOSCOPY WITH PROPOFOL;  Surgeon: Hulen Luster, MD;  Location: [facility] ENDOSCOPY;  Service: Endoscopy;   Laterality: N/A;  . ESOPHAGOGASTRODUODENOSCOPY N/A 05/19/2015   Procedure: ESOPHAGOGASTRODUODENOSCOPY (EGD);  Surgeon: Hulen Luster, MD;  Location: [facility] ENDOSCOPY;  Service: Endoscopy;  Laterality: N/A;  . ESOPHAGOGASTRODUODENOSCOPY (EGD) WITH PROPOFOL N/A 06/02/2015   Procedure: ESOPHAGOGASTRODUODENOSCOPY (EGD) WITH PROPOFOL;  Surgeon: Lollie Sails, MD;  Location: [facility] ENDOSCOPY;  Service: Endoscopy;  Laterality: N/A;  patient may require intubation  . ESOPHAGOGASTRODUODENOSCOPY (EGD) WITH PROPOFOL N/A 01/06/2017   Procedure: ESOPHAGOGASTRODUODENOSCOPY (EGD) WITH PROPOFOL;  Surgeon: Toledo, Benay Pike, MD;  Location: [facility] ENDOSCOPY;  Service: Gastroenterology;  Laterality: N/A;  . REPLACEMENT TOTAL KNEE Right     Prior to Admission medications   Medication Sig Start Date End Date Taking? Authorizing Provider  atorvastatin (LIPITOR) 80 MG tablet Take 80 mg by mouth at bedtime.    [provider]  budesonide-formoterol (SYMBICORT) 80-4.5 MCG/ACT inhaler Inhale 2 puffs into the lungs 2 (two) times daily.    [provider]  collagenase (SANTYL) ointment Apply 1 application topically 2 (two) times daily. Apply a thick layer to posterior left lower leg, cover with NS damp to dry gauze, wrap with kerlix    [provider]  digoxin (LANOXIN) 0.25 MG tablet Take 1 tablet (0.25 mg total) by mouth daily. 06/10/15   Max Sane, MD  docusate sodium (COLACE) 100 MG capsule Take 1 capsule (100 mg total) 2 (two) times daily as needed by mouth for mild constipation. 01/07/17   [name], MD  feeding supplement, ENSURE ENLIVE, (ENSURE ENLIVE) LIQD Take  237 mLs by mouth 3 (three) times daily with meals. 06/10/15   Max Sane, MD  ferrous sulfate 325 (65 FE) MG EC tablet Take 1 tablet (325 mg total) 2 (two) times daily by mouth. 01/07/17 01/07/18  [name], MD  gabapentin (NEURONTIN) 300 MG capsule Take 1 capsule (300 mg total) by mouth 3 (three) times daily. Patient not taking: Reported  on 01/04/2017 06/10/15   Max Sane, MD  LORazepam (ATIVAN) 0.5 MG tablet Take 0.25 mg by mouth every 4 (four) hours as needed for anxiety.    [provider]  metoprolol succinate (TOPROL-XL) 25 MG 24 hr tablet Take 25 mg by mouth daily.    [provider]  mirtazapine (REMERON) 15 MG tablet Take 15 mg by mouth at bedtime.    [provider]  pantoprazole (PROTONIX) 40 MG tablet Take 1 tablet (40 mg total) 2 (two) times daily by mouth. 01/07/17   Gouru, Illene Silver, MD  polyethylene glycol (MIRALAX / GLYCOLAX) packet Take 17 g by mouth 2 (two) times daily. 05/06/15   [name], MD  prochlorperazine (COMPAZINE) 10 MG tablet Take 10 mg by mouth every 6 (six) hours as needed. For nausea and vomiting. 04/14/15   [provider]  tiotropium (SPIRIVA) 18 MCG inhalation capsule Place 18 mcg into inhaler and inhale daily.    [provider]  traZODone (DESYREL) 50 MG tablet Take 50 mg by mouth at bedtime as needed for sleep.    [provider]  warfarin (COUMADIN) 3 MG tablet Take 1 tablet (3 mg total) daily by mouth. 01/07/17   [name], MD    Allergies Latex  Family History  Problem Relation Age of Onset  . Brain cancer Sister   . Stroke Father     Social History Social History   Tobacco Use  . Smoking status: Former Research scientist (life sciences)  . Smokeless tobacco: Never Used  Substance Use Topics  . Alcohol use: Yes    Alcohol/week: 0.0 oz    Comment: occasional  . Drug use: Not on file    Review of Systems Constitutional: No fever/chills Eyes: No visual changes. ENT: No sore throat. Cardiovascular: Denies chest pain. Respiratory: Denies shortness of breath.  Positive for cough Gastrointestinal: No abdominal pain.  No nausea, no vomiting.  No diarrhea.  No constipation.  Positive for rectal bleeding Genitourinary: Negative for dysuria. Musculoskeletal: Negative for neck pain.  Negative for back pain. Integumentary: Negative for rash. Neurological:  Negative for headaches, focal weakness or numbness.   ____________________________________________   PHYSICAL EXAM:  VITAL SIGNS: ED Triage Vitals [01/17/2017 0529]  Enc Vitals Group     BP      Pulse      Resp      Temp      Temp src      SpO2      Weight      Height      Head Circumference      Peak Flow      Pain Score 0     Pain Loc      Pain Edu?      Excl. in [place]?     Constitutional: Alert and oriented. Well appearing and in no acute distress. Eyes: Conjunctivae are well Head: Atraumatic. Mouth/Throat: Mucous membranes are moist. Oropharynx non-erythematous. Neck: No stridor.   Cardiovascular: Normal rate, regular rhythm. Good peripheral circulation. Grossly normal heart sounds. Respiratory: Normal respiratory effort.  No retractions.  Bibasilar rhonchi Gastrointestinal: Soft and nontender. No distention.  Guaiac positive Musculoskeletal: No lower extremity tenderness nor edema. No gross deformities of extremities. Neurologic:  Normal speech and language. No gross focal neurologic deficits are appreciated.  Skin:  Skin is warm, dry and intact. No rash noted. Psychiatric: Mood and affect are normal. Speech and behavior are normal.  ____________________________________________   LABS (all labs ordered are listed, but only abnormal results are displayed)  Labs Reviewed  COMPREHENSIVE METABOLIC PANEL - Abnormal; Notable for the following components:      Result Value   Glucose, Bld 149 (*)    Calcium 7.7 (*)    Total Protein 5.1 (*)    Albumin 2.6 (*)    ALT 14 (*)    Alkaline Phosphatase 179 (*)    GFR calc non Af Amer 56 (*)    All other components within normal limits  CBC WITH DIFFERENTIAL/PLATELET - Abnormal; Notable for the following components:   RBC 2.74 (*)    Hemoglobin 9.2 (*)    HCT 27.6 (*)    MCV 100.9 (*)    RDW 25.6 (*)    Platelets 99 (*)    Lymphs Abs 0.6 (*)    All other components within normal limits  LACTIC ACID, PLASMA - Abnormal;  Notable for the following components:   Lactic Acid, Venous 3.5 (*)    All other components within normal limits  CULTURE, BLOOD (ROUTINE X 2)  CULTURE, BLOOD (ROUTINE X 2)  URINALYSIS, ROUTINE W REFLEX MICROSCOPIC  TYPE AND SCREEN   ____________________________________________  EKG  Time: 4:56 AM Rate:96 Rhythm: Normal sinus rhythm Axis: Normal Interval: Normal ST segment and T wave: No ST segment changes hyperacute T waves.  ____________________________________________  RADIOLOGY I, Gregor Hams, personally viewed and evaluated these images (plain radiographs) as part of my medical decision making, as well as reviewing the written report by the radiologist.  Dg Chest Port 1 View  Result Date: 01/20/2017 CLINICAL DATA:  Acute onset of shortness of breath and cough. EXAM: PORTABLE CHEST 1 VIEW COMPARISON:  Chest radiograph performed 01/04/2017 FINDINGS: There is elevation of the left hemidiaphragm. Mild right basilar airspace opacity may reflect pneumonia. No significant pleural effusion or pneumothorax is seen. The cardiomediastinal silhouette is borderline normal in size. No acute osseous abnormalities are seen. IMPRESSION: Elevation of the left hemidiaphragm. Mild right basilar airspace opacity may reflect pneumonia. Electronically Signed   By: Garald Balding M.D.   On: 01/23/2017 05:27    ____________________________________________   PROCEDURES  Critical Care performed: CRITICAL CARE Performed by: Gregor Hams   Total critical care time: 60 minutes  Critical care time was exclusive of separately billable procedures and treating other patients.  Critical care was necessary to treat or prevent imminent or life-threatening deterioration.  Critical care was time spent personally by me on the following activities: development of treatment plan with patient and/or surrogate as well as nursing, discussions with consultants, evaluation of patient's response to  treatment, examination of patient, obtaining history from patient or surrogate, ordering and performing treatments and interventions, ordering and review of laboratory studies, ordering and review of radiographic studies, pulse oximetry and re-evaluation of patient's condition.   Procedures   ____________________________________________   INITIAL IMPRESSION / ASSESSMENT AND PLAN / ED COURSE  As part of my medical decision making, I reviewed the following data within the electronic MEDICAL RECORD [id number] year old male presented with above-stated history and physical exam concern for possible sepsis most likely secondary to UTI/pneumonia and as such received broad-spectrum antibiotic therapy with  vancomycin and Zosyn..  Patient also with a GI bleed and as such patient typed and screened.  Despite hemoglobin consistent with data reported yesterday at [facility] suspect that the patient's hemoglobin will decrease secondary to the amount of blood loss that the patient reports as well as what was visualized in the emergency department.  Given all these clinical concerns patient discussed with Dr. Marcille Blanco for hospital admission for further evaluation and management.     ____________________________________________  FINAL CLINICAL IMPRESSION(S) / ED DIAGNOSES  Final diagnoses:  Sepsis, due to unspecified organism [facility])  Lower GI bleed  Shortness of breath  Urinary tract infection   MEDICATIONS GIVEN DURING THIS VISIT:  Medications  vancomycin (VANCOCIN) IVPB 1000 mg/200 mL premix (1,000 mg Intravenous New Bag/Given 01/17/2017 0600)  piperacillin-tazobactam (ZOSYN) IVPB 3.375 g (3.375 g Intravenous New Bag/Given 01/13/2017 0557)  iopamidol (ISOVUE-370) 76 % injection 75 mL (not administered)  ipratropium-albuterol (DUONEB) 0.5-2.5 (3) MG/3ML nebulizer solution 3 mL ( Nebulization Given 01/17/2017 0450)  sodium chloride 0.9 % bolus 1,000 mL (1,000 mLs Intravenous New Bag/Given 01/08/2017 0456)     ED  Discharge Orders    None       Note:  This document was prepared using Dragon voice recognition software and may include unintentional dictation errors.    Gregor Hams, MD 01/21/2017 [phone]

## 2017-01-18 NOTE — ED Notes (Signed)
Ebony Hail, RN attempted to call report x1.

## 2017-01-18 NOTE — ED Notes (Signed)
Patient rolled prior to arrival to ICU to check for inconitence and blood from rectum. Brief was clean and dry. Patient noted to be tachycardic in the 130-140s after rolling. Patient transported to ICU by this RN, assisted by Jenny Reichmann, RT on cardiac monitor. Greeted by primary ICU RN, Leroy Sea as well as 2 other ICU staff RNs. Primary ICU RN Brad notified of change of heart rate. Patient denies any adverse symptoms.

## 2017-01-18 NOTE — Progress Notes (Addendum)
Pharmacy Antibiotic Note  Chad Kirby is a 83 y.o. male admitted on 01/14/2017 with sepsis and possible PNA.  Pharmacy has been consulted for Zosyn and Vancomycin dosing.  Patient received one dose of zosyn and vancomycin 1000mg  once in the ED.   Vancomycin was discontinued because MRSA PCR was negative.   Plan: Will start Zosyn 3.375g Q8H.    Temp (24hrs), Avg:97.5 F (36.4 C), Min:97.5 F (36.4 C), Max:97.5 F (36.4 C)  Recent Labs  Lab 02/01/2017 0452 01/08/2017 0717  WBC 7.0  --   CREATININE 1.15  --   LATICACIDVEN 3.5* 4.0*    Estimated Creatinine Clearance: 57.9 mL/min (by C-G formula based on SCr of 1.15 mg/dL).    Allergies  Allergen Reactions  . Latex Rash    Antimicrobials this admission: Vancomycin 11/16 >> 11/16 Zosyn 11/16 >>   Dose adjustments this admission:   Microbiology results: BCx: 11/16 >> pending  MRSA PCR: 11/16 >> NEGATIVE  Thank you for allowing pharmacy to be a part of this patient's care.  Lendon Ka, PharmD Pharmacy Resident 01/14/2017 8:34 AM

## 2017-01-18 NOTE — ED Triage Notes (Signed)
Pt bib [facility] from home d/t breathing difficulty. Pt was seen at cancer center yesterday for treatment for prostate cancer, but tx delayed d/t pt labored breathing-pt did receive 2 units blood. When EMS stood pt to stretcher, noticed significant bleeding from rectum pt and wife both denied knowledge of. 110/79 , 96% nonrebreather in route with EMS. Denies fever, denies nausea/vomiting/diarrhea.

## 2017-01-18 NOTE — Consult Note (Signed)
[facility] Consultation    SYNOPSIS   The patient with past medical history of prostate cancer, COPD and atrial fibrillation presents to the emergency department via EMS after an episode of shortness of breath.  ASSESSMENT/PLAN   Septic shock. Blood urine and sputum cultures, patient has been empirically started on vancomycin and Zosyn. Trending lactic acids, support hemodynamics. Patient was hypotensive and tachycardic, given boluses of normal saline and started on Neo-Synephrine. If unable to wean with fluid resuscitation will place central line  Respiratory distress. Was initially on noninvasive ventilation however change to supplemental oxygen secondary to nausea and vomiting. Given Zofran. If respiratory status deteriorates anymore will require intubation. Will add Solu-Medrol and bronchodilators secondary to cough and shortness of breath rhonchi and wheezing  GI bleed. Has had lower GI bleed. Was on anticoagulation, warfarin being held, received 2 units of packed red blood cells at [facility], last hemoglobin was 9. Will follow closely  Lactic acidosis. Will support hemodynamics. Pressors have been added.  Name: Chad Kirby MRN: [id number] DOB: 1929-04-25    ADMISSION DATE:  01/23/2017 CONSULTATION DATE:  01/17/2017  REFERRING MD :  Hospitalist service  CHIEF COMPLAINT:  Sepsis   HISTORY OF PRESENT ILLNESS:  Chad Kirby is a pleasant 83 year old gentleman with a past medical history remarkable for COPD, peptic ulcer disease, hyperlipidemia, atrial fibrillation, prostate cancer has been followed by [facility] has been on critical trials has received both chemotherapy and what sounds like immunotherapy. Was seen by [facility] yesterday and chemotherapy was not given. Was brought in by EMS secondary to increasing cough, shortness of breath, grossly bloody stools, possible sepsis. Arrival in the intensive care unit, patient was in distress, nausea, vomiting,  hypotensive. Fluid bolus given, started on Neo-Synephrine secondary to tachycardia. Stat chest x-ray did not reveal any significant change, started on Solu-Medrol, albuterol and Atrovent secondary to progressive cough and some wheezing appreciated.  PAST MEDICAL HISTORY :  Past Medical History:  Diagnosis Date  . Atrial fibrillation ([place])   . Cancer [facility])    Prostate with bone mets-current chemo treatment  . COPD (chronic obstructive pulmonary disease) ([place])   . Gastric ulcer   . Hyperlipidemia    Past Surgical History:  Procedure Laterality Date  . COLONOSCOPY WITH PROPOFOL N/A 05/18/2015   Procedure: COLONOSCOPY WITH PROPOFOL;  Surgeon: Hulen Luster, MD;  Location: [facility] ENDOSCOPY;  Service: Endoscopy;  Laterality: N/A;  . ESOPHAGOGASTRODUODENOSCOPY N/A 05/19/2015   Procedure: ESOPHAGOGASTRODUODENOSCOPY (EGD);  Surgeon: Hulen Luster, MD;  Location: [facility] ENDOSCOPY;  Service: Endoscopy;  Laterality: N/A;  . ESOPHAGOGASTRODUODENOSCOPY (EGD) WITH PROPOFOL N/A 06/02/2015   Procedure: ESOPHAGOGASTRODUODENOSCOPY (EGD) WITH PROPOFOL;  Surgeon: Lollie Sails, MD;  Location: [facility] ENDOSCOPY;  Service: Endoscopy;  Laterality: N/A;  patient may require intubation  . ESOPHAGOGASTRODUODENOSCOPY (EGD) WITH PROPOFOL N/A 01/06/2017   Procedure: ESOPHAGOGASTRODUODENOSCOPY (EGD) WITH PROPOFOL;  Surgeon: Toledo, Benay Pike, MD;  Location: [facility] ENDOSCOPY;  Service: Gastroenterology;  Laterality: N/A;  . REPLACEMENT TOTAL KNEE Right    Prior to Admission medications   Medication Sig Start Date End Date Taking? Authorizing Provider  atorvastatin (LIPITOR) 80 MG tablet Take 80 mg by mouth at bedtime.    [provider]  budesonide-formoterol (SYMBICORT) 80-4.5 MCG/ACT inhaler Inhale 2 puffs into the lungs 2 (two) times daily.    [provider]  collagenase (SANTYL) ointment Apply 1 application topically 2 (two) times daily. Apply a thick layer to posterior left lower leg, cover with NS damp to dry  gauze, wrap with kerlix    [provider]  digoxin (LANOXIN) 0.25 MG tablet Take 1 tablet (0.25 mg total) by mouth daily. 06/10/15   Max Sane, MD  docusate sodium (COLACE) 100 MG capsule Take 1 capsule (100 mg total) 2 (two) times daily as needed by mouth for mild constipation. 01/07/17   Gouru, Illene Silver, MD  feeding supplement, ENSURE ENLIVE, (ENSURE ENLIVE) LIQD Take 237 mLs by mouth 3 (three) times daily with meals. 06/10/15   Max Sane, MD  ferrous sulfate 325 (65 FE) MG EC tablet Take 1 tablet (325 mg total) 2 (two) times daily by mouth. 01/07/17 01/07/18  [name], MD  gabapentin (NEURONTIN) 300 MG capsule Take 1 capsule (300 mg total) by mouth 3 (three) times daily. Patient not taking: Reported on 01/04/2017 06/10/15   Max Sane, MD  LORazepam (ATIVAN) 0.5 MG tablet Take 0.25 mg by mouth every 4 (four) hours as needed for anxiety.    [provider]  metoprolol succinate (TOPROL-XL) 25 MG 24 hr tablet Take 25 mg by mouth daily.    [provider]  mirtazapine (REMERON) 15 MG tablet Take 15 mg by mouth at bedtime.    [provider]  pantoprazole (PROTONIX) 40 MG tablet Take 1 tablet (40 mg total) 2 (two) times daily by mouth. 01/07/17   Gouru, Illene Silver, MD  polyethylene glycol (MIRALAX / GLYCOLAX) packet Take 17 g by mouth 2 (two) times daily. 05/06/15   [name], MD  prochlorperazine (COMPAZINE) 10 MG tablet Take 10 mg by mouth every 6 (six) hours as needed. For nausea and vomiting. 04/14/15   [provider]  tiotropium (SPIRIVA) 18 MCG inhalation capsule Place 18 mcg into inhaler and inhale daily.    [provider]  traZODone (DESYREL) 50 MG tablet Take 50 mg by mouth at bedtime as needed for sleep.    [provider]  warfarin (COUMADIN) 3 MG tablet Take 1 tablet (3 mg total) daily by mouth. 01/07/17   [name], MD   Allergies  Allergen Reactions  . Latex Rash    FAMILY HISTORY:  Family History  Problem Relation Age of  Onset  . Brain cancer Sister   . Stroke Father    SOCIAL HISTORY:  reports that he has quit smoking. he has never used smokeless tobacco. He reports that he drinks alcohol. His drug history is not on file.  REVIEW OF SYSTEMS:   Please see history of present illness for complete pertinent review of systems. All else negative  VITAL SIGNS: Temp:  [97.5 F (36.4 C)-98.1 F (36.7 C)] 98.1 F (36.7 C) (11/16 0800) Pulse Rate:  [111-146] 120 (11/16 0800) Resp:  [15-33] 27 (11/16 0800) BP: (63-117)/(28-80) 63/28 (11/16 0800) SpO2:  [97 %-100 %] 99 % (11/16 0800)  INTAKE / OUTPUT:  Intake/Output Summary (Last 24 hours) at 01/09/2017 0903 Last data filed at 01/09/2017 0732 Gross per 24 hour  Intake 2000 ml  Output -  Net 2000 ml    Physical Examination:   VS: BP (!) 63/28   Pulse (!) 120   Temp 98.1 F (36.7 C) (Axillary)   Resp (!) 27   SpO2 99%   General Appearance: Patient in distress, coughing, short of breath, tachycardic and hypotensive Neuro:without focal findings, mental status, speech normal,. HEENT: PERRLA, EOM intact, no ptosis, no other lesions noticed;  Pulmonary: Expiratory rhonchi appreciated bilateral with some mild wheezing noted Cardiovascular tachycardia appreciated, some sinus mechanism noted.    Abdomen: Benign, Soft,  non-tender, No masses, hepatosplenomegaly, No lymphadenopathy Endoc: No evident thyromegaly, no signs of acromegaly. Skin:   warm, no rashes, no ecchymosis  Extremities: normal, no cyanosis, clubbing, no edema, warm with normal capillary refill.    LABS: Reviewed   LABORATORY PANEL:   CBC Recent Labs  Lab 01/30/2017 0452  WBC 7.0  HGB 9.2*  HCT 27.6*  PLT 99*    Chemistries  Recent Labs  Lab 02/01/2017 0452  NA 138  K 4.6  CL 106  CO2 24  GLUCOSE 149*  BUN 17  CREATININE 1.15  CALCIUM 7.7*  AST 35  ALT 14*  ALKPHOS 179*  BILITOT 0.8    Recent Labs  Lab 01/12/2017 0757  GLUCAP 116*   No results for input(s):  PHART, PCO2ART, PO2ART in the last 168 hours. Recent Labs  Lab 01/30/2017 0452  AST 35  ALT 14*  ALKPHOS 179*  BILITOT 0.8  ALBUMIN 2.6*    Cardiac Enzymes No results for input(s): TROPONINI in the last 168 hours.  RADIOLOGY:  Ct Angio Chest Pe W And/or Wo Contrast  Result Date: 01/28/2017 CLINICAL DATA:  Acute onset of difficulty breathing. Current history of prostate cancer. Hematochezia. EXAM: CT ANGIOGRAPHY CHEST WITH CONTRAST TECHNIQUE: Multidetector CT imaging of the chest was performed using the standard protocol during bolus administration of intravenous contrast. Multiplanar CT image reconstructions and MIPs were obtained to evaluate the vascular anatomy. CONTRAST:  39mL ISOVUE-370 IOPAMIDOL (ISOVUE-370) INJECTION 76% COMPARISON:  Chest radiograph performed earlier today at 4:50 a.m., and CT of the chest performed 06/19/2004 FINDINGS: Cardiovascular: There is no evidence of pulmonary embolus. Evaluation is suboptimal in areas of airspace consolidation and scarring. The heart is mildly enlarged. Diffuse coronary artery calcifications are seen. Scattered calcification is noted along the thoracic aorta and proximal great vessels. Mediastinum/Nodes: The mediastinum is grossly unremarkable in appearance. No mediastinal lymphadenopathy is seen. No pericardial effusion is identified. The thyroid gland is unremarkable. No axillary lymphadenopathy is appreciated. Lungs/Pleura: Trace bilateral pleural effusions are noted. Mild left basilar airspace opacity likely reflects atelectasis, though pneumonia might have a similar appearance. No pneumothorax is seen. No masses are identified. A few blebs are noted at the right lung apex. Upper Abdomen: The visualized portions of the liver and spleen are unremarkable. The visualized portions of the adrenal glands are within normal limits. Musculoskeletal: Diffuse sclerosis throughout the visualized osseous structures reflects metastatic disease from the  patient's prostate cancer. The visualized musculature is unremarkable in appearance. Review of the MIP images confirms the above findings. IMPRESSION: 1. No evidence of pulmonary embolus. 2. Trace bilateral pleural effusions. Mild left basilar airspace opacity likely reflects atelectasis, though pneumonia might have a similar appearance. 3. Diffuse sclerosis throughout the visualized osseous structures reflects metastatic disease from the patient's prostate cancer. 4. Diffuse coronary artery calcifications seen.  Mild cardiomegaly. Electronically Signed   By: Garald Balding M.D.   On: 01/13/2017 06:55   Dg Chest Port 1 View  Result Date: 01/23/2017 CLINICAL DATA:  Acute onset of shortness of breath and cough. EXAM: PORTABLE CHEST 1 VIEW COMPARISON:  Chest radiograph performed 01/04/2017 FINDINGS: There is elevation of the left hemidiaphragm. Mild right basilar airspace opacity may reflect pneumonia. No significant pleural effusion or pneumothorax is seen. The cardiomediastinal silhouette is borderline normal in size. No acute osseous abnormalities are seen. IMPRESSION: Elevation of the left hemidiaphragm. Mild right basilar airspace opacity may reflect pneumonia. Electronically Signed   By: Garald Balding M.D.   On: 01/26/2017 05:27  Hermelinda Dellen, DO  01/12/2017, 9:03 AM

## 2017-01-18 NOTE — ED Notes (Signed)
Family at bedside. 

## 2017-01-18 NOTE — H&P (Signed)
Chad Kirby is an 83 y.o. male.   Chief Complaint: Shortness of breath HPI: The patient with past medical history of prostate cancer, COPD and atrial fibrillation presents to the emergency department via EMS after an episode of shortness of breath.  The patient was discharged late yesterday evening from [facility] following the chemotherapy for his prostate cancer.  He returned home but progressively became more short of breath.  He woke his wife at night due to his coughing.  They called EMS who found him to have had a grossly bloody stool in bed.  Patient continued to have episodes of severe cough in transport and eventually required BiPAP in the emergency department.  Chest x-ray showed possible pneumonia which prompted the initiation of sepsis protocol prior to the emergency department staff calling the hospitalist service for admission.  Past Medical History:  Diagnosis Date  . Atrial fibrillation ([facility])   . Cancer ([facility])    Prostate with bone mets-current chemo treatment  . COPD (chronic obstructive pulmonary disease) ([facility])   . Gastric ulcer   . Hyperlipidemia     Past Surgical History:  Procedure Laterality Date  . COLONOSCOPY WITH PROPOFOL N/A 05/18/2015   Procedure: COLONOSCOPY WITH PROPOFOL;  Surgeon: Paul Y Oh, MD;  Location: [facility] ENDOSCOPY;  Service: Endoscopy;  Laterality: N/A;  . ESOPHAGOGASTRODUODENOSCOPY N/A 05/19/2015   Procedure: ESOPHAGOGASTRODUODENOSCOPY (EGD);  Surgeon: Paul Y Oh, MD;  Location: [facility] ENDOSCOPY;  Service: Endoscopy;  Laterality: N/A;  . ESOPHAGOGASTRODUODENOSCOPY (EGD) WITH PROPOFOL N/A 06/02/2015   Procedure: ESOPHAGOGASTRODUODENOSCOPY (EGD) WITH PROPOFOL;  Surgeon: Martin U Skulskie, MD;  Location: [facility] ENDOSCOPY;  Service: Endoscopy;  Laterality: N/A;  patient may require intubation  . ESOPHAGOGASTRODUODENOSCOPY (EGD) WITH PROPOFOL N/A 01/06/2017   Procedure: ESOPHAGOGASTRODUODENOSCOPY (EGD) WITH PROPOFOL;  Surgeon: Toledo, Teodoro K,  MD;  Location: [facility] ENDOSCOPY;  Service: Gastroenterology;  Laterality: N/A;  . REPLACEMENT TOTAL KNEE Right     Family History  Problem Relation Age of Onset  . Brain cancer Sister   . Stroke Father    Social History:  reports that he has quit smoking. he has never used smokeless tobacco. He reports that he drinks alcohol. His drug history is not on file.  Allergies:  Allergies  Allergen Reactions  . Latex Rash     (Not in a hospital admission)  Results for orders placed or performed during the hospital encounter of 01/16/2017 (from the past 48 hour(s))  Comprehensive metabolic panel     Status: Abnormal   Collection Time: 01/17/2017  4:52 AM  Result Value Ref Range   Sodium 138 135 - 145 mmol/L   Potassium 4.6 3.5 - 5.1 mmol/L    Comment: HEMOLYSIS AT THIS LEVEL MAY AFFECT RESULT   Chloride 106 101 - 111 mmol/L   CO2 24 22 - 32 mmol/L   Glucose, Bld 149 (H) 65 - 99 mg/dL   BUN 17 6 - 20 mg/dL   Creatinine, Ser 1.15 0.61 - 1.24 mg/dL   Calcium 7.7 (L) 8.9 - 10.3 mg/dL   Total Protein 5.1 (L) 6.5 - 8.1 g/dL   Albumin 2.6 (L) 3.5 - 5.0 g/dL   AST 35 15 - 41 U/L   ALT 14 (L) 17 - 63 U/L   Alkaline Phosphatase 179 (H) 38 - 126 U/L   Total Bilirubin 0.8 0.3 - 1.2 mg/dL   GFR calc non Af Amer 56 (L) >60 mL/min   GFR calc Af Amer >60 >60 mL/min    Comment: (  NOTE) The eGFR has been calculated using the CKD EPI equation. This calculation has not been validated in all clinical situations. eGFR's persistently <60 mL/min signify possible Chronic Kidney Disease.    Anion gap 8 5 - 15  CBC WITH DIFFERENTIAL     Status: Abnormal   Collection Time: 01/28/2017  4:52 AM  Result Value Ref Range   WBC 7.0 3.8 - 10.6 K/uL   RBC 2.74 (L) 4.40 - 5.90 MIL/uL   Hemoglobin 9.2 (L) 13.0 - 18.0 g/dL   HCT 27.6 (L) 40.0 - 52.0 %   MCV 100.9 (H) 80.0 - 100.0 fL   MCH 33.8 26.0 - 34.0 pg   MCHC 33.5 32.0 - 36.0 g/dL   RDW 25.6 (H) 11.5 - 14.5 %   Platelets 99 (L) 150 - 440 K/uL    Comment:  RESULT REPEATED AND VERIFIED   Neutrophils Relative % 74 %   Neutro Abs 5.2 1.4 - 6.5 K/uL   Lymphocytes Relative 9 %   Lymphs Abs 0.6 (L) 1.0 - 3.6 K/uL   Monocytes Relative 15 %   Monocytes Absolute 1.0 0.2 - 1.0 K/uL   Eosinophils Relative 1 %   Eosinophils Absolute 0.1 0 - 0.7 K/uL   Basophils Relative 1 %   Basophils Absolute 0.1 0 - 0.1 K/uL  Lactic acid, plasma     Status: Abnormal   Collection Time:   4:52 AM  Result Value Ref Range   Lactic Acid, Venous 3.5 (HH) 0.5 - 1.9 mmol/L    Comment: CRITICAL RESULT CALLED TO, READ BACK BY AND VERIFIED WITH ALLISON PATE AT 0540 01/17/2017 ALV   Type and screen [facility]     Status: None   Collection Time: 01/24/2017  4:52 AM  Result Value Ref Range   ABO/RH(D) A POS    Antibody Screen NEG    Sample Expiration 01/21/2017    Dg Chest Port 1 View  Result Date: 01/04/2017 CLINICAL DATA:  Acute onset of shortness of breath and cough. EXAM: PORTABLE CHEST 1 VIEW COMPARISON:  Chest radiograph performed 01/04/2017 FINDINGS: There is elevation of the left hemidiaphragm. Mild right basilar airspace opacity may reflect pneumonia. No significant pleural effusion or pneumothorax is seen. The cardiomediastinal silhouette is borderline normal in size. No acute osseous abnormalities are seen. IMPRESSION: Elevation of the left hemidiaphragm. Mild right basilar airspace opacity may reflect pneumonia. Electronically Signed   By: Jeffery  Chang M.D.   On: 02/01/2017 05:27    Review of Systems  Constitutional: Negative for chills and fever.  HENT: Negative for sore throat and tinnitus.   Eyes: Negative for blurred vision and redness.  Respiratory: Positive for cough and shortness of breath.   Cardiovascular: Negative for chest pain, palpitations, orthopnea and PND.  Gastrointestinal: Positive for blood in stool. Negative for abdominal pain, diarrhea, nausea and vomiting.  Genitourinary: Negative for dysuria, frequency and  urgency.  Musculoskeletal: Negative for joint pain and myalgias.  Skin: Negative for rash.       No lesions  Neurological: Negative for speech change, focal weakness and weakness.  Endo/Heme/Allergies: Does not bruise/bleed easily.       No temperature intolerance  Psychiatric/Behavioral: Negative for depression and suicidal ideas.    Blood pressure (!) 94/38, pulse (!) 146, temperature (!) 97.5 F (36.4 C), temperature source Axillary, resp. rate (!) 26, SpO2 99 %. Physical Exam  Vitals reviewed. Constitutional: He is oriented to person, place, and time. He appears well-developed and well-nourished. No distress.    HENT:  Head: Normocephalic and atraumatic.  Mouth/Throat: Oropharynx is clear and moist.  Eyes: Conjunctivae and EOM are normal. Pupils are equal, round, and reactive to light. No scleral icterus.  Neck: Normal range of motion. Neck supple. No JVD present. No tracheal deviation present. No thyromegaly present.  Cardiovascular: Normal rate. An irregularly irregular rhythm present. Exam reveals no gallop and no friction rub.  No murmur heard. GI: Soft. Bowel sounds are normal. He exhibits distension. There is no tenderness.  Genitourinary:  Genitourinary Comments: Deferred  Musculoskeletal: Normal range of motion. He exhibits no edema.  Lymphadenopathy:    He has no cervical adenopathy.  Neurological: He is alert and oriented to person, place, and time. No cranial nerve deficit.  Skin: Skin is warm and dry. No rash noted. No erythema.  Multiple areas of ecchymosis as well as ulceration on lower extremities  Psychiatric: He has a normal mood and affect. His behavior is normal. Judgment and thought content normal.     Assessment/Plan This is an 83-year-old male admitted for sepsis and GI bleed. 1.  Sepsis: The patient meets criteria for septic shock due to tachycardia, tachypnea and hypotension. Lactic acid is elevated.  Episodes of hypotension have been fluid responsive.   Continue to hydrate aggressively with normal saline.  Vancomycin and Zosyn on board.  Follow blood cultures for growth and sensitivities. 2.  Pneumonia: Hospital-associated; likely source of sepsis.  Proximal systems of cough and increased work of breathing.  The patient is been placed on BiPAP.  Sclerosis seen on chest x-ray.  Continue inhaled corticosteroid.  Breathing treatments as necessary. 3.  GI bleed: Painless bleeding.  The patient has been on warfarin.  I have held this medication for now.  INR is 1.4;  allow to trend downward unless further bleeding which may require vitamin K.  The patient received 2 units packed red blood cells yesterday at [facility].  Recheck hemoglobin and hematocrit following equilibration to assess need for further transfusion.  Consult gastroenterology. 4.  Atrial fibrillation: Rate controlled; anticoagulation held.  Continue digoxin and metoprolol 5.  Prostate cancer: Metastatic; underwent chemotherapy treatment yesterday. 6.  DVT prophylaxis: SCDs 7.  GI prophylaxis: Protonix The patient is a full code.  Time spent on admission orders and patient care proximally 45 minutes. Discussed with ICU staff.   ,   S, MD 01/15/2017, 6:38 AM   

## 2017-01-18 NOTE — ED Notes (Signed)
ED Provider at bedside. 

## 2017-01-19 ENCOUNTER — Inpatient Hospital Stay: Payer: Medicare Other | Admitting: Certified Registered Nurse Anesthetist

## 2017-01-19 ENCOUNTER — Inpatient Hospital Stay (HOSPITAL_COMMUNITY): Payer: Medicare Other

## 2017-01-19 ENCOUNTER — Inpatient Hospital Stay: Payer: Medicare Other

## 2017-01-19 ENCOUNTER — Other Ambulatory Visit (HOSPITAL_COMMUNITY): Payer: Medicare Other

## 2017-01-19 ENCOUNTER — Encounter: Payer: Self-pay | Admitting: Anesthesiology

## 2017-01-19 ENCOUNTER — Encounter: Admission: EM | Disposition: E | Payer: Self-pay | Source: Home / Self Care | Attending: Internal Medicine

## 2017-01-19 DIAGNOSIS — K921 Melena: Secondary | ICD-10-CM

## 2017-01-19 DIAGNOSIS — K922 Gastrointestinal hemorrhage, unspecified: Secondary | ICD-10-CM

## 2017-01-19 HISTORY — PX: ESOPHAGOGASTRODUODENOSCOPY (EGD) WITH PROPOFOL: SHX5813

## 2017-01-19 LAB — CBC
HCT: 19.1 % — ABNORMAL LOW (ref 40.0–52.0)
HEMATOCRIT: 16.6 % — AB (ref 40.0–52.0)
HEMATOCRIT: 20 % — AB (ref 40.0–52.0)
HEMATOCRIT: 38 % — AB (ref 40.0–52.0)
HEMOGLOBIN: 5.5 g/dL — AB (ref 13.0–18.0)
HEMOGLOBIN: 12.7 g/dL — AB (ref 13.0–18.0)
Hemoglobin: 6.1 g/dL — ABNORMAL LOW (ref 13.0–18.0)
Hemoglobin: 6.6 g/dL — ABNORMAL LOW (ref 13.0–18.0)
MCH: 31 pg (ref 26.0–34.0)
MCH: 31.6 pg (ref 26.0–34.0)
MCH: 32.4 pg (ref 26.0–34.0)
MCH: 30.3 pg (ref 26.0–34.0)
MCHC: 32 g/dL (ref 32.0–36.0)
MCHC: 32.9 g/dL (ref 32.0–36.0)
MCHC: 33.1 g/dL (ref 32.0–36.0)
MCHC: 33.4 g/dL (ref 32.0–36.0)
MCV: 97 fL (ref 80.0–100.0)
MCV: 96.1 fL (ref 80.0–100.0)
MCV: 97.8 fL (ref 80.0–100.0)
MCV: 90.7 fL (ref 80.0–100.0)
PLATELETS: 82 10*3/uL — AB (ref 150–440)
Platelets: 79 10*3/uL — ABNORMAL LOW (ref 150–440)
Platelets: 81 10*3/uL — ABNORMAL LOW (ref 150–440)
Platelets: 53 10*3/uL — ABNORMAL LOW (ref 150–440)
RBC: 1.97 MIL/uL — ABNORMAL LOW (ref 4.40–5.90)
RBC: 1.72 MIL/uL — AB (ref 4.40–5.90)
RBC: 2.05 MIL/uL — AB (ref 4.40–5.90)
RBC: 4.19 MIL/uL — AB (ref 4.40–5.90)
RDW: 24 % — ABNORMAL HIGH (ref 11.5–14.5)
RDW: 23.8 % — AB (ref 11.5–14.5)
RDW: 20.6 % — ABNORMAL HIGH (ref 11.5–14.5)
RDW: 15.2 % — ABNORMAL HIGH (ref 11.5–14.5)
WBC: 5.4 10*3/uL (ref 3.8–10.6)
WBC: 8.8 10*3/uL (ref 3.8–10.6)
WBC: 10 10*3/uL (ref 3.8–10.6)
WBC: 15.1 10*3/uL — ABNORMAL HIGH (ref 3.8–10.6)

## 2017-01-19 LAB — BASIC METABOLIC PANEL
ANION GAP: 7 (ref 5–15)
Anion gap: 8 (ref 5–15)
BUN: 26 mg/dL — AB (ref 6–20)
BUN: 25 mg/dL — AB (ref 6–20)
CHLORIDE: 111 mmol/L (ref 101–111)
CO2: 17 mmol/L — AB (ref 22–32)
CO2: 19 mmol/L — AB (ref 22–32)
Calcium: 6.7 mg/dL — ABNORMAL LOW (ref 8.9–10.3)
Calcium: 6.3 mg/dL — CL (ref 8.9–10.3)
Chloride: 109 mmol/L (ref 101–111)
Creatinine, Ser: 1.07 mg/dL (ref 0.61–1.24)
Creatinine, Ser: 0.95 mg/dL (ref 0.61–1.24)
GFR calc Af Amer: >60 mL/min (ref >60)
GFR calc non Af Amer: >60 mL/min (ref >60)
GFR calc non Af Amer: >60 mL/min (ref >60)
GLUCOSE: 186 mg/dL — AB (ref 65–99)
Glucose, Bld: 201 mg/dL — ABNORMAL HIGH (ref 65–99)
POTASSIUM: 4.1 mmol/L (ref 3.5–5.1)
POTASSIUM: 4.6 mmol/L (ref 3.5–5.1)
SODIUM: 136 mmol/L (ref 135–145)
Sodium: 135 mmol/L (ref 135–145)

## 2017-01-19 LAB — BLOOD GAS, ARTERIAL
ACID-BASE DEFICIT: 9.7 mmol/L — AB (ref 0.0–2.0)
Bicarbonate: 20.2 mmol/L (ref 20.0–28.0)
FIO2: 0.8
Mechanical Rate: 15
O2 SAT: 78.5 %
PATIENT TEMPERATURE: 37
PCO2 ART: 62 mmHg — AB (ref 32.0–48.0)
PEEP/CPAP: 5 cmH2O
PH ART: 7.12 — AB (ref 7.350–7.450)
RATE: 15 resp/min
VT: 500 mL
pO2, Arterial: 58 mmHg — ABNORMAL LOW (ref 83.0–108.0)

## 2017-01-19 LAB — LACTIC ACID, PLASMA
Lactic Acid, Venous: 4.5 mmol/L (ref 0.5–1.9)
Lactic Acid, Venous: 2.1 mmol/L (ref 0.5–1.9)

## 2017-01-19 LAB — PROCALCITONIN: PROCALCITONIN: 0.45 ng/mL

## 2017-01-19 LAB — MAGNESIUM
MAGNESIUM: 2.3 mg/dL (ref 1.7–2.4)
Magnesium: 2.4 mg/dL (ref 1.7–2.4)

## 2017-01-19 LAB — PHOSPHORUS
PHOSPHORUS: 2.7 mg/dL (ref 2.5–4.6)
PHOSPHORUS: 3.1 mg/dL (ref 2.5–4.6)

## 2017-01-19 LAB — PREPARE RBC (CROSSMATCH)

## 2017-01-19 LAB — HEMOGLOBIN: HEMOGLOBIN: 12.5 g/dL — AB (ref 13.0–18.0)

## 2017-01-19 LAB — PROTIME-INR
INR: 1.46
INR: 1.15
PROTHROMBIN TIME: 17.6 s — AB (ref 11.4–15.2)
Prothrombin Time: 14.6 seconds (ref 11.4–15.2)

## 2017-01-19 LAB — ALBUMIN: ALBUMIN: 2.7 g/dL — AB (ref 3.5–5.0)

## 2017-01-19 LAB — APTT: aPTT: 26 seconds (ref 24–36)

## 2017-01-19 LAB — TROPONIN I: Troponin I: <0.03 ng/mL (ref <0.03)

## 2017-01-19 SURGERY — ESOPHAGOGASTRODUODENOSCOPY (EGD) WITH PROPOFOL
Anesthesia: General

## 2017-01-19 MED ORDER — SODIUM CHLORIDE 0.9 % IV SOLN
1.0000 g | Freq: Once | INTRAVENOUS | Status: AC
  Administered 2017-01-19: 1 g via INTRAVENOUS
  Filled 2017-01-19: qty 10

## 2017-01-19 MED ORDER — CHLORHEXIDINE GLUCONATE 0.12% ORAL RINSE (MEDLINE KIT)
15.0000 mL | Freq: Two times a day (BID) | OROMUCOSAL | Status: DC
  Administered 2017-01-19 – 2017-01-22 (×7): 15 mL via OROMUCOSAL

## 2017-01-19 MED ORDER — MIDAZOLAM HCL 2 MG/2ML IJ SOLN
INTRAMUSCULAR | Status: AC
  Filled 2017-01-19: qty 2

## 2017-01-19 MED ORDER — SODIUM CHLORIDE 0.9 % IV SOLN
Freq: Once | INTRAVENOUS | Status: AC
  Administered 2017-01-19: 14:00:00 via INTRAVENOUS

## 2017-01-19 MED ORDER — SODIUM CHLORIDE 0.9 % IV SOLN: Freq: Once | INTRAVENOUS | Status: DC

## 2017-01-19 MED ORDER — ETOMIDATE 2 MG/ML IV SOLN
INTRAVENOUS | Status: AC
  Administered 2017-01-19: 20 mg via INTRAVENOUS
  Filled 2017-01-19: qty 10

## 2017-01-19 MED ORDER — EPINEPHRINE PF 1 MG/ML IJ SOLN
INTRAMUSCULAR | Status: AC
  Filled 2017-01-19: qty 1

## 2017-01-19 MED ORDER — LIDOCAINE HCL (PF) 1 % IJ SOLN
INTRAMUSCULAR | Status: AC
  Filled 2017-01-19: qty 30

## 2017-01-19 MED ORDER — FENTANYL 2500MCG IN NS 250ML (10MCG/ML) PREMIX INFUSION
0.0000 ug/h | INTRAVENOUS | Status: DC
  Administered 2017-01-19: 300 ug/h via INTRAVENOUS
  Administered 2017-01-19: 200 ug/h via INTRAVENOUS
  Administered 2017-01-20: 300 ug/h via INTRAVENOUS
  Administered 2017-01-20 – 2017-01-21 (×4): 400 ug/h via INTRAVENOUS
  Filled 2017-01-19 (×7): qty 250

## 2017-01-19 MED ORDER — MIDAZOLAM HCL 2 MG/2ML IJ SOLN
4.0000 mg | Freq: Once | INTRAMUSCULAR | Status: AC
  Administered 2017-01-19: 4 mg via INTRAVENOUS
  Filled 2017-01-19: qty 4

## 2017-01-19 MED ORDER — ETOMIDATE 2 MG/ML IV SOLN
0.3000 mg/kg | Freq: Once | INTRAVENOUS | Status: DC
  Administered 2017-01-19 (×2): 20 mg via INTRAVENOUS

## 2017-01-19 MED ORDER — ETOMIDATE 2 MG/ML IV SOLN
0.3000 mg/kg | Freq: Once | INTRAVENOUS | Status: DC
Start: 2017-01-19 — End: 2017-01-21

## 2017-01-19 MED ORDER — EPINEPHRINE PF 1 MG/10ML IJ SOSY
PREFILLED_SYRINGE | INTRAMUSCULAR | Status: AC
  Filled 2017-01-19: qty 10

## 2017-01-19 MED ORDER — ETOMIDATE 2 MG/ML IV SOLN
INTRAVENOUS | Status: AC
Start: 2017-01-19 — End: 2017-01-19
  Administered 2017-01-19: 20 mg via INTRAVENOUS
  Filled 2017-01-19: qty 10

## 2017-01-19 MED ORDER — FENTANYL CITRATE (PF) 100 MCG/2ML IJ SOLN
100.0000 ug | Freq: Once | INTRAMUSCULAR | Status: AC
  Administered 2017-01-19: 100 ug via INTRAVENOUS
  Filled 2017-01-19: qty 2

## 2017-01-19 MED ORDER — SODIUM CHLORIDE 0.9 % IJ SOLN
PREFILLED_SYRINGE | INTRAMUSCULAR | Status: DC | PRN
  Administered 2017-01-19: 10 mL

## 2017-01-19 MED ORDER — VITAMIN K1 10 MG/ML IJ SOLN
10.0000 mg | Freq: Once | INTRAVENOUS | Status: AC
  Administered 2017-01-19: 10 mg via INTRAVENOUS
  Filled 2017-01-19: qty 1

## 2017-01-19 MED ORDER — SODIUM CHLORIDE 0.9 % IV SOLN
Freq: Once | INTRAVENOUS | Status: AC
  Administered 2017-01-19: 05:00:00 via INTRAVENOUS

## 2017-01-19 MED ORDER — GUAIFENESIN-CODEINE 100-10 MG/5ML PO SOLN
10.0000 mL | ORAL | Status: DC | PRN
  Administered 2017-01-19 (×2): 10 mL via ORAL
  Filled 2017-01-19 (×2): qty 10

## 2017-01-19 MED ORDER — ORAL CARE MOUTH RINSE
15.0000 mL | OROMUCOSAL | Status: DC
  Administered 2017-01-19 – 2017-01-22 (×30): 15 mL via OROMUCOSAL

## 2017-01-19 NOTE — Progress Notes (Signed)
Took over care for patient around 91- Patient intubated for EGD procedure and central line placed.  Patient sedated with fentanyl drip - weaning off pressors as tolerated.  Patient vitals stable at this time.

## 2017-01-19 NOTE — Progress Notes (Signed)
I just spoke to the patient's ICU provider at this time to discuss patient's clinical status. He is still requiring PRBC transfusion due to low Hgb. His morning labs are still pending. Last INR was 2.4 and he has received FFPs since then. The ICU provider appropriately discussed the need for Intubation for any endoscopic procedures with the family. The patient and family is still discussing if they are ok with intubation. I have asked the ICU provider to page me if the family decides to proceed with intubation, as with the active bleeding, intubation would be needed to prevent aspiration and complete the procedure safely. In the meantime they will continue to correct INR, transfuse PRBC and medically resuscitate and optimize. The ICU provider and I discussed using [place] instead of FFP if INR is still high, and giving more Vitamin K as well. They will be rechecking labs and ordering the above as needed. If the family becomes agreeable to intubation, EGD can be planned for today. I did inform the ICU provider that complete hemostasis with an EGD in the setting of high INR might be limited and patient may need IR or surgical intervention. If patient's bleeding becomes worse, I would recommend obtaining IR or surgery evaluation well.

## 2017-01-19 NOTE — Progress Notes (Signed)
S: Called to the patient's bedside for heart rate in the 170s.  Briefly this is an 83 year old male with a history of prostate cancer with bone metastasis, admitted with sepsis secondary to healthcare acquired pneumonia, acute GI bleed and septic shock.  He is receiving blood pleural products for a hemoglobin level of 6.7 g/dL.  This evening, patient suddenly became tachycardic with heart rate in the 160s and 170s.  He is complaining of palpitations and worsening shortness of breath.  He has a history of atrial fibrillation and was on Coumadin however it was stopped due to the GI bleed.  He denies any chest pain but reports nausea and a nonproductive cough.  He had one large maroon colored stool this evening.  GI is following.   He remains hypotensive requiring pressors.  He is currently on 20 mcg of Neo-Synephrine.  He is also receiving IV fluids.  O: Constitutional: Appears acutely ill, in moderate distress HENT: PERRLA, conjunctivae pale, trachea midline Cardiovascular: Apical pulse irregular, S1-S2, no murmur regurg or gallop, +2 pulses bilaterally, +1 edema Pulmonary/Chest: Increased work of breathing, pursed lip breathing, bilateral breath sounds, diminished in the bases, mild expiratory wheezes in the right lung  fields Abdominal: Nondistended, obese, normal bowel sounds Musculoskeletal: Positive range of motion, no deformities Neurological: Alert to person and place, speech is normal, no focal deficits Skin: Pale, multiple ecchymotic areas in bilateral upper and lower extremities  Assessment A. fib with RVR Acute GI bleed- possibly combined lower and upper GI bleed; bloody stool x1 this evening Septic shock Acute hypoxic respiratory failure secondary to pneumonia HCAP Lactic acidosis  Plan Amiodarone bolus x2 followed by infusion Stat EKG reviewed IV fluid bolus Continue to transfuse blood products GI following- notified of bloody stool.  Recommended vitamin K and fresh frozen  plasma as well as Protonix. Trend CBC and transfuse as needed Keep n.p.o. Pressors to as needed to maintain mean arterial blood pressure greater than 65 Rest of the treatment plan remains unchanged  I discussed at length with patient and his daughter regarding his CODE STATUS.  Patient indicated that he had completed a DNR with his doctors at [facility].  His daughter indicated that he had a most form when he was in rehab in a nursing home.  After consultation with the family and with family confirmation that patient was a DNR I completed a new DNR order per patient's wishes.  His CODE STATUS has been updated to a DNR in the EMR.  Plan of care discussed with Dr. Jefferson Fuel and Dr. Jefferson Fuel updated on patient changes.  GI was also notified of all the changes patient had overnight.  Total time spent coordinating patient's care adjusting plan of care and communicating with family is 120 minutes  Yeraldi Fidler S. [facility] ANP-BC Pulmonary and Critical Care Medicine [facility] Pager [phone] or [phone]

## 2017-01-19 NOTE — Progress Notes (Signed)
Pulmonary/critical care  Procedure note Intubation:  Indication: For the purpose of procedure Medications given: Versed, fentanyl, etomidate please see nurses notes for full dosing Initially using a #4 MacIntosh blade the epiglottis all needed tip was able to be visualized. Very difficult to visualize the arytenoid cartilages and 0 visualization of the vocal cords Switch to a glide scope however extremely anterior wall was able to visualize the cords difficult to maneuver the endotracheal tube that for anterior. Switched back to the #4 MacIntosh blade initial attempt was unsuccessful resulting in inadvertent esophageal intubation Glide scope again utilized with significant anterior compression was able to maneuver the endotracheal tube and to location End-tidal CO2 noted Bilateral breath sounds Condensation endotracheal tube Patient is an extremely difficult intubation pending step portable chest x-ray  Hermelinda Dellen, D.O.

## 2017-01-19 NOTE — Progress Notes (Signed)
[facility] Progress Note Patient Name: Chad Kirby DOB: Mar 12, 1929 MRN: [id number]   Date of Service  01/04/2017  HPI/Events of Note  Hemoglobin continues to trend downward. Remains on vasopressor infusion.   eICU Interventions  1. Transfuse 3 units packed red blood cells stat 2. CBC & coags posttransfusion ordered      Intervention Category Major Interventions: Hemorrhage - evaluation and management  Tera Partridge 01/10/2017, 4:22 AM

## 2017-01-19 NOTE — Progress Notes (Signed)
[facility] [place] NAME: Chad Kirby    MR#:  [id number]  DATE OF BIRTH:  1929-12-22  SUBJECTIVE: admitted to ICU yesterday for shortness of breath.  To have septic shock, acute blood loss anemia.  No currently intubated for EGD.  Patient getting bedside EGD.  Started on Neo-Synephrine drip, amiodarone drip.  CHIEF COMPLAINT:   Chief Complaint  Patient presents with  . Shortness of Breath  . Rectal Bleeding    REVIEW OF SYSTEMS: unble to obtain review of systems   :  intubated, sedated.  DRUG ALLERGIES:   Allergies  Allergen Reactions  . Latex Rash    VITALS:  Blood pressure 119/67, pulse (!) 127, temperature 98.4 F (36.9 C), temperature source Oral, resp. rate (!) 24, weight 109.6 kg (241 lb 10 oz), SpO2 100 %.  PHYSICAL EXAMINATION:  GENERAL:  83 y.o.-year-old patient lying in the bed with  intubated, sedated, appears really pale. EYES: Pupils equal, round, HEENT: Head atraumatic, normocephalic.  Orally intubated.Marland Kitchen  NECK:  Supple, no jugular venous distention. No thyroid enlargement,  LUNGS: Normal breath sounds bilaterally, no wheezing, rales,rhonchi or crepitation. No use of accessory muscles of respiration.  CARDIOVASCULAR: S1, S2 normal. No murmurs, rubs, or gallops.  ABDOMEN: Soft, nontender, nondistended. Bowel sounds present. No organomegaly or mass.  EXTREMITIES: No pedal edema, cyanosis, or clubbing.  NEUROLOGIC: intubated/, sedated. PSYCHIATRIC: The patient is alert and oriented x 3.  SKIN: No obvious rash, lesion, or ulcer.    LABORATORY PANEL:   CBC Recent Labs  Lab 01/26/2017 0759  WBC 10.0  HGB 6.6*  HCT 20.0*  PLT 81*   ------------------------------------------------------------------------------------------------------------------  Chemistries  Recent Labs  Lab 01/10/2017 0452  01/07/2017 0759  NA 138   < > 136  K 4.6   < > 4.1  CL 106   < > 111  CO2 24   < > 17*  GLUCOSE 149*   < >  201*  BUN 17   < > 26*  CREATININE 1.15   < > 1.07  CALCIUM 7.7*   < > 6.7*  MG  --    < > 2.4  AST 35  --   --   ALT 14*  --   --   ALKPHOS 179*  --   --   BILITOT 0.8  --   --    < > = values in this interval not displayed.   ------------------------------------------------------------------------------------------------------------------  Cardiac Enzymes Recent Labs  Lab 01/22/2017 0759  TROPONINI <0.03   ------------------------------------------------------------------------------------------------------------------  RADIOLOGY:  Ct Abdomen Pelvis Wo Contrast  Result Date: 01/20/2017 CLINICAL DATA:  Abdominal distention and low hemoglobin. History of prostate cancer. EXAM: CT ABDOMEN AND PELVIS WITHOUT CONTRAST TECHNIQUE: Multidetector CT imaging of the abdomen and pelvis was performed following the standard protocol without IV contrast. COMPARISON:  CTA chest 01/24/2017 CT abdomen pelvis 05/04/2015 FINDINGS: Lower chest: Small bilateral pleural effusions with associated atelectasis. Cardiomegaly. Hepatobiliary: Normal hepatic contours and density. No visible biliary dilatation. Normal gallbladder. Pancreas: Mild pancreatic atrophy, likely senescent. Spleen: Normal. Adrenals/Urinary Tract: --Adrenal glands: Normal. --Right kidney/ureter: Hyperdensities in multiple areas of the collecting system, favored to be due to recent IV contrast administration. --Left kidney/ureter: 7 mm left upper pole calculus. The other hyperdensities in the collecting system are likely due to recent contrast administration. --Urinary bladder: Opacification of the urinary bladder secondary to recent contrast administration for CTA chest. Stomach/Bowel: --Stomach/Duodenum: Patulous lower esophagus. Normal stomach and duodenum. --Small  bowel: No dilatation or inflammation. --Colon: No focal abnormality. --Appendix: Not visualized. No right lower quadrant inflammation or free fluid. Vascular/Lymphatic: There is  atherosclerotic calcification of the non aneurysmal abdominal aorta. There is also calcification of the major branch vessels of the aorta. Normal course and caliber of the major abdominal vessels. No abdominal or pelvic lymphadenopathy. Reproductive: Normal size of the prostate gland with multiple calcifications in the left peripheral zone. Musculoskeletal. Extensive sclerotic metastatic disease of the thoracolumbar spine and pelvis, progressed relative to the study of 05/04/2015. No pathologic fracture or bony spinal canal stenosis. There is moderate to severe neural foraminal stenosis at L4-5 and L5-S1. There also multiple sclerotic metastases within the visible portions of the ribs. Other: None. IMPRESSION: 1. No acute abdominopelvic abnormality. 2. Small bilateral pleural effusions and moderate cardiomegaly. 3. Severe, extensive sclerotic metastatic disease of the pelvis, ribs and thoracolumbar spine, progressed relative to 05/04/2015. No pathologic fracture. 4.  Aortic Atherosclerosis (ICD10-I70.0). 5. Hyperdensities within both renal collecting systems and urinary bladder are secondary to recent IV contrast administration for CTA chest. Electronically Signed   By: Ulyses Jarred M.D.   On: 01/23/2017 13:57   Dg Abd 1 View  Result Date: 01/10/2017 CLINICAL DATA:  OG tube placement EXAM: ABDOMEN - 1 VIEW COMPARISON:  Chest x-ray performed today.  Plain films 01/13/2017 FINDINGS: Significant gaseous distention of the stomach. OG tube tip is in the upper abdomen near the GE junction. This could be advanced several cm. IMPRESSION: OG tube tip near the GE junction. Significant gaseous distention of the stomach. Electronically Signed   By: Rolm Baptise M.D.   On: 01/16/2017 12:06   Dg Abd 1 View  Result Date: 01/03/2017 CLINICAL DATA:  Nausea and vomiting EXAM: ABDOMEN - 1 VIEW COMPARISON:  CT abdomen pelvis 01/28/2017 FINDINGS: There is diffuse sclerotic metastatic disease. The bladder is opacified by  excreted contrast material. Mildly dilated small bowel in the left mid abdomen but no other evidence of small-bowel obstruction. IMPRESSION: Diffuse sclerotic metastatic disease. No radiographic evidence of small-bowel obstruction. Electronically Signed   By: Ulyses Jarred M.D.   On: 01/21/2017 21:51   Ct Angio Chest Pe W And/or Wo Contrast  Result Date: 01/13/2017 CLINICAL DATA:  Acute onset of difficulty breathing. Current history of prostate cancer. Hematochezia. EXAM: CT ANGIOGRAPHY CHEST WITH CONTRAST TECHNIQUE: Multidetector CT imaging of the chest was performed using the standard protocol during bolus administration of intravenous contrast. Multiplanar CT image reconstructions and MIPs were obtained to evaluate the vascular anatomy. CONTRAST:  52mL ISOVUE-370 IOPAMIDOL (ISOVUE-370) INJECTION 76% COMPARISON:  Chest radiograph performed earlier today at 4:50 a.m., and CT of the chest performed 06/19/2004 FINDINGS: Cardiovascular: There is no evidence of pulmonary embolus. Evaluation is suboptimal in areas of airspace consolidation and scarring. The heart is mildly enlarged. Diffuse coronary artery calcifications are seen. Scattered calcification is noted along the thoracic aorta and proximal great vessels. Mediastinum/Nodes: The mediastinum is grossly unremarkable in appearance. No mediastinal lymphadenopathy is seen. No pericardial effusion is identified. The thyroid gland is unremarkable. No axillary lymphadenopathy is appreciated. Lungs/Pleura: Trace bilateral pleural effusions are noted. Mild left basilar airspace opacity likely reflects atelectasis, though pneumonia might have a similar appearance. No pneumothorax is seen. No masses are identified. A few blebs are noted at the right lung apex. Upper Abdomen: The visualized portions of the liver and spleen are unremarkable. The visualized portions of the adrenal glands are within normal limits. Musculoskeletal: Diffuse sclerosis throughout the  visualized osseous structures reflects  metastatic disease from the patient's prostate cancer. The visualized musculature is unremarkable in appearance. Review of the MIP images confirms the above findings. IMPRESSION: 1. No evidence of pulmonary embolus. 2. Trace bilateral pleural effusions. Mild left basilar airspace opacity likely reflects atelectasis, though pneumonia might have a similar appearance. 3. Diffuse sclerosis throughout the visualized osseous structures reflects metastatic disease from the patient's prostate cancer. 4. Diffuse coronary artery calcifications seen.  Mild cardiomegaly. Electronically Signed   By: Garald Balding M.D.   On: 01/05/2017 06:55   Dg Chest Port 1 View  Result Date: 01/16/2017 CLINICAL DATA:  ET tube and central line placement EXAM: PORTABLE CHEST 1 VIEW COMPARISON:  01/30/2017 FINDINGS: Endotracheal tube is approximately 4.5 cm above the carina. Left central line tip in the upper SVC. No pneumothorax. NG tube tip near the GE junction with gaseous distention of the stomach. Cardiomegaly. Aortic calcifications. Elevation of the left hemidiaphragm with left base atelectasis. Mild vascular congestion. IMPRESSION: Support devices as above. NG tube tip is near the GE junction and could be advanced slightly. Gaseous distention of the stomach. Cardiomegaly, vascular congestion. Elevation of the left hemidiaphragm with left base atelectasis. Electronically Signed   By: Rolm Baptise M.D.   On: 01/10/2017 12:05   Dg Chest Port 1 View  Result Date: 01/03/2017 CLINICAL DATA:  Shortness of breath. EXAM: PORTABLE CHEST 1 VIEW COMPARISON:  01/20/2017 FINDINGS: Examination limited by significant overlying artifact. The heart is enlarged but stable. Stable tortuosity, ectasia and calcification of the thoracic aorta. Stable eventration of the left hemidiaphragm. No definite infiltrates or effusions. Streaky bibasilar atelectasis. Diffuse sclerotic metastatic bone disease. IMPRESSION: 1.  Cardiac enlargement but no definite pulmonary edema. 2. Bibasilar atelectasis. Electronically Signed   By: Marijo Sanes M.D.   On: 01/04/2017 09:16   Dg Chest Port 1 View  Result Date: 01/29/2017 CLINICAL DATA:  Acute onset of shortness of breath and cough. EXAM: PORTABLE CHEST 1 VIEW COMPARISON:  Chest radiograph performed 01/04/2017 FINDINGS: There is elevation of the left hemidiaphragm. Mild right basilar airspace opacity may reflect pneumonia. No significant pleural effusion or pneumothorax is seen. The cardiomediastinal silhouette is borderline normal in size. No acute osseous abnormalities are seen. IMPRESSION: Elevation of the left hemidiaphragm. Mild right basilar airspace opacity may reflect pneumonia. Electronically Signed   By: Garald Balding M.D.   On: 01/10/2017 05:27    EKG:   Orders placed or performed during the hospital encounter of 01/26/2017  . ED EKG 12-Lead  . ED EKG 12-Lead  . EKG 12-Lead  . EKG 12-Lead  . EKG 12-Lead  . EKG 12-Lead    ASSESSMENT AND PLAN:  Acute respiratory failure secondary to COPD exacerbation: 2.  Septic shock due to UTI, healthcare associated pneumonia: Continue vancomycin, Zosyn, IV fluids, pressors 3.  Metastatic prostate cancer: Patient has diffuse skeletal metastases.  Getting treatment at [facility]. 4.  GI bleeding of unknown origin, getting bedside EGD now, patient got 2 units of transfusion but still hemoglobin Is low at 6.6.  Hemoglobin was 5.5 yesterday, 86.6 today.  High risk for prolonged intubation because of multiple medical problems but patient condition discussed with family, intubated.  Management as per ICU team. #4. atrial fibrillation with RVR: On amiodarone drip.  Stopped anticoagulation due to GI bleed, patient received FFP, vitamin K, packed RBC transfusion.  INR today is 1.46. Prognosis really poor.  All the records are reviewed and case discussed with Care Management/Social Workerr. Management plans discussed with the patient,  family  and they are in agreement.  CODE STATUS: DNR  TOTAL TIME TAKING CARE OF THIS PATIENT: 35 minutes.   Critically ill at this time unable to plan discharge date.  Epifanio Lesches M.D on 01/28/2017 at 12:23 PM  Between 7am to 6pm - Pager - [phone]  After 6pm go to [email or web address] - password [email or web address] [facility]  Office  [phone]  CC: Primary care physician; [facility]   Note: This dictation was prepared with Dragon dictation along with smaller phrase technology. Any transcriptional errors that result from this process are unintentional.

## 2017-01-19 NOTE — Progress Notes (Signed)
[facility] Progess Note    ASSESSMENT/PLAN   Gastrointestinal bleeding. Patient with known duodenal ulcer, continues to have progressive blood loss, discussed with family, patient is a DO NOT RESUSCITATE but they wish to proceed with procedure. Will intubate patient, contact gastroenterology and schedule endoscopy. Presently receiving transfusion, has received 1 unit with a hemoglobin of 6.6, pending 3 more. Has received multiple transfusions for fresh frozen plasma his last INR was 1.46. We'll continue vitamin K  Probable sepsis. Etiology of hypotension his blood loss anemia, will continue to empirically cover with vancomycin and Zosyn  History of prostate cancer. Being treated at [facility] fibrillation. With rapid ventricular response, presently on amiodarone   Name: Chad Kirby MRN: [id number] DOB: Jul 18, 1929     SUBJECTIVE:   Patient has required significant amount of blood products overnight. Hemoglobin continually decrease with this morning's value Of 5.5, presently being transfused, he is received multiple doses of fresh frozen plasma along with vitamin K. Appreciate gastroenterology's input. Discussed with family this morning patient will require intubation for any endoscopic procedures. Family wishes that to occur will proceed  VITAL SIGNS: Temp:  [98 F (36.7 C)-98.8 F (37.1 C)] 98 F (36.7 C) (11/17 0830) Pulse Rate:  [89-155] 114 (11/17 0845) Resp:  [13-38] 14 (11/17 0845) BP: (79-125)/(40-94) 122/55 (11/17 0755) SpO2:  [89 %-100 %] 100 % (11/17 0845)  PHYSICAL EXAMINATION: Physical Examination:   VS: BP (!) 122/55   Pulse (!) 114   Temp 98 F (36.7 C) (Oral)   Resp 14   SpO2 100%    Neuro: Patient somnolent but arousable  Pulmonary: Tachypnea at 25, coarse rhonchi appreciated Cardiovascular atrial fibrillation with rapid ventricular response at 110 Abdomen: Distended abdomen tympanic with hypoactive bowel  sounds. Skin:   warm, no rashes, no ecchymosis  Extremities: normal, no cyanosis, clubbing.    LABORATORY PANEL:   CBC Recent Labs  Lab 01/07/2017 0759  WBC 10.0  HGB 6.6*  HCT 20.0*  PLT 81*    Chemistries  Recent Labs  Lab 01/23/2017 0452  01/15/2017 0759  NA 138   < > 136  K 4.6   < > 4.1  CL 106   < > 111  CO2 24   < > 17*  GLUCOSE 149*   < > 201*  BUN 17   < > 26*  CREATININE 1.15   < > 1.07  CALCIUM 7.7*   < > 6.7*  MG  --    < > 2.4  PHOS  --    < > 2.7  AST 35  --   --   ALT 14*  --   --   ALKPHOS 179*  --   --   BILITOT 0.8  --   --    < > = values in this interval not displayed.    Recent Labs  Lab 01/08/2017 0757  GLUCAP 116*   No results for input(s): PHART, PCO2ART, PO2ART in the last 168 hours. Recent Labs  Lab 01/05/2017 0452  AST 35  ALT 14*  ALKPHOS 179*  BILITOT 0.8  ALBUMIN 2.6*    Cardiac Enzymes Recent Labs  Lab 01/06/2017 0759  TROPONINI <0.03    RADIOLOGY:  Ct Abdomen Pelvis Wo Contrast  Result Date: 01/06/2017 CLINICAL DATA:  Abdominal distention and low hemoglobin. History of prostate cancer. EXAM: CT ABDOMEN AND PELVIS WITHOUT CONTRAST TECHNIQUE: Multidetector CT imaging of the abdomen and pelvis was performed following the standard protocol  without IV contrast. COMPARISON:  CTA chest 01/05/2017 CT abdomen pelvis 05/04/2015 FINDINGS: Lower chest: Small bilateral pleural effusions with associated atelectasis. Cardiomegaly. Hepatobiliary: Normal hepatic contours and density. No visible biliary dilatation. Normal gallbladder. Pancreas: Mild pancreatic atrophy, likely senescent. Spleen: Normal. Adrenals/Urinary Tract: --Adrenal glands: Normal. --Right kidney/ureter: Hyperdensities in multiple areas of the collecting system, favored to be due to recent IV contrast administration. --Left kidney/ureter: 7 mm left upper pole calculus. The other hyperdensities in the collecting system are likely due to recent contrast administration. --Urinary  bladder: Opacification of the urinary bladder secondary to recent contrast administration for CTA chest. Stomach/Bowel: --Stomach/Duodenum: Patulous lower esophagus. Normal stomach and duodenum. --Small bowel: No dilatation or inflammation. --Colon: No focal abnormality. --Appendix: Not visualized. No right lower quadrant inflammation or free fluid. Vascular/Lymphatic: There is atherosclerotic calcification of the non aneurysmal abdominal aorta. There is also calcification of the major branch vessels of the aorta. Normal course and caliber of the major abdominal vessels. No abdominal or pelvic lymphadenopathy. Reproductive: Normal size of the prostate gland with multiple calcifications in the left peripheral zone. Musculoskeletal. Extensive sclerotic metastatic disease of the thoracolumbar spine and pelvis, progressed relative to the study of 05/04/2015. No pathologic fracture or bony spinal canal stenosis. There is moderate to severe neural foraminal stenosis at L4-5 and L5-S1. There also multiple sclerotic metastases within the visible portions of the ribs. Other: None. IMPRESSION: 1. No acute abdominopelvic abnormality. 2. Small bilateral pleural effusions and moderate cardiomegaly. 3. Severe, extensive sclerotic metastatic disease of the pelvis, ribs and thoracolumbar spine, progressed relative to 05/04/2015. No pathologic fracture. 4.  Aortic Atherosclerosis (ICD10-I70.0). 5. Hyperdensities within both renal collecting systems and urinary bladder are secondary to recent IV contrast administration for CTA chest. Electronically Signed   By: Ulyses Jarred M.D.   On: 01/13/2017 13:57   Dg Abd 1 View  Result Date: 01/10/2017 CLINICAL DATA:  Nausea and vomiting EXAM: ABDOMEN - 1 VIEW COMPARISON:  CT abdomen pelvis 01/14/2017 FINDINGS: There is diffuse sclerotic metastatic disease. The bladder is opacified by excreted contrast material. Mildly dilated small bowel in the left mid abdomen but no other evidence of  small-bowel obstruction. IMPRESSION: Diffuse sclerotic metastatic disease. No radiographic evidence of small-bowel obstruction. Electronically Signed   By: Ulyses Jarred M.D.   On: 01/23/2017 21:51   Ct Angio Chest Pe W And/or Wo Contrast  Result Date: 01/10/2017 CLINICAL DATA:  Acute onset of difficulty breathing. Current history of prostate cancer. Hematochezia. EXAM: CT ANGIOGRAPHY CHEST WITH CONTRAST TECHNIQUE: Multidetector CT imaging of the chest was performed using the standard protocol during bolus administration of intravenous contrast. Multiplanar CT image reconstructions and MIPs were obtained to evaluate the vascular anatomy. CONTRAST:  22mL ISOVUE-370 IOPAMIDOL (ISOVUE-370) INJECTION 76% COMPARISON:  Chest radiograph performed earlier today at 4:50 a.m., and CT of the chest performed 06/19/2004 FINDINGS: Cardiovascular: There is no evidence of pulmonary embolus. Evaluation is suboptimal in areas of airspace consolidation and scarring. The heart is mildly enlarged. Diffuse coronary artery calcifications are seen. Scattered calcification is noted along the thoracic aorta and proximal great vessels. Mediastinum/Nodes: The mediastinum is grossly unremarkable in appearance. No mediastinal lymphadenopathy is seen. No pericardial effusion is identified. The thyroid gland is unremarkable. No axillary lymphadenopathy is appreciated. Lungs/Pleura: Trace bilateral pleural effusions are noted. Mild left basilar airspace opacity likely reflects atelectasis, though pneumonia might have a similar appearance. No pneumothorax is seen. No masses are identified. A few blebs are noted at the right lung apex. Upper Abdomen: The  visualized portions of the liver and spleen are unremarkable. The visualized portions of the adrenal glands are within normal limits. Musculoskeletal: Diffuse sclerosis throughout the visualized osseous structures reflects metastatic disease from the patient's prostate cancer. The visualized  musculature is unremarkable in appearance. Review of the MIP images confirms the above findings. IMPRESSION: 1. No evidence of pulmonary embolus. 2. Trace bilateral pleural effusions. Mild left basilar airspace opacity likely reflects atelectasis, though pneumonia might have a similar appearance. 3. Diffuse sclerosis throughout the visualized osseous structures reflects metastatic disease from the patient's prostate cancer. 4. Diffuse coronary artery calcifications seen.  Mild cardiomegaly. Electronically Signed   By: Garald Balding M.D.   On: 01/04/2017 06:55   Dg Chest Port 1 View  Result Date: 01/11/2017 CLINICAL DATA:  Shortness of breath. EXAM: PORTABLE CHEST 1 VIEW COMPARISON:  01/17/2017 FINDINGS: Examination limited by significant overlying artifact. The heart is enlarged but stable. Stable tortuosity, ectasia and calcification of the thoracic aorta. Stable eventration of the left hemidiaphragm. No definite infiltrates or effusions. Streaky bibasilar atelectasis. Diffuse sclerotic metastatic bone disease. IMPRESSION: 1. Cardiac enlargement but no definite pulmonary edema. 2. Bibasilar atelectasis. Electronically Signed   By: Marijo Sanes M.D.   On: 01/10/2017 09:16   Dg Chest Port 1 View  Result Date: 01/22/2017 CLINICAL DATA:  Acute onset of shortness of breath and cough. EXAM: PORTABLE CHEST 1 VIEW COMPARISON:  Chest radiograph performed 01/04/2017 FINDINGS: There is elevation of the left hemidiaphragm. Mild right basilar airspace opacity may reflect pneumonia. No significant pleural effusion or pneumothorax is seen. The cardiomediastinal silhouette is borderline normal in size. No acute osseous abnormalities are seen. IMPRESSION: Elevation of the left hemidiaphragm. Mild right basilar airspace opacity may reflect pneumonia. Electronically Signed   By: Garald Balding M.D.   On: 01/29/2017 05:27    Hermelinda Dellen, DO

## 2017-01-19 NOTE — Progress Notes (Signed)
Pulmonary/conical care  Procedure note Central line placement Indication: Inadequate access with borderline blood pressures Left internal jugular approach Ultrasound guidance utilized Identified a widely distended left internal jugular vein Complete contact barrier precautions utilized Topical anesthetic applied The left internal jugular vein was cannulated without difficulty on first pass Using the Seldinger technique a triple-lumen catheter was placed The guidewire was removed in total and intact All 3 ports had dark nonpulsatile venous return, flushed Sutured in Dressed by nurse Stat portable chest x-ray pending  Hermelinda Dellen, D.O.

## 2017-01-19 NOTE — Progress Notes (Signed)
Per GI MD- patient having active bleed during Bedside EGD- Dr. Jefferson Fuel ordered emergent blood and plasma- patient went to IR to have embolization performed.

## 2017-01-19 NOTE — Op Note (Signed)
[facility] Patient Name: Chad Kirby Procedure Date: 01/30/2017 12:14 PM MRN: [id number] Account #: [id number] Date of Birth: 03-11-1929 Admit Type: Inpatient Age: 83 Room: [facility] ENDO ROOM 4 Gender: Male Note Status: Finalized Procedure:            Upper GI endoscopy Indications:          Hematochezia Providers:            Maaz Spiering B. Bonna Gains MD, MD Medicines:            General Anesthesia Complications:        No immediate complications. Procedure:            Pre-Anesthesia Assessment:                       - The risks and benefits of the procedure and the                        sedation options and risks were discussed with the                        patient. All questions were answered and informed                        consent was obtained.                       - Patient identification and proposed procedure were                        verified prior to the procedure by the physician, the                        nurse and the technician. The procedure was verified in                        the procedure room.                       - ASA Grade Assessment: IV - A patient with severe                        systemic disease that is a constant threat to life.                       After obtaining informed consent, the endoscope was                        passed under direct vision. Throughout the procedure,                        the patient's blood pressure, pulse, and oxygen                        saturations were monitored continuously. The Endoscope                        was introduced through the mouth, and advanced to the                        duodenal bulb. The upper GI  endoscopy was accomplished                        with ease. The patient tolerated the procedure well. Findings:      The examined esophagus was normal.      Clotted blood was found in the gastric fundus.      One cratered duodenal ulcer with a visible vessel was  found in the       duodenal bulb. The lesion was 3 mm in largest dimension. For hemostasis,       four hemostatic clips were successfully placed. Area was successfully       injected with of a 1:10,000 solution of epinephrine for hemostasis.       Coagulation for hemostasis using bipolar probe was successful. Impression:           - Normal esophagus.                       - Old Clotted blood in the gastric fundus. No bleeding                        lesions in the stomach. The stomach mucosa underneath                        the clot could not be visualized                       - One duodenal ulcer with a visible vessel. Clips were                        placed. Injected. Treated with bipolar cautery.                       - No specimens collected. Recommendation:       - Return patient to ICU for ongoing care.                       - NPO.                       - Use Protonix (pantoprazole) 40 mg IV BID.                       - IR consult at this time for embolization, as large                        cratered ulcer with deep large visible vessel is at                        high risk of rebleeding.                       - The findings and recommendations were discussed with                        the patient's family.                       - The findings and recommendations were discussed with  the patient's primary physician.                       - Continue to hold Coumadin. Risks and benefits of                        resuming anticoagulation after complete resolution of                        bleeding in this high risk patient will need to be                        weighed with cardiology and patient's family carefully.                       -Continue Serial CBCs and transfuse PRN Procedure Code(s):    --- Professional ---                       [phone], Esophagogastroduodenoscopy, flexible, transoral;                        with control of bleeding, any  method Diagnosis Code(s):    --- Professional ---                       K92.2, Gastrointestinal hemorrhage, unspecified                       K26.4, Chronic or unspecified duodenal ulcer with                        hemorrhage                       K92.1, Melena (includes Hematochezia) CPT copyright 2016 [employer]. All rights reserved. The codes documented in this report are preliminary and upon coder review may  be revised to meet current compliance requirements.  [name], MD Margretta Sidle B. Bonna Gains MD, MD 01/11/2017 4:48:41 PM This report has been signed electronically. Number of Addenda: 0 Note Initiated On: 01/31/2017 12:14 PM      [facility]

## 2017-01-19 NOTE — Procedures (Signed)
  Procedure: Mesenteric arteriogram, GDA embolization   Preprocedure diagnosis: GI bleed Postprocedure diagnosis: same EBL:   minimal Complications:  none immediate  See full dictation in [facility].  Dillard Cannon MD Main # [phone] Pager  [phone]

## 2017-01-20 ENCOUNTER — Inpatient Hospital Stay: Payer: Medicare Other

## 2017-01-20 DIAGNOSIS — D62 Acute posthemorrhagic anemia: Secondary | ICD-10-CM

## 2017-01-20 DIAGNOSIS — J9601 Acute respiratory failure with hypoxia: Secondary | ICD-10-CM

## 2017-01-20 DIAGNOSIS — K269 Duodenal ulcer, unspecified as acute or chronic, without hemorrhage or perforation: Secondary | ICD-10-CM

## 2017-01-20 LAB — TYPE AND SCREEN
ABO/RH(D): A POS
Antibody Screen: NEGATIVE
UNIT DIVISION: 0
UNIT DIVISION: 0
UNIT DIVISION: 0
UNIT DIVISION: 0
UNIT DIVISION: 0
UNIT DIVISION: 0
Unit division: 0
Unit division: 0
Unit division: 0
Unit division: 0
Unit division: 0

## 2017-01-20 LAB — PREPARE FRESH FROZEN PLASMA
UNIT DIVISION: 0
UNIT DIVISION: 0
Unit division: 0
Unit division: 0

## 2017-01-20 LAB — BPAM FFP
BLOOD PRODUCT EXPIRATION DATE: 201811212359
BLOOD PRODUCT EXPIRATION DATE: 201811212359
Blood Product Expiration Date: 201811192359
Blood Product Expiration Date: 201811222359
ISSUE DATE / TIME: 201811161608
ISSUE DATE / TIME: 201811161742
ISSUE DATE / TIME: 201811162226
ISSUE DATE / TIME: 201811170237
UNIT TYPE AND RH: 6200
UNIT TYPE AND RH: 6200
Unit Type and Rh: 6200
Unit Type and Rh: 6200

## 2017-01-20 LAB — BLOOD GAS, ARTERIAL
Acid-base deficit: 7 mmol/L — ABNORMAL HIGH (ref 0.0–2.0)
Bicarbonate: 18.6 mmol/L — ABNORMAL LOW (ref 20.0–28.0)
FIO2: 0.8
LHR: 24 {breaths}/min
MECHVT: 500 mL
O2 SAT: 90.6 %
PATIENT TEMPERATURE: 37
PCO2 ART: 37 mmHg (ref 32.0–48.0)
PEEP: 8 cmH2O
PO2 ART: 66 mmHg — AB (ref 83.0–108.0)
pH, Arterial: 7.31 — ABNORMAL LOW (ref 7.350–7.450)

## 2017-01-20 LAB — CBC
HEMATOCRIT: 32.7 % — AB (ref 40.0–52.0)
Hemoglobin: 11.1 g/dL — ABNORMAL LOW (ref 13.0–18.0)
MCH: 30.4 pg (ref 26.0–34.0)
MCHC: 34 g/dL (ref 32.0–36.0)
MCV: 89.4 fL (ref 80.0–100.0)
Platelets: 45 10*3/uL — ABNORMAL LOW (ref 150–440)
RBC: 3.65 MIL/uL — ABNORMAL LOW (ref 4.40–5.90)
RDW: 15.9 % — AB (ref 11.5–14.5)
WBC: 10.7 10*3/uL — ABNORMAL HIGH (ref 3.8–10.6)

## 2017-01-20 LAB — BPAM RBC
BLOOD PRODUCT EXPIRATION DATE: 201812052359
BLOOD PRODUCT EXPIRATION DATE: 201812052359
BLOOD PRODUCT EXPIRATION DATE: 201812062359
BLOOD PRODUCT EXPIRATION DATE: 201812062359
BLOOD PRODUCT EXPIRATION DATE: 201812072359
BLOOD PRODUCT EXPIRATION DATE: 201812072359
Blood Product Expiration Date: 201812052359
Blood Product Expiration Date: 201812052359
Blood Product Expiration Date: 201812062359
Blood Product Expiration Date: 201812062359
Blood Product Expiration Date: 201812062359
ISSUE DATE / TIME: 201811161337
ISSUE DATE / TIME: 201811170824
ISSUE DATE / TIME: 201811171317
ISSUE DATE / TIME: 201811171403
ISSUE DATE / TIME: 201811171416
ISSUE DATE / TIME: 201811171725
ISSUE DATE / TIME: 201811162016
ISSUE DATE / TIME: 201811170518
ISSUE DATE / TIME: 201811171514
ISSUE DATE / TIME: 201811171956
UNIT TYPE AND RH: 6200
UNIT TYPE AND RH: 6200
UNIT TYPE AND RH: 6200
Unit Type and Rh: 6200
Unit Type and Rh: 6200
Unit Type and Rh: 6200
Unit Type and Rh: 6200
Unit Type and Rh: 6200
Unit Type and Rh: 6200
Unit Type and Rh: 6200
Unit Type and Rh: 6200

## 2017-01-20 LAB — PROCALCITONIN: PROCALCITONIN: 0.39 ng/mL

## 2017-01-20 LAB — PHOSPHORUS: Phosphorus: 2.5 mg/dL (ref 2.5–4.6)

## 2017-01-20 LAB — MAGNESIUM: Magnesium: 2.1 mg/dL (ref 1.7–2.4)

## 2017-01-20 LAB — LACTIC ACID, PLASMA: Lactic Acid, Venous: 2.5 mmol/L (ref 0.5–1.9)

## 2017-01-20 MED ORDER — SODIUM CHLORIDE 0.9 % IV SOLN: Freq: Once | INTRAVENOUS | Status: DC

## 2017-01-20 MED ORDER — MIDAZOLAM HCL 2 MG/2ML IJ SOLN
2.0000 mg | INTRAMUSCULAR | Status: DC | PRN
  Administered 2017-01-20 (×3): 4 mg via INTRAVENOUS
  Administered 2017-01-20: 2 mg via INTRAVENOUS
  Administered 2017-01-20 – 2017-01-21 (×4): 4 mg via INTRAVENOUS
  Administered 2017-01-21: 2 mg via INTRAVENOUS
  Administered 2017-01-22: 4 mg via INTRAVENOUS
  Filled 2017-01-20 (×9): qty 4

## 2017-01-20 MED ORDER — SODIUM BICARBONATE 8.4 % IV SOLN
150.0000 meq | Freq: Once | INTRAVENOUS | Status: AC
  Administered 2017-01-20: 150 meq via INTRAVENOUS
  Filled 2017-01-20: qty 150

## 2017-01-20 MED ORDER — MIDAZOLAM HCL 2 MG/2ML IJ SOLN
INTRAMUSCULAR | Status: AC
  Filled 2017-01-20: qty 2

## 2017-01-20 NOTE — Progress Notes (Signed)
Chad Antigua, MD [address] [place], [place], [place], [address] [address], [place], [place], [place], [address] Phone: [phone]  Fax: [phone] Progress note    Date of Admission:  01/23/2017 Date of Consultation:  01/20/2017         HPI:   Chad Kirby is a 83 y.o. male status post EGD and embolization yesterday.  Patient is intubated, with no episodes of active GI bleeding since embolization.  Hemoglobin has been stable since embolization yesterday.  Past Medical History:  Diagnosis Date  . Atrial fibrillation (Chad Kirby)   . Cancer [facility])    Prostate with bone mets-current chemo treatment  . COPD (chronic obstructive pulmonary disease) (Chad Kirby)   . Gastric ulcer   . Hyperlipidemia     Past Surgical History:  Procedure Laterality Date  . COLONOSCOPY WITH PROPOFOL N/A 05/18/2015   Performed by Hulen Luster, MD at [place]  . ESOPHAGOGASTRODUODENOSCOPY (EGD) N/A 05/19/2015   Performed by Hulen Luster, MD at [place]  . ESOPHAGOGASTRODUODENOSCOPY (EGD) WITH PROPOFOL N/A 01/06/2017   Performed by Efrain Sella, MD at [facility]  . ESOPHAGOGASTRODUODENOSCOPY (EGD) WITH PROPOFOL N/A 06/02/2015   Performed by Lollie Sails, MD at [facility]  . REPLACEMENT TOTAL KNEE Right     Prior to Admission medications   Medication Sig Start Date End Date Taking? Authorizing Provider  atorvastatin (LIPITOR) 80 MG tablet Take 80 mg by mouth at bedtime.   Yes [provider]  budesonide-formoterol (SYMBICORT) 80-4.5 MCG/ACT inhaler Inhale 2 puffs into the lungs 2 (two) times daily.   Yes [provider]  docusate sodium (COLACE) 100 MG capsule Take 1 capsule (100 mg total) 2 (two) times daily as needed by mouth for mild constipation. 01/07/17  Yes Gouru, Illene Silver, MD  ferrous sulfate 325 (65 FE) MG EC tablet Take 1 tablet (325 mg total) 2 (two) times daily by mouth. 01/07/17 01/07/18 Yes Gouru, Illene Silver, MD  metoprolol succinate (TOPROL-XL) 25  MG 24 hr tablet Take 25 mg by mouth daily.   Yes [provider]  pantoprazole (PROTONIX) 40 MG tablet Take 1 tablet (40 mg total) 2 (two) times daily by mouth. 01/07/17  Yes Gouru, Illene Silver, MD  prochlorperazine (COMPAZINE) 10 MG tablet Take 10 mg by mouth every 6 (six) hours as needed. For nausea and vomiting. 04/14/15  Yes [provider]  senna-docusate (SENOKOT-S) 8.6-50 MG tablet Take 1-2 tablets 2 (two) times daily as needed by mouth for mild constipation.   Yes [provider]  tiotropium (SPIRIVA) 18 MCG inhalation capsule Place 18 mcg into inhaler and inhale daily.   Yes [provider]  warfarin (COUMADIN) 3 MG tablet Take 1 tablet (3 mg total) daily by mouth. 01/07/17  Yes Gouru, Illene Silver, MD  digoxin (LANOXIN) 0.25 MG tablet Take 1 tablet (0.25 mg total) by mouth daily. Patient not taking: Reported on 01/15/2017 06/10/15   Chad Sane, MD  feeding supplement, ENSURE ENLIVE, (ENSURE ENLIVE) LIQD Take 237 mLs by mouth 3 (three) times daily with meals. 06/10/15   Chad Sane, MD  gabapentin (NEURONTIN) 300 MG capsule Take 1 capsule (300 mg total) by mouth 3 (three) times daily. Patient not taking: Reported on 01/04/2017 06/10/15   Chad Sane, MD  polyethylene glycol (MIRALAX / GLYCOLAX) packet Take 17 g by mouth 2 (two) times daily. Patient not taking: Reported on 01/24/2017 05/06/15   Chad Mango, MD    Family History  Problem Relation Age of Onset  .  Brain cancer Chad Kirby   . Stroke Chad Kirby      Social History   Tobacco Use  . Smoking status: Former Research scientist (life sciences)  . Smokeless tobacco: Never Used  Substance Use Topics  . Alcohol use: Yes    Alcohol/week: 0.0 oz    Comment: occasional  . Drug use: Not on file    Allergies as of 01/28/2017 - Review Complete 01/04/2017  Allergen Reaction Noted  . Latex Rash 05/01/2015    Review of Systems:    All systems reviewed and negative except where noted in HPI.   Physical Exam:  Vital signs in last 24 hours: Temp:   [97.7 F (36.5 C)-98.7 F (37.1 C)] 98.7 F (37.1 C) (11/18 1700) Pulse Rate:  [88-127] 116 (11/18 1800) Resp:  [15-25] 24 (11/18 1700) BP: (75-122)/(49-83) 95/49 (11/18 1800) SpO2:  [93 %-100 %] 93 % (11/18 1800) FiO2 (%):  [55 %-90 %] 55 % (11/18 1600) Last BM Date: 01/21/2017 General: Intubated, appears comfortable  head:  Normocephalic and atraumatic. Eyes:   No icterus.   Conjunctiva pink.  Ears:  Normal auditory acuity. Neck:  Supple; no masses or thyroidomegaly Lungs: Respirations even and unlabored. Lungs clear to auscultation bilaterally.   No wheezes, crackles, or rhonchi.  Heart:  Regular rate and rhythm;  Without murmur, clicks, rubs or gallops Abdomen:  Soft, nondistended, nontender. Normal bowel sounds. No appreciable masses or hepatomegaly.  No rebound or guarding.  Skin:  Intact without significant lesions or rashes. Cervical Nodes:  No significant cervical adenopathy.   LAB RESULTS: Recent Labs    01/30/2017 0759 01/07/2017 2007 01/10/2017 2043 01/20/17 0421  WBC 10.0  --  15.1* 10.7*  HGB 6.6* 12.5* 12.7* 11.1*  HCT 20.0*  --  38.0* 32.7*  PLT 81*  --  53* 45*   BMET Recent Labs    01/09/2017 2158 01/04/2017 0759 01/30/2017 2043  NA 137 136 135  K 4.7 4.1 4.6  CL 110 111 109  CO2 18* 17* 19*  GLUCOSE 242* 201* 186*  BUN 28* 26* 25*  CREATININE 1.13 1.07 0.95  CALCIUM 6.4* 6.7* 6.3*   LFT Recent Labs    01/12/2017 0452 01/16/2017 2043  PROT 5.1*  --   ALBUMIN 2.6* 2.7*  AST 35  --   ALT 14*  --   ALKPHOS 179*  --   BILITOT 0.8  --    PT/INR Recent Labs    01/25/2017 0759 01/06/2017 2043  LABPROT 17.6* 14.6  INR 1.46 1.15    STUDIES: Dg Abd 1 View  Result Date: 01/20/2017 CLINICAL DATA:  Orogastric tube placement. EXAM: ABDOMEN - 1 VIEW COMPARISON:  Abdominal radiograph performed 01/03/2017 FINDINGS: The patient's enteric tube is noted ending overlying the body of the stomach. There is diffuse distention of the stomach and small bowel, and air is  also noted within the colon. This may reflect ileus, as previously noted. No free intra-abdominal air is seen, though evaluation for free air is limited on a single supine view. There is diffuse sclerosis within the visualized osseous structures. Postoperative change is noted at the right mid abdomen. IMPRESSION: Enteric tube noted ending overlying the body of the stomach. Electronically Signed   By: Garald Balding M.D.   On: 01/20/2017 00:27   Dg Abd 1 View  Result Date: 01/17/2017 CLINICAL DATA:  Initial evaluation for abdominal distension. EXAM: ABDOMEN - 1 VIEW COMPARISON:  Prior radiograph from earlier the same day. FINDINGS: Enteric tube remains in place with tip near  the GE junction. Pronounced gaseous distension of the stomach is similar to previous, suspected to at least in part be secondary to insufflation from recent EGD. Multiple dilated gas-filled loops of small bowel seen throughout the abdomen measuring up to 4.1 cm in diameter. Mild gaseous distension of large bowel as well. Diffuse pattern favors ileus. Now seen are embolization coils at the right paramedian abdomen. Adjacent surgical clips. Bibasilar opacities present within the partially visualized lung bases, which may reflect atelectasis or infiltrates. Prominent degenerative changes noted within the lower lumbar spine about the hips bilaterally. IMPRESSION: 1. Prominent gaseous distension of the stomach, similar to previous, suspected to at least in part be secondary to insufflation from recent EGD. 2. Multiple additional mildly prominent gas-filled loops scattered throughout the bowel. Diffuse pattern favors ileus. 3. Sequelae of interval embolization with coils overlying the right paramedian abdomen. Electronically Signed   By: Jeannine Boga M.D.   On: 01/15/2017 23:31   Dg Abd 1 View  Result Date: 01/29/2017 CLINICAL DATA:  OG tube placement EXAM: ABDOMEN - 1 VIEW COMPARISON:  Chest x-ray performed today.  Plain films  01/31/2017 FINDINGS: Significant gaseous distention of the stomach. OG tube tip is in the upper abdomen near the GE junction. This could be advanced several cm. IMPRESSION: OG tube tip near the GE junction. Significant gaseous distention of the stomach. Electronically Signed   By: Rolm Baptise M.D.   On: 01/23/2017 12:06   Dg Abd 1 View  Result Date: 01/26/2017 CLINICAL DATA:  Nausea and vomiting EXAM: ABDOMEN - 1 VIEW COMPARISON:  CT abdomen pelvis 01/30/2017 FINDINGS: There is diffuse sclerotic metastatic disease. The bladder is opacified by excreted contrast material. Mildly dilated small bowel in the left mid abdomen but no other evidence of small-bowel obstruction. IMPRESSION: Diffuse sclerotic metastatic disease. No radiographic evidence of small-bowel obstruction. Electronically Signed   By: Ulyses Jarred M.D.   On: 01/13/2017 21:51   Dg Chest Port 1 View  Result Date: 01/13/2017 CLINICAL DATA:  ET tube and central line placement EXAM: PORTABLE CHEST 1 VIEW COMPARISON:  01/10/2017 FINDINGS: Endotracheal tube is approximately 4.5 cm above the carina. Left central line tip in the upper SVC. No pneumothorax. NG tube tip near the GE junction with gaseous distention of the stomach. Cardiomegaly. Aortic calcifications. Elevation of the left hemidiaphragm with left base atelectasis. Mild vascular congestion. IMPRESSION: Support devices as above. NG tube tip is near the GE junction and could be advanced slightly. Gaseous distention of the stomach. Cardiomegaly, vascular congestion. Elevation of the left hemidiaphragm with left base atelectasis. Electronically Signed   By: Rolm Baptise M.D.   On: 01/17/2017 12:05      Impression / Plan:   LAMERE LIGHTNER is a 83 y.o. y/o male with duodenal bulb ulcer, status post EGD and embolization yesterday with stable hemoglobin at this time  Patient's hemoglobin has stabilized since his procedures yesterday Continue Protonix 40 mg IV twice daily With  NSAIDs Cardiology consult to determine timing of resuming Coumadin in the future given 2 admissions with GI bleeding in this elderly gentleman. Continue serial CBCs and transfuse as needed  Thank you for involving me in the care of this patient.      LOS: 2 days   Virgel Manifold, MD  01/20/2017, 7:21 PM

## 2017-01-20 NOTE — Progress Notes (Signed)
Initial Nutrition Assessment  DOCUMENTATION CODES:   Obesity unspecified  INTERVENTION:  Once ileus resolves and patient is stable enough to feed from GI perspective, recommend initiating Vital High Protein at 20 mL/hr. After 8 hours can advance to goal regimen of 40 mL/hr (960 mL goal daily volume) + Pro-Stat 60 mL TID via OGT. Goal regimen provides 1560 kcal, 174 grams of protein, 806 mL H2O daily.  If tube feeds are initiated recommend providing liquid MVI daily per tube as goal TF regimen does not meet 100% RDIs for vitamins/minerals.  NUTRITION DIAGNOSIS:   Inadequate oral intake related to inability to eat as evidenced by NPO status(patient currently intubated).  GOAL:   Provide needs based on ASPEN/SCCM guidelines  MONITOR:   Vent status, Labs, Weight trends, TF tolerance, Skin, I & O's  REASON FOR ASSESSMENT:   Ventilator    ASSESSMENT:   83 year old male with PMHx of COPD, metastatic prostate cancer on chemotherapy, HLD, A-fib who is admitted with septic shock, respiratory distress, GI bleed. Patient was intubated 11/17 for EGD, which found cratered duodenal ulcer with visible vessel s/p placement of four hemostatic clips, injection with epinephrine for hemostasis, and coagulation with bipolar cautery. Patient also s/p embolization by IR on 11/17.   -Per abdominal x-ray today patient has diffuse distention of stomach and small bowel, also has air in colon. May reflect ileus.  Patient's family at bedside at time of RD assessment. They report patient had not been eating very well directly PTA. Report he likes sweets. He has been weight stable. Per chart patient was 216.5 lbs on 01/04/2017. Will continue to monitor weight trend. Abdomen distended on exam but still soft.  Access: 54 French OGT placed 11/18; terminates in body of stomach per abdominal x-ray today; 62 cm at corner of mouth; currently to LIS  MAP: 65-93 mmHg  Patient is currently intubated on ventilator  support MV: 13 L/min Temp (24hrs), Avg:98 F (36.7 C), Min:97.3 F (36.3 C), Max:98.7 F (37.1 C)  Propofol: N/A  Medications reviewed and include: Colace, ferrous sulfate 325 mg BID, methylprednisolone 60 mg Q6hrs, Remeron 15 mg QHS, pantoprazole, Miralax, amiodarone infusion, fentanyl gtt, phenylephrine gtt currently 7.5 mL/hr, Zosyn.  Labs reviewed: Lactic Acid 2.5, Phosphorus and Magnesium WNL. On 11/17 CO2 19, BUN 25, Calcium 6.3 (corrects to 7.34 with albumin of 2.7).  Patient is at risk for malnutrition, but does not meet criteria for malnutrition at this time.  Discussed with RN.  NUTRITION - FOCUSED PHYSICAL EXAM:    Most Recent Value  Orbital Region  No depletion  Upper Arm Region  No depletion  Thoracic and Lumbar Region  No depletion  Buccal Region  Unable to assess  Temple Region  Mild depletion  Clavicle Bone Region  No depletion  Clavicle and Acromion Bone Region  No depletion  Scapular Bone Region  Unable to assess  Dorsal Hand  No depletion  Patellar Region  No depletion  Anterior Thigh Region  No depletion  Posterior Calf Region  No depletion  Edema (RD Assessment)  Mild [bilateral lower extremities]  Hair  Reviewed  Eyes  Unable to assess  Mouth  Unable to assess  Skin  Reviewed  Nails  Reviewed     Diet Order:  Diet clear liquid Room service appropriate? Yes; Fluid consistency: Thin  EDUCATION NEEDS:   No education needs have been identified at this time  Skin:  Skin Assessment: Skin Integrity Issues: Skin Integrity Issues:: Other (Comment) Other: skin tear  right arm; wounds to bilateral legs with dressings in place  Last BM:  01/20/2017 - medium black type 7  Height:   Ht Readings from Last 1 Encounters:  01/17/2017 6\' 1"  (1.854 m)    Weight:   Wt Readings from Last 1 Encounters:  01/10/2017 241 lb (109.3 kg)    Ideal Body Weight:  83.6 kg  BMI:  Body mass index is 31.8 kg/m.  Estimated Nutritional Needs:   Kcal:  0979-4997  (11-14 kcal/kg)  Protein:  >/= 167 grams (>/= 2 grams/kg IBW)  Fluid:  2 L/day (25 mL/kg IBW)  Willey Blade, MS, RD, LDN Office: [phone] Pager: [phone] After Hours/Weekend Pager: [phone]

## 2017-01-20 NOTE — Progress Notes (Signed)
Pharmacy Antibiotic Note  Chad Kirby is a 83 y.o. male admitted on 01/26/2017 with sepsis and possible PNA.  Pharmacy has been consulted for Zosyn and Vancomycin dosing.  Patient received one dose of zosyn and vancomycin 1000mg  once in the ED.   Vancomycin was discontinued 11/16 because MRSA PCR was negative.   Plan: Continue Zosyn 3.375g Q8H.    Temp (24hrs), Avg:98 F (36.7 C), Min:97.3 F (36.3 C), Max:98.7 F (37.1 C)  Recent Labs  Lab 01/06/2017 0452 01/24/2017 0717 01/08/2017 0812 01/03/2017 2158 01/29/2017 0237 01/12/2017 0759 01/20/2017 1837 01/07/2017 2043 01/20/17 0421 01/20/17 1024  WBC 7.0  --  7.5 5.4 8.8 10.0  --  15.1* 10.7*  --   CREATININE 1.15  --  1.03 1.13  --  1.07  --  0.95  --   --   LATICACIDVEN 3.5* 4.0*  --   --   --  4.5* 2.1*  --   --  2.5*    Estimated Creatinine Clearance: 73.7 mL/min (by C-G formula based on SCr of 0.95 mg/dL).    Allergies  Allergen Reactions  . Latex Rash    Antimicrobials this admission: Vancomycin 11/16 >> 11/16 Zosyn 11/16 >>   Dose adjustments this admission:   Microbiology results: BCx: 11/16 >> pending  MRSA PCR: 11/16 >> NEGATIVE  Thank you for allowing pharmacy to be a part of this patient's care.  Chinita Greenland PharmD Clinical Pharmacist 01/20/2017

## 2017-01-20 NOTE — Progress Notes (Addendum)
[facility] [place] NAME: Chad Kirby    MR#:  [id number]  DATE OF BIRTH:  1929/06/19  Patient had EGD which showed invasive status post embolization, now intubated, sedated, on amiodarone drip.  CHIEF COMPLAINT:   Chief Complaint  Patient presents with  . Shortness of Breath  . Rectal Bleeding    REVIEW OF SYSTEMS: unble to obtain review of systems   :  intubated, sedated.  DRUG ALLERGIES:   Allergies  Allergen Reactions  . Latex Rash    VITALS:  Blood pressure (!) 100/58, pulse (!) 101, temperature 98.5 F (36.9 C), temperature source Axillary, resp. rate (!) 24, height 6\' 1"  (1.854 m), weight 109.3 kg (241 lb), SpO2 96 %.  PHYSICAL EXAMINATION:  GENERAL:  83 y.o.-year-old patient lying in the bed with  intubated, sedated,  EYES: Pupils equal, round, HEENT: Head atraumatic, normocephalic.  Orally intubated.Marland Kitchen  NECK:  Supple, no jugular venous distention. No thyroid enlargement,  LUNGS: Normal breath sounds bilaterally, no wheezing, rales,rhonchi or crepitation. No use of accessory muscles of respiration.  CARDIOVASCULAR: S1, S2 normal. No murmurs, rubs, or gallops.  ABDOMEN: Soft, nontender, nondistended. Bowel sounds present. No organomegaly or mass.  EXTREMITIES: No pedal edema, cyanosis, or clubbing.  NEUROLOGIC: intubated/, sedated. PSYCHIATRIC:  SKIN: No obvious rash, lesion, or ulcer.    LABORATORY PANEL:   CBC Recent Labs  Lab 01/20/17 0421  WBC 10.7*  HGB 11.1*  HCT 32.7*  PLT 45*   ------------------------------------------------------------------------------------------------------------------  Chemistries  Recent Labs  Lab 01/15/2017 0452  01/08/2017 2043 01/20/17 0421  NA 138   < > 135  --   K 4.6   < > 4.6  --   CL 106   < > 109  --   CO2 24   < > 19*  --   GLUCOSE 149*   < > 186*  --   BUN 17   < > 25*  --   CREATININE 1.15   < > 0.95  --   CALCIUM 7.7*   < > 6.3*  --   MG  --    < >  2.3 2.1  AST 35  --   --   --   ALT 14*  --   --   --   ALKPHOS 179*  --   --   --   BILITOT 0.8  --   --   --    < > = values in this interval not displayed.   ------------------------------------------------------------------------------------------------------------------  Cardiac Enzymes Recent Labs  Lab 01/23/2017 0759  TROPONINI <0.03   ------------------------------------------------------------------------------------------------------------------  RADIOLOGY:  Ct Abdomen Pelvis Wo Contrast  Result Date: 01/16/2017 CLINICAL DATA:  Abdominal distention and low hemoglobin. History of prostate cancer. EXAM: CT ABDOMEN AND PELVIS WITHOUT CONTRAST TECHNIQUE: Multidetector CT imaging of the abdomen and pelvis was performed following the standard protocol without IV contrast. COMPARISON:  CTA chest 01/20/2017 CT abdomen pelvis 05/04/2015 FINDINGS: Lower chest: Small bilateral pleural effusions with associated atelectasis. Cardiomegaly. Hepatobiliary: Normal hepatic contours and density. No visible biliary dilatation. Normal gallbladder. Pancreas: Mild pancreatic atrophy, likely senescent. Spleen: Normal. Adrenals/Urinary Tract: --Adrenal glands: Normal. --Right kidney/ureter: Hyperdensities in multiple areas of the collecting system, favored to be due to recent IV contrast administration. --Left kidney/ureter: 7 mm left upper pole calculus. The other hyperdensities in the collecting system are likely due to recent contrast administration. --Urinary bladder: Opacification of the urinary bladder secondary to recent contrast administration for  CTA chest. Stomach/Bowel: --Stomach/Duodenum: Patulous lower esophagus. Normal stomach and duodenum. --Small bowel: No dilatation or inflammation. --Colon: No focal abnormality. --Appendix: Not visualized. No right lower quadrant inflammation or free fluid. Vascular/Lymphatic: There is atherosclerotic calcification of the non aneurysmal abdominal aorta. There  is also calcification of the major branch vessels of the aorta. Normal course and caliber of the major abdominal vessels. No abdominal or pelvic lymphadenopathy. Reproductive: Normal size of the prostate gland with multiple calcifications in the left peripheral zone. Musculoskeletal. Extensive sclerotic metastatic disease of the thoracolumbar spine and pelvis, progressed relative to the study of 05/04/2015. No pathologic fracture or bony spinal canal stenosis. There is moderate to severe neural foraminal stenosis at L4-5 and L5-S1. There also multiple sclerotic metastases within the visible portions of the ribs. Other: None. IMPRESSION: 1. No acute abdominopelvic abnormality. 2. Small bilateral pleural effusions and moderate cardiomegaly. 3. Severe, extensive sclerotic metastatic disease of the pelvis, ribs and thoracolumbar spine, progressed relative to 05/04/2015. No pathologic fracture. 4.  Aortic Atherosclerosis (ICD10-I70.0). 5. Hyperdensities within both renal collecting systems and urinary bladder are secondary to recent IV contrast administration for CTA chest. Electronically Signed   By: Ulyses Jarred M.D.   On: 01/20/2017 13:57   Dg Abd 1 View  Result Date: 01/20/2017 CLINICAL DATA:  Orogastric tube placement. EXAM: ABDOMEN - 1 VIEW COMPARISON:  Abdominal radiograph performed 01/05/2017 FINDINGS: The patient's enteric tube is noted ending overlying the body of the stomach. There is diffuse distention of the stomach and small bowel, and air is also noted within the colon. This may reflect ileus, as previously noted. No free intra-abdominal air is seen, though evaluation for free air is limited on a single supine view. There is diffuse sclerosis within the visualized osseous structures. Postoperative change is noted at the right mid abdomen. IMPRESSION: Enteric tube noted ending overlying the body of the stomach. Electronically Signed   By: Garald Balding M.D.   On: 01/20/2017 00:27   Dg Abd 1  View  Result Date: 01/15/2017 CLINICAL DATA:  Initial evaluation for abdominal distension. EXAM: ABDOMEN - 1 VIEW COMPARISON:  Prior radiograph from earlier the same day. FINDINGS: Enteric tube remains in place with tip near the GE junction. Pronounced gaseous distension of the stomach is similar to previous, suspected to at least in part be secondary to insufflation from recent EGD. Multiple dilated gas-filled loops of small bowel seen throughout the abdomen measuring up to 4.1 cm in diameter. Mild gaseous distension of large bowel as well. Diffuse pattern favors ileus. Now seen are embolization coils at the right paramedian abdomen. Adjacent surgical clips. Bibasilar opacities present within the partially visualized lung bases, which may reflect atelectasis or infiltrates. Prominent degenerative changes noted within the lower lumbar spine about the hips bilaterally. IMPRESSION: 1. Prominent gaseous distension of the stomach, similar to previous, suspected to at least in part be secondary to insufflation from recent EGD. 2. Multiple additional mildly prominent gas-filled loops scattered throughout the bowel. Diffuse pattern favors ileus. 3. Sequelae of interval embolization with coils overlying the right paramedian abdomen. Electronically Signed   By: Jeannine Boga M.D.   On: 01/10/2017 23:31   Dg Abd 1 View  Result Date: 01/08/2017 CLINICAL DATA:  OG tube placement EXAM: ABDOMEN - 1 VIEW COMPARISON:  Chest x-ray performed today.  Plain films 01/08/2017 FINDINGS: Significant gaseous distention of the stomach. OG tube tip is in the upper abdomen near the GE junction. This could be advanced several cm. IMPRESSION: OG tube  tip near the GE junction. Significant gaseous distention of the stomach. Electronically Signed   By: Rolm Baptise M.D.   On: 01/30/2017 12:06   Dg Abd 1 View  Result Date: 01/31/2017 CLINICAL DATA:  Nausea and vomiting EXAM: ABDOMEN - 1 VIEW COMPARISON:  CT abdomen pelvis  01/21/2017 FINDINGS: There is diffuse sclerotic metastatic disease. The bladder is opacified by excreted contrast material. Mildly dilated small bowel in the left mid abdomen but no other evidence of small-bowel obstruction. IMPRESSION: Diffuse sclerotic metastatic disease. No radiographic evidence of small-bowel obstruction. Electronically Signed   By: Ulyses Jarred M.D.   On: 01/15/2017 21:51   Dg Chest Port 1 View  Result Date: 01/24/2017 CLINICAL DATA:  ET tube and central line placement EXAM: PORTABLE CHEST 1 VIEW COMPARISON:  01/17/2017 FINDINGS: Endotracheal tube is approximately 4.5 cm above the carina. Left central line tip in the upper SVC. No pneumothorax. NG tube tip near the GE junction with gaseous distention of the stomach. Cardiomegaly. Aortic calcifications. Elevation of the left hemidiaphragm with left base atelectasis. Mild vascular congestion. IMPRESSION: Support devices as above. NG tube tip is near the GE junction and could be advanced slightly. Gaseous distention of the stomach. Cardiomegaly, vascular congestion. Elevation of the left hemidiaphragm with left base atelectasis. Electronically Signed   By: Rolm Baptise M.D.   On: 01/05/2017 12:05    EKG:   Orders placed or performed during the hospital encounter of 01/15/2017  . ED EKG 12-Lead  . ED EKG 12-Lead  . EKG 12-Lead  . EKG 12-Lead  . EKG 12-Lead  . EKG 12-Lead    ASSESSMENT AND PLAN:  Acute respiratory failure secondary to COPD exacerbation: Patient is on vent prophylactically for EGD yesterday but unable to extubate because patient is still needing full vent support with high FiO2..continue Mechanical ventilation today.  2.  Septic shock due to UTI, healthcare associated pneumonia: Continue vancomycin, Zosyn, IV fluids, pressors 3.  Metastatic  prostate cancer: Patient has diffuse skeletal metastases.  Getting treatment at [facility].   4.  Acute blood loss anemia secondary to GI bleed secondary to bleeding peptic  ulcer.  Status post mesenteric angiogram, embolization yesterday.  Hemoglobin stable after the procedure, globin 11.1 today.  #4. atrial fibrillation with RVR: On amiodarone drip.  Stopped anticoagulation due to GI bleed, patient received FFP, vitamin K,  3 packed RBC transfusion.  Prognosis really poor.  All the records are reviewed and case discussed with Care Management/Social Workerr. Management plans discussed with the patient, family and they are in agreement.  CODE STATUS: DNR( patient intubated for EGD)  TOTAL TIME TAKING CARE OF THIS PATIENT: 35 minutes.   Critically ill at this time unable to plan discharge date.  Epifanio Lesches M.D on 01/20/2017 at 12:59 PM  Between 7am to 6pm - Pager - [phone]  After 6pm go to [email or web address] - password [email or web address] [facility]  Office  [phone]  CC: Primary care physician; [facility]   Note: This dictation was prepared with Dragon dictation along with smaller phrase technology. Any transcriptional errors that result from this process are unintentional.

## 2017-01-20 NOTE — Progress Notes (Addendum)
[facility] Progess Note    ASSESSMENT/PLAN   Gastrointestinal bleeding. Patient status post visible vessel, pulsatile duodenal bleed, unable to obtain hemostasis with endoscopy, status post interventional radiology guided embolization. No clear evidence of active bleeding, hemoglobin is stable at 11.1, platelet count of 45, last INR was 1.15. Will give 1 unit of platelets.  Ventilator dependent respiratory failure. We'll leave on mechanical ventilation today, in case of any further bleeding episodes. Will look tomorrow if patient remains stable towards spontaneous awakening and breathing trial  Hypotension. Improving, weaning Neo-Synephrine, most likely multifactorial to include sepsis and GI blood loss  Probable sepsis. Etiology of hypotension his blood loss anemia, will continue to empirically cover with vancomycin and Zosyn  History of prostate cancer. Being treated at [facility] fibrillation. With rapid ventricular response, presently on amiodarone  Critical care time 40 minutes Name: Chad Kirby MRN: [id number] DOB: 1929-07-15     SUBJECTIVE:   Patient had acute episode of bleeding yesterday, status post intubation, Central line placement, EGD with pulsatile visible duodenal ulcer, unable to obtain hemostasis via EGD, status post IR guided vessel embolization. Stable overnight  VITAL SIGNS: Temp:  [97.3 F (36.3 C)-98.7 F (37.1 C)] 97.7 F (36.5 C) (11/18 0700) Pulse Rate:  [78-131] 96 (11/18 0700) Resp:  [14-34] 24 (11/18 0700) BP: (95-135)/(61-87) 104/61 (11/18 0700) SpO2:  [92 %-100 %] 99 % (11/18 0700) FiO2 (%):  [35 %-90 %] 70 % (11/18 0849) Weight:  [109.3 kg (241 lb)-109.6 kg (241 lb 10 oz)] 109.3 kg (241 lb) (11/17 1442)  PHYSICAL EXAMINATION: Physical Examination:   Patient intubated on mechanical ventilation  VS: BP 104/61   Pulse 96   Temp 97.7 F (36.5 C)   Resp (!) 24   Ht 6\' 1"  (1.854 m)   Wt 109.3 kg  (241 lb)   SpO2 99%   BMI 31.80 kg/m    Neuro: Patient somnolent but arousable  Pulmonary: Coarse expiratory rhonchi noted Cardiovascular atrial fibrillation with rapid ventricular response 90 Abdomen: Distended abdomen tympanic with hypoactive bowel sounds. OG tube was placed in to suction per GI Skin:   warm, no rashes, no ecchymosis  Extremities: normal, no cyanosis, clubbing.    LABORATORY PANEL:   CBC Recent Labs  Lab 01/20/17 0421  WBC 10.7*  HGB 11.1*  HCT 32.7*  PLT 45*    Chemistries  Recent Labs  Lab 01/09/2017 0452  01/23/2017 2043 01/20/17 0421  NA 138   < > 135  --   K 4.6   < > 4.6  --   CL 106   < > 109  --   CO2 24   < > 19*  --   GLUCOSE 149*   < > 186*  --   BUN 17   < > 25*  --   CREATININE 1.15   < > 0.95  --   CALCIUM 7.7*   < > 6.3*  --   MG  --    < > 2.3 2.1  PHOS  --    < > 3.1 2.5  AST 35  --   --   --   ALT 14*  --   --   --   ALKPHOS 179*  --   --   --   BILITOT 0.8  --   --   --    < > = values in this interval not displayed.    Recent Labs  Lab 01/15/2017 [phone]  GLUCAP 116*   Recent Labs  Lab 01/20/2017 1546 01/28/2017 2033 01/20/17 0302  PHART PENDING 7.12* 7.31*  PCO2ART 63* 62* 37  PO2ART 65* 58* 66*   Recent Labs  Lab 01/14/2017 0452 01/30/2017 2043  AST 35  --   ALT 14*  --   ALKPHOS 179*  --   BILITOT 0.8  --   ALBUMIN 2.6* 2.7*    Cardiac Enzymes Recent Labs  Lab 01/28/2017 0759  TROPONINI <0.03    RADIOLOGY:  Ct Abdomen Pelvis Wo Contrast  Result Date: 01/11/2017 CLINICAL DATA:  Abdominal distention and low hemoglobin. History of prostate cancer. EXAM: CT ABDOMEN AND PELVIS WITHOUT CONTRAST TECHNIQUE: Multidetector CT imaging of the abdomen and pelvis was performed following the standard protocol without IV contrast. COMPARISON:  CTA chest 01/26/2017 CT abdomen pelvis 05/04/2015 FINDINGS: Lower chest: Small bilateral pleural effusions with associated atelectasis. Cardiomegaly. Hepatobiliary: Normal hepatic  contours and density. No visible biliary dilatation. Normal gallbladder. Pancreas: Mild pancreatic atrophy, likely senescent. Spleen: Normal. Adrenals/Urinary Tract: --Adrenal glands: Normal. --Right kidney/ureter: Hyperdensities in multiple areas of the collecting system, favored to be due to recent IV contrast administration. --Left kidney/ureter: 7 mm left upper pole calculus. The other hyperdensities in the collecting system are likely due to recent contrast administration. --Urinary bladder: Opacification of the urinary bladder secondary to recent contrast administration for CTA chest. Stomach/Bowel: --Stomach/Duodenum: Patulous lower esophagus. Normal stomach and duodenum. --Small bowel: No dilatation or inflammation. --Colon: No focal abnormality. --Appendix: Not visualized. No right lower quadrant inflammation or free fluid. Vascular/Lymphatic: There is atherosclerotic calcification of the non aneurysmal abdominal aorta. There is also calcification of the major branch vessels of the aorta. Normal course and caliber of the major abdominal vessels. No abdominal or pelvic lymphadenopathy. Reproductive: Normal size of the prostate gland with multiple calcifications in the left peripheral zone. Musculoskeletal. Extensive sclerotic metastatic disease of the thoracolumbar spine and pelvis, progressed relative to the study of 05/04/2015. No pathologic fracture or bony spinal canal stenosis. There is moderate to severe neural foraminal stenosis at L4-5 and L5-S1. There also multiple sclerotic metastases within the visible portions of the ribs. Other: None. IMPRESSION: 1. No acute abdominopelvic abnormality. 2. Small bilateral pleural effusions and moderate cardiomegaly. 3. Severe, extensive sclerotic metastatic disease of the pelvis, ribs and thoracolumbar spine, progressed relative to 05/04/2015. No pathologic fracture. 4.  Aortic Atherosclerosis (ICD10-I70.0). 5. Hyperdensities within both renal collecting systems  and urinary bladder are secondary to recent IV contrast administration for CTA chest. Electronically Signed   By: Ulyses Jarred M.D.   On: 01/25/2017 13:57   Dg Abd 1 View  Result Date: 01/20/2017 CLINICAL DATA:  Orogastric tube placement. EXAM: ABDOMEN - 1 VIEW COMPARISON:  Abdominal radiograph performed 01/23/2017 FINDINGS: The patient's enteric tube is noted ending overlying the body of the stomach. There is diffuse distention of the stomach and small bowel, and air is also noted within the colon. This may reflect ileus, as previously noted. No free intra-abdominal air is seen, though evaluation for free air is limited on a single supine view. There is diffuse sclerosis within the visualized osseous structures. Postoperative change is noted at the right mid abdomen. IMPRESSION: Enteric tube noted ending overlying the body of the stomach. Electronically Signed   By: Garald Balding M.D.   On: 01/20/2017 00:27   Dg Abd 1 View  Result Date: 01/17/2017 CLINICAL DATA:  Initial evaluation for abdominal distension. EXAM: ABDOMEN - 1 VIEW COMPARISON:  Prior radiograph from earlier the same  day. FINDINGS: Enteric tube remains in place with tip near the GE junction. Pronounced gaseous distension of the stomach is similar to previous, suspected to at least in part be secondary to insufflation from recent EGD. Multiple dilated gas-filled loops of small bowel seen throughout the abdomen measuring up to 4.1 cm in diameter. Mild gaseous distension of large bowel as well. Diffuse pattern favors ileus. Now seen are embolization coils at the right paramedian abdomen. Adjacent surgical clips. Bibasilar opacities present within the partially visualized lung bases, which may reflect atelectasis or infiltrates. Prominent degenerative changes noted within the lower lumbar spine about the hips bilaterally. IMPRESSION: 1. Prominent gaseous distension of the stomach, similar to previous, suspected to at least in part be secondary  to insufflation from recent EGD. 2. Multiple additional mildly prominent gas-filled loops scattered throughout the bowel. Diffuse pattern favors ileus. 3. Sequelae of interval embolization with coils overlying the right paramedian abdomen. Electronically Signed   By: Jeannine Boga M.D.   On: 01/30/2017 23:31   Dg Abd 1 View  Result Date: 01/15/2017 CLINICAL DATA:  OG tube placement EXAM: ABDOMEN - 1 VIEW COMPARISON:  Chest x-ray performed today.  Plain films 01/13/2017 FINDINGS: Significant gaseous distention of the stomach. OG tube tip is in the upper abdomen near the GE junction. This could be advanced several cm. IMPRESSION: OG tube tip near the GE junction. Significant gaseous distention of the stomach. Electronically Signed   By: Rolm Baptise M.D.   On: 01/10/2017 12:06   Dg Abd 1 View  Result Date: 01/08/2017 CLINICAL DATA:  Nausea and vomiting EXAM: ABDOMEN - 1 VIEW COMPARISON:  CT abdomen pelvis 01/16/2017 FINDINGS: There is diffuse sclerotic metastatic disease. The bladder is opacified by excreted contrast material. Mildly dilated small bowel in the left mid abdomen but no other evidence of small-bowel obstruction. IMPRESSION: Diffuse sclerotic metastatic disease. No radiographic evidence of small-bowel obstruction. Electronically Signed   By: Ulyses Jarred M.D.   On: 01/15/2017 21:51   Dg Chest Port 1 View  Result Date: 01/04/2017 CLINICAL DATA:  ET tube and central line placement EXAM: PORTABLE CHEST 1 VIEW COMPARISON:  01/16/2017 FINDINGS: Endotracheal tube is approximately 4.5 cm above the carina. Left central line tip in the upper SVC. No pneumothorax. NG tube tip near the GE junction with gaseous distention of the stomach. Cardiomegaly. Aortic calcifications. Elevation of the left hemidiaphragm with left base atelectasis. Mild vascular congestion. IMPRESSION: Support devices as above. NG tube tip is near the GE junction and could be advanced slightly. Gaseous distention of the  stomach. Cardiomegaly, vascular congestion. Elevation of the left hemidiaphragm with left base atelectasis. Electronically Signed   By: Rolm Baptise M.D.   On: 01/15/2017 12:05    Hermelinda Dellen, DO

## 2017-01-21 ENCOUNTER — Encounter: Payer: Self-pay | Admitting: Gastroenterology

## 2017-01-21 ENCOUNTER — Inpatient Hospital Stay: Payer: Medicare Other

## 2017-01-21 ENCOUNTER — Inpatient Hospital Stay (HOSPITAL_COMMUNITY): Payer: Medicare Other

## 2017-01-21 ENCOUNTER — Other Ambulatory Visit: Payer: Self-pay

## 2017-01-21 DIAGNOSIS — R578 Other shock: Secondary | ICD-10-CM

## 2017-01-21 HISTORY — PX: IR HYBRID TRAUMA EMBOLIZATION: IMG5539

## 2017-01-21 LAB — COMPREHENSIVE METABOLIC PANEL
ALBUMIN: 2.3 g/dL — AB (ref 3.5–5.0)
ALT: 13 U/L — AB (ref 17–63)
AST: 16 U/L (ref 15–41)
Alkaline Phosphatase: 98 U/L (ref 38–126)
Anion gap: 8 (ref 5–15)
BILIRUBIN TOTAL: 0.8 mg/dL (ref 0.3–1.2)
BUN: 26 mg/dL — AB (ref 6–20)
CHLORIDE: 112 mmol/L — AB (ref 101–111)
CO2: 19 mmol/L — ABNORMAL LOW (ref 22–32)
CREATININE: 0.84 mg/dL (ref 0.61–1.24)
Calcium: 5.6 mg/dL — CL (ref 8.9–10.3)
GFR calc Af Amer: >60 mL/min (ref >60)
GLUCOSE: 180 mg/dL — AB (ref 65–99)
POTASSIUM: 3.5 mmol/L (ref 3.5–5.1)
Sodium: 139 mmol/L (ref 135–145)
Total Protein: 4 g/dL — ABNORMAL LOW (ref 6.5–8.1)

## 2017-01-21 LAB — TRIGLYCERIDES: TRIGLYCERIDES: 146 mg/dL (ref <150)

## 2017-01-21 LAB — BPAM PLATELET PHERESIS
BLOOD PRODUCT EXPIRATION DATE: 201811192359
ISSUE DATE / TIME: 201811181109
UNIT TYPE AND RH: 7300

## 2017-01-21 LAB — CBC
HCT: 30.9 % — ABNORMAL LOW (ref 40.0–52.0)
HCT: 35.5 % — ABNORMAL LOW (ref 40.0–52.0)
HEMOGLOBIN: 10.5 g/dL — AB (ref 13.0–18.0)
HEMOGLOBIN: 11.8 g/dL — AB (ref 13.0–18.0)
MCH: 30.7 pg (ref 26.0–34.0)
MCH: 30.3 pg (ref 26.0–34.0)
MCHC: 34 g/dL (ref 32.0–36.0)
MCHC: 33.2 g/dL (ref 32.0–36.0)
MCV: 90.2 fL (ref 80.0–100.0)
MCV: 91.1 fL (ref 80.0–100.0)
PLATELETS: 62 10*3/uL — AB (ref 150–440)
Platelets: 77 10*3/uL — ABNORMAL LOW (ref 150–440)
RBC: 3.43 MIL/uL — ABNORMAL LOW (ref 4.40–5.90)
RBC: 3.89 MIL/uL — AB (ref 4.40–5.90)
RDW: 15.9 % — ABNORMAL HIGH (ref 11.5–14.5)
RDW: 16.4 % — ABNORMAL HIGH (ref 11.5–14.5)
WBC: 8.9 10*3/uL (ref 3.8–10.6)
WBC: 12 10*3/uL — ABNORMAL HIGH (ref 3.8–10.6)

## 2017-01-21 LAB — PREPARE PLATELET PHERESIS: Unit division: 0

## 2017-01-21 LAB — MAGNESIUM: MAGNESIUM: 2.1 mg/dL (ref 1.7–2.4)

## 2017-01-21 LAB — LACTIC ACID, PLASMA: Lactic Acid, Venous: 1.2 mmol/L (ref 0.5–1.9)

## 2017-01-21 LAB — PREPARE RBC (CROSSMATCH)

## 2017-01-21 LAB — GLUCOSE, CAPILLARY
Glucose-Capillary: 201 mg/dL — ABNORMAL HIGH (ref 65–99)
Glucose-Capillary: 174 mg/dL — ABNORMAL HIGH (ref 65–99)

## 2017-01-21 LAB — PHOSPHORUS: Phosphorus: 1.7 mg/dL — ABNORMAL LOW (ref 2.5–4.6)

## 2017-01-21 MED ORDER — POTASSIUM CHLORIDE 2 MEQ/ML IV SOLN
INTRAVENOUS | Status: DC
  Administered 2017-01-21: 10:00:00 via INTRAVENOUS
  Filled 2017-01-21 (×3): qty 1000

## 2017-01-21 MED ORDER — MIRTAZAPINE 15 MG PO TABS: 15.0000 mg | ORAL_TABLET | Freq: Every day | ORAL | Status: DC

## 2017-01-21 MED ORDER — IOPAMIDOL (ISOVUE-300) INJECTION 61%
100.0000 mL | Freq: Once | INTRAVENOUS | Status: AC | PRN
  Administered 2017-01-21: 40 mL via INTRA_ARTERIAL

## 2017-01-21 MED ORDER — DIGOXIN 250 MCG PO TABS
0.2500 mg | ORAL_TABLET | Freq: Every day | ORAL | Status: DC
  Administered 2017-01-21 – 2017-01-22 (×2): 0.25 mg
  Filled 2017-01-21 (×2): qty 1

## 2017-01-21 MED ORDER — POTASSIUM PHOSPHATES 15 MMOLE/5ML IV SOLN
30.0000 mmol | Freq: Once | INTRAVENOUS | Status: DC
  Filled 2017-01-21: qty 10

## 2017-01-21 MED ORDER — MORPHINE SULFATE (PF) 2 MG/ML IV SOLN: 2.0000 mg | INTRAVENOUS | Status: DC | PRN

## 2017-01-21 MED ORDER — ATORVASTATIN CALCIUM 80 MG PO TABS: 80.0000 mg | ORAL_TABLET | Freq: Every day | ORAL | Status: DC

## 2017-01-21 MED ORDER — SODIUM CHLORIDE 0.9 % IV SOLN
2.0000 g | Freq: Once | INTRAVENOUS | Status: AC
  Administered 2017-01-21: 2 g via INTRAVENOUS
  Filled 2017-01-21: qty 20

## 2017-01-21 MED ORDER — POTASSIUM PHOSPHATES 15 MMOLE/5ML IV SOLN: 30.0000 mmol | Freq: Once | INTRAVENOUS | Status: DC

## 2017-01-21 MED ORDER — PROPOFOL 1000 MG/100ML IV EMUL
0.0000 ug/kg/min | INTRAVENOUS | Status: DC
  Administered 2017-01-21: 10 ug/kg/min via INTRAVENOUS
  Administered 2017-01-22: 5 ug/kg/min via INTRAVENOUS
  Filled 2017-01-21 (×2): qty 100

## 2017-01-21 MED ORDER — LIDOCAINE HCL (PF) 1 % IJ SOLN: 5.0000 mL | Freq: Once | INTRAMUSCULAR | Status: DC

## 2017-01-21 MED ORDER — POTASSIUM CHLORIDE 10 MEQ/50ML IV SOLN
10.0000 meq | INTRAVENOUS | Status: AC
  Administered 2017-01-21 (×4): 10 meq via INTRAVENOUS
  Filled 2017-01-21 (×4): qty 50

## 2017-01-21 NOTE — Progress Notes (Signed)
[place] [facility] [place] NAME: Chad Kirby    MR#:  [id number]  DATE OF BIRTH:  10/30/1929  SUBJECTIVE:  CHIEF COMPLAINT:   Chief Complaint  Patient presents with  . Shortness of Breath  . Rectal Bleeding  remains on vent, sedated, family at bedside REVIEW OF SYSTEMS:  Review of Systems  Unable to perform ROS: Critical illness    DRUG ALLERGIES:   Allergies  Allergen Reactions  . Fentanyl     Myoclonus  . Latex Rash   VITALS:  Blood pressure 105/64, pulse 94, temperature 97.9 F (36.6 C), resp. rate 16, height 6\' 1"  (1.854 m), weight 109.3 kg (241 lb), SpO2 97 %. PHYSICAL EXAMINATION:  Physical Exam  Constitutional: He is well-developed, well-nourished, and in no distress.  HENT:  Head: Normocephalic and atraumatic.  ETT in place  Eyes: Conjunctivae and EOM are normal. Pupils are equal, round, and reactive to light.  Neck: Normal range of motion. Neck supple. No tracheal deviation present. No thyromegaly present.  Cardiovascular: Normal rate, regular rhythm and normal heart sounds.  Pulmonary/Chest: Effort normal and breath sounds normal. No respiratory distress. He has no wheezes. He exhibits no tenderness.  Abdominal: Soft. Bowel sounds are normal. He exhibits no distension. There is no tenderness.  Musculoskeletal: Normal range of motion. He exhibits edema.  Neurological: No cranial nerve deficit.  Sedated on vent  Skin: Skin is warm and dry. No rash noted.  Psychiatric:  Sedated on vetn   LABORATORY PANEL:  Male CBC Recent Labs  Lab 01/21/17 0951  WBC 12.0*  HGB 11.8*  HCT 35.5*  PLT 77*   ------------------------------------------------------------------------------------------------------------------ Chemistries  Recent Labs  Lab 01/21/17 0524  NA 139  K 3.5  CL 112*  CO2 19*  GLUCOSE 180*  BUN 26*  CREATININE 0.84  CALCIUM 5.6*  MG 2.1  AST 16  ALT 13*  ALKPHOS 98  BILITOT 0.8   RADIOLOGY:   No results found. ASSESSMENT AND PLAN:  73 M with metastatic prostate cancer admitted with dyspnea and hematochezia. Had progressive blood loss anemia.   * Acute respiratory failure secondary to COPD exacerbation:  - elective intubation for EGD on 11/17 - vent mgmt per PCCM  * Acute blood loss anemia secondary to GI bleed secondary to bleeding peptic ulcer.  Status post mesenteric angiogram, embolization.   - EGD showing One cratered duodenal ulcer with a visible vessel was found in the duodenal bulb s/p embolization -  Stopped anticoagulation due to GI bleed, patient received FFP, vitamin K,  3 packed RBC transfusion.   * Sepsis: ruled out, stopped all Abx  *  Metastatic prostate cancer: Patient has diffuse skeletal metastases.  Getting treatment at [facility].   * atrial fibrillation with RVR: On amiodarone drip.         All the records are reviewed and case discussed with Care Management/Social Worker. Management plans discussed with the patient, family, PCCM and they are in agreement.  CODE STATUS: DNR  TOTAL TIME TAKING CARE OF THIS PATIENT: 25 minutes.   More than 50% of the time was spent in counseling/coordination of care: YES  POSSIBLE D/C IN 4-5 DAYS, DEPENDING ON CLINICAL CONDITION.   Max Sane M.D on 01/21/2017 at 8:13 PM  Between 7am to 6pm - Pager - [phone]  After 6pm go to [email or web address] - [employer] [facility]  Office  [phone]  CC: Primary care physician; [facility]  Note: This dictation was prepared with Dragon dictation along with smaller phrase technology. Any transcriptional errors that result from this process are unintentional.

## 2017-01-21 NOTE — Progress Notes (Signed)
PULMONARY / CRITICAL CARE MEDICINE   Name: RC AMISON MRN: [id number] DOB: 1929-03-08    ADMISSION DATE:  01/24/2017  PT PROFILE:   25 M with metastatic prostate cancer treated at [facility].  Admitted to SDU 01/29/2017 with dyspnea and hematochezia.  Initially treated as a severe sepsis with unclear source.  Had progressive blood loss anemia.  Underwent intubation for EGD 11/17.   MAJOR EVENTS/TEST RESULTS: 11/16 admitted as above with dyspnea and hematochezia 11/17 elective intubation for EGD 11/17 EGD: One cratered duodenal ulcer with a visible vessel was found in the duodenal bulb. The lesion was 3 mm in largest dimension. For hemostasis,   four hemostatic clips were successfully placed. Area was successfully injected with of a 1:10,000 solution of epinephrine for hemostasis. Coagulation for hemostasis using bipolar probe was successful. IR consulted for embolization, as large cratered ulcer with deep large visible vessel is at high risk of rebleeding 11/17 IR procedure: Mesenteric arteriogram, GDA embolization 11/18 KUB: diffuse distention of the stomach and small bowel, and air also noted within the colon 11/19 Requiring PEEP 8 and FiO2 45%. No weaning of vent. Weaned off of vasopressors. Myoclonic-like activity resolved with discontinuation of fentanyl infusion and transition to propofol.    INDWELLING DEVICES:: ETT 11/17 >>  L IJ CVL 11/17 >>   MICRO DATA: MRSA PCR 11/16 >> NEG Blood 11/16 >> NEG >>   ANTIMICROBIALS:  Vanc 11/16 >> 11/19 Pip-tazo 11/16 >> 11/19    SUBJECTIVE:  Sedated on fentanyl infusion with jerking movements of BUEs - myoclonic-like. This resolved after discontinuation of fentanyl infusion. Sedated on propofol. Not F/C  VITAL SIGNS: BP 105/64   Pulse 94   Temp 97.9 F (36.6 C)   Resp 16   Ht 6\' 1"  (1.854 m)   Wt 109.3 kg (241 lb)   SpO2 97%   BMI 31.80 kg/m   HEMODYNAMICS:    VENTILATOR SETTINGS: Vent Mode: PRVC FiO2 (%):  [45 %-55 %] 50  % Set Rate:  [16 bmp-24 bmp] 16 bmp Vt Set:  [500 mL] 500 mL PEEP:  [8 cmH20] 8 cmH20 Plateau Pressure:  [20 cmH20] 20 cmH20  INTAKE / OUTPUT: I/O last 3 completed shifts: In: 12126.1 [I.V.:10854.1; Blood:1172; IV Piggyback:100] Out: 1017 [Urine:1150; Emesis/NG output:275]  PHYSICAL EXAMINATION: General: Intubated, sedated, RASS -2, -3, not F/C Neuro: PERRL, EOMI, MAEs HEENT: NCAT, alopecia Cardiovascular: IRIR, mildly tachy, no M noted Lungs: clear anteriorly Abdomen: mildlt distended, + tympani, diminished BS Ext: Symmetric pretibial edema  LABS:  BMET Recent Labs  Lab 01/09/2017 0759 01/30/2017 2043 01/21/17 0524  NA 136 135 139  K 4.1 4.6 3.5  CL 111 109 112*  CO2 17* 19* 19*  BUN 26* 25* 26*  CREATININE 1.07 0.95 0.84  GLUCOSE 201* 186* 180*    Electrolytes Recent Labs  Lab 01/15/2017 0759 01/08/2017 2043 01/20/17 0421 01/21/17 0524  CALCIUM 6.7* 6.3*  --  5.6*  MG 2.4 2.3 2.1 2.1  PHOS 2.7 3.1 2.5 1.7*    CBC Recent Labs  Lab 01/20/17 0421 01/21/17 0524 01/21/17 0951  WBC 10.7* 8.9 12.0*  HGB 11.1* 10.5* 11.8*  HCT 32.7* 30.9* 35.5*  PLT 45* 62* 77*    Coag's Recent Labs  Lab 01/26/2017 0812 01/31/2017 2158 01/12/2017 0759 01/14/2017 2043  APTT 43*  --   --  26  INR 2.91 2.44 1.46 1.15    Sepsis Markers Recent Labs  Lab 01/05/2017 0812 01/24/2017 0237  01/30/2017 1837 01/20/17 0421 01/20/17 1024 01/21/17  0524  LATICACIDVEN  --   --    < > 2.1*  --  2.5* 1.2  PROCALCITON 0.32 0.45  --   --  0.39  --   --    < > = values in this interval not displayed.    ABG Recent Labs  Lab 01/07/2017 1546 01/23/2017 2033 01/20/17 0302  PHART PENDING 7.12* 7.31*  PCO2ART 63* 62* 37  PO2ART 65* 58* 66*    Liver Enzymes Recent Labs  Lab 01/03/2017 0452 01/11/2017 2043 01/21/17 0524  AST 35  --  16  ALT 14*  --  13*  ALKPHOS 179*  --  98  BILITOT 0.8  --  0.8  ALBUMIN 2.6* 2.7* 2.3*    Cardiac Enzymes Recent Labs  Lab 01/06/2017 2158 01/06/2017 0237  01/09/2017 0759  TROPONINI <0.03 <0.03 <0.03    Glucose Recent Labs  Lab 01/03/2017 0757 01/21/17 0744 01/21/17 1655  GLUCAP 116* 201* 174*    CXR: No new film   ASSESSMENT / PLAN:  PULMONARY A: Acute hypoxemic respiratory failure Initially intubated for EGD Suspect pulmonary edema P:   Cont full vent support - settings reviewed and/or adjusted Cont vent bundle Daily SBT if/when meets criteria  Recheck CXR 11/20  CARDIOVASCULAR A:  Chronic AF, rate adequately controlled Hemorrhagic shock -resolved Doubt septic shock P:  Weaned off of phenylephrine - resume as needed to maintain an AP > 65 mmHg Continue amiodarone infusion  RENAL A:   No acute issues P:   Monitor BMET intermittently Monitor I/Os Correct electrolytes as indicated   GASTROINTESTINAL A:   Duodenal ulcer - S/P EGD, S/P embolization Ileus pattern on KUB P:   SUP: IV PPI Will address nutrition with GI 11/20  HEM/ONC A:   Acute blood loss anemia Thrombocytopenia Metastatic prostate cancer P:  DVT px: SCDs Monitor CBC intermittently Transfuse per usual guidelines   INFECTIOUS A:   Doubt PNA No definite infection identified P:   Monitor temp, WBC count Micro and abx as above   ENDOCRINE A:   Stress and steroid induced hyperglycemia P:   DC methylpred CBG q 8 hrs Consider SSI for glu > 180  NEUROLOGIC A:   Myoclonus - appears to have been fentanyl induced; resolved ICU/vent associated discomfort P:   RASS goal: -1, -2 Propofol infusion Low dose PRN morphine and midazolam DC fentanyl   FAMILY: Wife, 2 sons updated @ bedside  - Updates:   CCM time: 45 mins  The above time includes time spent in consultation with patient and/or family members and reviewing care plan on multidisciplinary rounds  Merton Border, MD PCCM service Mobile [phone] Pager [phone] 01/21/2017, 6:46 PM

## 2017-01-21 NOTE — Progress Notes (Signed)
MEDICATION RELATED CONSULT NOTE - INITIAL   Pharmacy Consult for electrolyte replacement Indication: hypocalcemia, hypophosphatemia  Allergies  Allergen Reactions  . Latex Rash    Patient Measurements: Height: 6\' 1"  (185.4 cm) Weight: 241 lb (109.3 kg) IBW/kg (Calculated) : 79.9 Adjusted Body Weight: n/a  Vital Signs: Temp: 98.6 F (37 C) (11/19 0400) Temp Source: Axillary (11/19 0400) BP: 92/52 (11/19 0600) Pulse Rate: 95 (11/19 0600) Intake/Output from previous day: 11/18 0701 - 11/19 0700 In: 6957.5 [I.V.:5785.5; Blood:1172] Out: 1075 [Urine:950; Emesis/NG output:125] Intake/Output from this shift: Total I/O In: 6775.5 [I.V.:5785.5; Blood:990] Out: 575 [Urine:450; Emesis/NG output:125]  Labs: Recent Labs    01/05/2017 0812  01/05/2017 0759  01/09/2017 2043 01/20/17 0421 01/21/17 0524  WBC 7.5   < > 10.0  --  15.1* 10.7* 8.9  HGB 6.7*   < > 6.6*   < > 12.7* 11.1* 10.5*  HCT 20.6*   < > 20.0*  --  38.0* 32.7* 30.9*  PLT 87*   < > 81*  --  53* 45* 62*  APTT 43*  --   --   --  26  --   --   CREATININE 1.03   < > 1.07  --  0.95  --  0.84  MG 1.8   < > 2.4  --  2.3 2.1 2.1  PHOS 1.4*   < > 2.7  --  3.1 2.5 1.7*  ALBUMIN  --   --   --   --  2.7*  --  2.3*  PROT  --   --   --   --   --   --  4.0*  AST  --   --   --   --   --   --  16  ALT  --   --   --   --   --   --  13*  ALKPHOS  --   --   --   --   --   --  98  BILITOT  --   --   --   --   --   --  0.8   < > = values in this interval not displayed.   Estimated Creatinine Clearance: 83.4 mL/min (by C-G formula based on SCr of 0.84 mg/dL).   Microbiology: Recent Results (from the past 720 hour(s))  Blood Culture (routine x 2)     Status: None (Preliminary result)   Collection Time: 01/20/2017  4:52 AM  Result Value Ref Range Status   Specimen Description BLOOD BLOOD RIGHT WRIST  Final   Special Requests   Final    BOTTLES DRAWN AEROBIC AND ANAEROBIC Blood Culture adequate volume   Culture NO GROWTH 3 DAYS  Final    Report Status PENDING  Incomplete  Blood Culture (routine x 2)     Status: None (Preliminary result)   Collection Time: 01/03/2017  4:52 AM  Result Value Ref Range Status   Specimen Description BLOOD BLOOD RIGHT FOREARM  Final   Special Requests   Final    BOTTLES DRAWN AEROBIC AND ANAEROBIC Blood Culture adequate volume   Culture NO GROWTH 3 DAYS  Final   Report Status PENDING  Incomplete  MRSA PCR Screening     Status: None   Collection Time: 01/29/2017  8:29 AM  Result Value Ref Range Status   MRSA by PCR NEGATIVE NEGATIVE Final    Comment:        The GeneXpert MRSA Assay (FDA  approved for NASAL specimens only), is one component of a comprehensive MRSA colonization surveillance program. It is not intended to diagnose MRSA infection nor to guide or monitor treatment for MRSA infections.     Medical History: Past Medical History:  Diagnosis Date  . Atrial fibrillation ([place])   . Cancer [facility])    Prostate with bone mets-current chemo treatment  . COPD (chronic obstructive pulmonary disease) ([place])   . Gastric ulcer   . Hyperlipidemia     Medications:    Assessment: Ca 7.0 corrected, PO4 1.7  Goal of Therapy:  Electrolytes WNL   Plan:  Calcium gluconate 2 grams x1 ordered. KPO4 30 mmol x1 ordered. Recheck BMP, Mg and PO4 in AM.  Hershey Knauer S 01/21/2017,6:58 AM

## 2017-01-21 NOTE — Progress Notes (Signed)
Fentanyl titrated off this am. Patient has been less responsive to painful stimulation since fentanyl turned off. Dr Leonidas Romberg made aware during his afternoon rounds.He did receive versed during his bath this afternoon. Per Dr Leonidas Romberg he ordered this RN to turn propofol on at 10 mcg.

## 2017-01-22 ENCOUNTER — Inpatient Hospital Stay: Payer: Medicare Other

## 2017-01-22 ENCOUNTER — Encounter: Payer: Self-pay | Admitting: Interventional Radiology

## 2017-01-22 DIAGNOSIS — J810 Acute pulmonary edema: Secondary | ICD-10-CM

## 2017-01-22 DIAGNOSIS — G9340 Encephalopathy, unspecified: Secondary | ICD-10-CM

## 2017-01-22 DIAGNOSIS — K920 Hematemesis: Secondary | ICD-10-CM

## 2017-01-22 LAB — COMPREHENSIVE METABOLIC PANEL
ALBUMIN: 2.3 g/dL — AB (ref 3.5–5.0)
ALT: 14 U/L — ABNORMAL LOW (ref 17–63)
AST: 18 U/L (ref 15–41)
Alkaline Phosphatase: 111 U/L (ref 38–126)
Anion gap: 3 — ABNORMAL LOW (ref 5–15)
BILIRUBIN TOTAL: 0.8 mg/dL (ref 0.3–1.2)
BUN: 34 mg/dL — AB (ref 6–20)
CALCIUM: 6.2 mg/dL — AB (ref 8.9–10.3)
CO2: 23 mmol/L (ref 22–32)
CREATININE: 1.16 mg/dL (ref 0.61–1.24)
Chloride: 113 mmol/L — ABNORMAL HIGH (ref 101–111)
GFR calc Af Amer: >60 mL/min (ref >60)
GFR, EST NON AFRICAN AMERICAN: 56 mL/min — AB (ref >60)
GLUCOSE: 165 mg/dL — AB (ref 65–99)
Potassium: 5 mmol/L (ref 3.5–5.1)
Sodium: 139 mmol/L (ref 135–145)
TOTAL PROTEIN: 4.5 g/dL — AB (ref 6.5–8.1)

## 2017-01-22 LAB — CBC
HCT: 38.4 % — ABNORMAL LOW (ref 40.0–52.0)
HEMATOCRIT: 33.3 % — AB (ref 40.0–52.0)
Hemoglobin: 11.6 g/dL — ABNORMAL LOW (ref 13.0–18.0)
Hemoglobin: 12.5 g/dL — ABNORMAL LOW (ref 13.0–18.0)
MCH: 32.3 pg (ref 26.0–34.0)
MCH: 30.5 pg (ref 26.0–34.0)
MCHC: 34.7 g/dL (ref 32.0–36.0)
MCHC: 32.5 g/dL (ref 32.0–36.0)
MCV: 92.9 fL (ref 80.0–100.0)
MCV: 94 fL (ref 80.0–100.0)
PLATELETS: 93 10*3/uL — AB (ref 150–440)
Platelets: 73 10*3/uL — ABNORMAL LOW (ref 150–440)
RBC: 3.58 MIL/uL — ABNORMAL LOW (ref 4.40–5.90)
RBC: 4.09 MIL/uL — ABNORMAL LOW (ref 4.40–5.90)
RDW: 16.8 % — ABNORMAL HIGH (ref 11.5–14.5)
RDW: 16.8 % — ABNORMAL HIGH (ref 11.5–14.5)
WBC: 10.7 10*3/uL — ABNORMAL HIGH (ref 3.8–10.6)
WBC: 15.5 10*3/uL — ABNORMAL HIGH (ref 3.8–10.6)

## 2017-01-22 LAB — BPAM FFP
BLOOD PRODUCT EXPIRATION DATE: 201811222359
Blood Product Expiration Date: 201811222359
Blood Product Expiration Date: 201811222359
Blood Product Expiration Date: 201811222359
ISSUE DATE / TIME: 201811171621
ISSUE DATE / TIME: 201811171650
ISSUE DATE / TIME: 201811181219
UNIT TYPE AND RH: 600
Unit Type and Rh: 6200
Unit Type and Rh: 6200
Unit Type and Rh: 6200

## 2017-01-22 LAB — PREPARE FRESH FROZEN PLASMA
UNIT DIVISION: 0
UNIT DIVISION: 0
Unit division: 0
Unit division: 0

## 2017-01-22 LAB — MAGNESIUM: Magnesium: 2.4 mg/dL (ref 1.7–2.4)

## 2017-01-22 LAB — PHOSPHORUS: Phosphorus: 2 mg/dL — ABNORMAL LOW (ref 2.5–4.6)

## 2017-01-22 LAB — GLUCOSE, CAPILLARY
Glucose-Capillary: 145 mg/dL — ABNORMAL HIGH (ref 65–99)
Glucose-Capillary: 157 mg/dL — ABNORMAL HIGH (ref 65–99)
Glucose-Capillary: 162 mg/dL — ABNORMAL HIGH (ref 65–99)

## 2017-01-22 MED ORDER — METOPROLOL TARTRATE 25 MG PO TABS: 25.0000 mg | ORAL_TABLET | Freq: Two times a day (BID) | ORAL | Status: DC

## 2017-01-22 MED ORDER — POLYETHYLENE GLYCOL 3350 17 G PO PACK: 17.0000 g | PACK | Freq: Two times a day (BID) | ORAL | Status: DC

## 2017-01-22 MED ORDER — DEXMEDETOMIDINE HCL IN NACL 400 MCG/100ML IV SOLN
0.6000 ug/kg/h | INTRAVENOUS | Status: DC
  Administered 2017-01-22: 0.4 ug/kg/h via INTRAVENOUS
  Administered 2017-01-23: 0.6 ug/kg/h via INTRAVENOUS
  Administered 2017-01-23: 1 ug/kg/h via INTRAVENOUS
  Administered 2017-01-23: 1.8 ug/kg/h via INTRAVENOUS
  Administered 2017-01-23: 0.7 ug/kg/h via INTRAVENOUS
  Administered 2017-01-23: 0.8 ug/kg/h via INTRAVENOUS
  Administered 2017-01-23 – 2017-01-24 (×3): 1.8 ug/kg/h via INTRAVENOUS
  Administered 2017-01-24: 2 ug/kg/h via INTRAVENOUS
  Administered 2017-01-24: 1 ug/kg/h via INTRAVENOUS
  Administered 2017-01-24: 1.8 ug/kg/h via INTRAVENOUS
  Administered 2017-01-24: 1.4 ug/kg/h via INTRAVENOUS
  Administered 2017-01-25: 1.8 ug/kg/h via INTRAVENOUS
  Administered 2017-01-25: 1.4 ug/kg/h via INTRAVENOUS
  Administered 2017-01-25: 2 ug/kg/h via INTRAVENOUS
  Administered 2017-01-25: 1.6 ug/kg/h via INTRAVENOUS
  Administered 2017-01-25: 1.8 ug/kg/h via INTRAVENOUS
  Administered 2017-01-25: 1.4 ug/kg/h via INTRAVENOUS
  Administered 2017-01-25: 2 ug/kg/h via INTRAVENOUS
  Administered 2017-01-25: 1.4 ug/kg/h via INTRAVENOUS
  Administered 2017-01-25: 2 ug/kg/h via INTRAVENOUS
  Administered 2017-01-26 (×4): 1.4 ug/kg/h via INTRAVENOUS
  Filled 2017-01-22 (×28): qty 100

## 2017-01-22 MED ORDER — METOPROLOL TARTRATE 5 MG/5ML IV SOLN: 2.5000 mg | INTRAVENOUS | Status: DC | PRN

## 2017-01-22 MED ORDER — METOPROLOL TARTRATE 25 MG PO TABS
25.0000 mg | ORAL_TABLET | Freq: Two times a day (BID) | ORAL | Status: DC
  Administered 2017-01-22: 25 mg
  Filled 2017-01-22: qty 1

## 2017-01-22 MED ORDER — FERROUS SULFATE 75 (15 FE) MG/ML PO SOLN
325.0000 mg | Freq: Every day | ORAL | Status: DC
  Administered 2017-01-22: 325 mg via ORAL
  Filled 2017-01-22 (×2): qty 4.3

## 2017-01-22 MED ORDER — ACETAMINOPHEN 650 MG RE SUPP: 650.0000 mg | Freq: Four times a day (QID) | RECTAL | Status: DC | PRN

## 2017-01-22 MED ORDER — FUROSEMIDE 10 MG/ML IJ SOLN
40.0000 mg | Freq: Once | INTRAMUSCULAR | Status: AC
  Administered 2017-01-22: 40 mg via INTRAVENOUS
  Filled 2017-01-22: qty 4

## 2017-01-22 MED ORDER — ACETAMINOPHEN 325 MG PO TABS: 650.0000 mg | ORAL_TABLET | Freq: Four times a day (QID) | ORAL | Status: DC | PRN

## 2017-01-22 MED ORDER — DIGOXIN 250 MCG PO TABS
0.2500 mg | ORAL_TABLET | Freq: Every day | ORAL | Status: DC
  Filled 2017-01-22: qty 1

## 2017-01-22 MED ORDER — DOCUSATE SODIUM 50 MG/5ML PO LIQD
100.0000 mg | Freq: Two times a day (BID) | ORAL | Status: DC
  Administered 2017-01-22: 100 mg
  Filled 2017-01-22: qty 10

## 2017-01-22 MED ORDER — MIRTAZAPINE 15 MG PO TABS: 15.0000 mg | ORAL_TABLET | Freq: Every day | ORAL | Status: DC

## 2017-01-22 NOTE — Progress Notes (Signed)
MD made aware of low resp rate picking up on monitor, however patient's other vitals including O2 sats are within normal limits. Manual count is approx 8 to per minute with a mixture or deep and shallow breaths. Patient in no acute distress and appears relaxed.  Per MD, turn O2 down from 4.5 liters to 2liters nasal cannula and keep O2 sats above 89%, Per MD patient is breathing via cheyne stokes pattern.

## 2017-01-22 NOTE — Progress Notes (Signed)
Patient and family declined bath at this time and patient has been agitated since extubation. Patient finally calm and resting. Family would like patient to continue to rest.

## 2017-01-22 NOTE — Progress Notes (Signed)
Lab called regarding calcium 6.2, Hinton Dyer, NP notified

## 2017-01-22 NOTE — Progress Notes (Signed)
[place] at [place] NAME: Chad Kirby    MR#:  [id number]  DATE OF BIRTH:  08-18-1929  SUBJECTIVE:  CHIEF COMPLAINT:   Chief Complaint  Patient presents with  . Shortness of Breath  . Rectal Bleeding  Of sedation, remains on ventilator, all family at bedside REVIEW OF SYSTEMS:  Review of Systems  Unable to perform ROS: Critical illness   DRUG ALLERGIES:   Allergies  Allergen Reactions  . Fentanyl     Myoclonus  . Latex Rash   VITALS:  Blood pressure (!) 150/78, pulse (!) 119, temperature 97.9 F (36.6 C), temperature source Axillary, resp. rate (!) 8, height 6\' 1"  (1.854 m), weight 118.3 kg (260 lb 12.9 oz), SpO2 97 %. PHYSICAL EXAMINATION:  Physical Exam  Constitutional: He is well-developed, well-nourished, and in no distress.  HENT:  Head: Normocephalic and atraumatic.  ETT in place  Eyes: Conjunctivae and EOM are normal. Pupils are equal, round, and reactive to light.  Neck: Normal range of motion. Neck supple. No tracheal deviation present. No thyromegaly present.  Cardiovascular: Normal rate, regular rhythm and normal heart sounds.  Pulmonary/Chest: Effort normal and breath sounds normal. No respiratory distress. He has no wheezes. He exhibits no tenderness.  Abdominal: Soft. Bowel sounds are normal. He exhibits no distension. There is no tenderness.  Musculoskeletal: Normal range of motion. He exhibits edema.  Neurological: No cranial nerve deficit.  Moving around and opening his eyes voluntarily, not following any commands  Skin: Skin is warm and dry. No rash noted.  Psychiatric:  On vent   LABORATORY PANEL:  Male CBC Recent Labs  Lab 01/22/17 1245  WBC 15.5*  HGB 12.5*  HCT 38.4*  PLT 93*   ------------------------------------------------------------------------------------------------------------------ Chemistries  Recent Labs  Lab 01/22/17 0430  NA 139  K 5.0  CL 113*  CO2 23  GLUCOSE 165*    BUN 34*  CREATININE 1.16  CALCIUM 6.2*  MG 2.4  AST 18  ALT 14*  ALKPHOS 111  BILITOT 0.8   RADIOLOGY:  Dg Abd 1 View  Result Date: 01/22/2017 CLINICAL DATA:  Ileus. EXAM: ABDOMEN - 1 VIEW COMPARISON:  Two days ago FINDINGS: Gaseous distention of colon and small bowel that is less extensive than prior. The stomach has been decompressed. Known sclerotic bony metastases. No concerning mass effect or gas collection. Hemo clip and embolization coils. IMPRESSION: History of ileus with less gaseous distention than 2 days prior. No indication of bowel obstruction. Electronically Signed   By: Monte Fantasia M.D.   On: 01/22/2017 08:02   Dg Chest Port 1 View  Result Date: 01/22/2017 CLINICAL DATA:  Respiratory failure and ileus. EXAM: PORTABLE CHEST 1 VIEW COMPARISON:  Three days ago FINDINGS: Haziness of the bilateral lower chest compatible with layering pleural effusions, increased. Cardiomegaly and vascular pedicle widening. Endotracheal tube tip just below the clavicular heads. An orogastric tube reaches the stomach, which has been decompressed. Left IJ central line with tip near the SVC origin. Sclerotic bones this patient with history of known prostate cancer with metastases. IMPRESSION: Layering pleural effusions/atelectasis, potentially moderate, increased from 3 days ago. Unremarkable tube and line placement. Electronically Signed   By: Monte Fantasia M.D.   On: 01/22/2017 08:00   ASSESSMENT AND PLAN:  71 M with metastatic prostate cancer admitted with dyspnea and hematochezia. Had progressive blood loss anemia.   * Acute respiratory failure secondary to COPD exacerbation:  - elective intubation for EGD on 11/17 -  vent mgmt per PCCM -likely plan for extubation later today, he is off sedation  * Acute blood loss anemia secondary to GI bleed secondary to bleeding peptic ulcer.  Status post mesenteric angiogram, embolization.   - EGD showing One cratered duodenal ulcer with a visible  vessel was found in the duodenal bulb s/p embolization -  Stopped anticoagulation due to GI bleed, patient received FFP, vitamin K,  3 packed RBC transfusion.   * Sepsis: ruled out, stopped all Abx  *  Metastatic prostate cancer: Patient has diffuse skeletal metastases.  Getting treatment at [facility].   * atrial fibrillation with RVR: On amiodarone drip.         All the records are reviewed and case discussed with Care Management/Social Worker. Management plans discussed with the patient, family, PCCM and they are in agreement.  CODE STATUS: DNR  TOTAL TIME TAKING CARE OF THIS PATIENT: 25 minutes.   More than 50% of the time was spent in counseling/coordination of care: YES  POSSIBLE D/C IN 2-3 DAYS, DEPENDING ON CLINICAL CONDITION.   Max Sane M.D on 01/22/2017 at 3:06 PM  Between 7am to 6pm - Pager - [phone]  After 6pm go to [email or web address] - Proofreader  [employer] [facility]  Office  [phone]  CC: Primary care physician; [facility]  Note: This dictation was prepared with Dragon dictation along with smaller phrase technology. Any transcriptional errors that result from this process are unintentional.

## 2017-01-22 NOTE — Progress Notes (Signed)
Extubated to 4 lpm O2 Advance

## 2017-01-22 NOTE — Plan of Care (Signed)
Propofol and IV fluids stopped today, patient received 40mg  of IV lasix per MD order. Patient extubated today per MD, tolerating [place] well with O2 sats maintaining 94-99% on 4.5L via nasal cannula. Patient experiencing delirium and agitation post extubation, started on precedex drip by MD, tolerating well at this time. Patient did not appear to be in any and denied any pain when asked. OG tube removed with extubation. Patient repositioned as tolerated this shift. Family refused bath at this time, asked that patient be allowed to rest after eventful shift. Dressing changed to left lower left. Patient continuously weeping from extremities. Support provided for patient and family during shift.

## 2017-01-22 NOTE — Consult Note (Signed)
[facility] Cardiology  CARDIOLOGY CONSULT NOTE  Patient ID: Chad Kirby MRN: [id number] DOB/AGE: [birth year] 83 y.o.  Admit date: 01/21/2017 Referring Physician Manuella Ghazi Primary Physician [facility] Primary Primary Cardiologist Taylie Helder Reason for Consultation atrial fibrillation  HPI: 83 year old gentleman referred for atrial fibrillation.  Patient has known paroxysmal atrial fibrillation on warfarin for stroke prevention.  He is admitted on 01/04/2017 with exertional dyspnea, fatigue, and GI bleed with a hemoglobin of 7.3.  Patient was transfused 2 units of packed red blood cells.  The patient underwent GI workup which included EGD which revealed duodenal ulcer treated with coagulation.  The patient also underwent mesenteric arteriogram with embolization.  The patient was electively intubated and remains on ventilator support.  Patient is atrial fibrillation, rate controlled on amiodarone drip.  Of note, admission INR was 1.46.  Review of systems complete and found to be negative unless listed above     Past Medical History:  Diagnosis Date  . Atrial fibrillation ([place])   . Cancer [facility])    Prostate with bone mets-current chemo treatment  . COPD (chronic obstructive pulmonary disease) ([place])   . Gastric ulcer   . Hyperlipidemia     Past Surgical History:  Procedure Laterality Date  . COLONOSCOPY WITH PROPOFOL N/A 05/18/2015   Performed by Hulen Luster, MD at [place]  . ESOPHAGOGASTRODUODENOSCOPY (EGD) N/A 05/19/2015   Performed by Hulen Luster, MD at [place]  . ESOPHAGOGASTRODUODENOSCOPY (EGD) WITH PROPOFOL N/A 01/10/2017   Performed by Virgel Manifold, MD at [place]  . ESOPHAGOGASTRODUODENOSCOPY (EGD) WITH PROPOFOL N/A 01/06/2017   Performed by Efrain Sella, MD at [place]  . ESOPHAGOGASTRODUODENOSCOPY (EGD) WITH PROPOFOL N/A 06/02/2015   Performed by Lollie Sails, MD at [place]  . IR HYBRID TRAUMA EMBOLIZATION  01/21/2017  . REPLACEMENT  TOTAL KNEE Right     Medications Prior to Admission  Medication Sig Dispense Refill Last Dose  . atorvastatin (LIPITOR) 80 MG tablet Take 80 mg by mouth at bedtime.   unknown at unknown  . budesonide-formoterol (SYMBICORT) 80-4.5 MCG/ACT inhaler Inhale 2 puffs into the lungs 2 (two) times daily.   unknown at unknown  . docusate sodium (COLACE) 100 MG capsule Take 1 capsule (100 mg total) 2 (two) times daily as needed by mouth for mild constipation. 10 capsule 0 unknown at unknown  . ferrous sulfate 325 (65 FE) MG EC tablet Take 1 tablet (325 mg total) 2 (two) times daily by mouth. 60 tablet 3 unknown at unknown  . metoprolol succinate (TOPROL-XL) 25 MG 24 hr tablet Take 25 mg by mouth daily.   unknowm at unknown  . pantoprazole (PROTONIX) 40 MG tablet Take 1 tablet (40 mg total) 2 (two) times daily by mouth. 60 tablet 0 unknown at unknown  . prochlorperazine (COMPAZINE) 10 MG tablet Take 10 mg by mouth every 6 (six) hours as needed. For nausea and vomiting.   unknown at unknown  . senna-docusate (SENOKOT-S) 8.6-50 MG tablet Take 1-2 tablets 2 (two) times daily as needed by mouth for mild constipation.   unknown at unknown  . tiotropium (SPIRIVA) 18 MCG inhalation capsule Place 18 mcg into inhaler and inhale daily.   unknown at unknown  . warfarin (COUMADIN) 3 MG tablet Take 1 tablet (3 mg total) daily by mouth. 2 tablet 0 unknown at unknown  . digoxin (LANOXIN) 0.25 MG tablet Take 1 tablet (0.25 mg total) by mouth daily. (Patient not taking: Reported on 01/15/2017) 30 tablet 0 Not  Taking at Unknown time  . feeding supplement, ENSURE ENLIVE, (ENSURE ENLIVE) LIQD Take 237 mLs by mouth 3 (three) times daily with meals. 237 mL 12   . gabapentin (NEURONTIN) 300 MG capsule Take 1 capsule (300 mg total) by mouth 3 (three) times daily. (Patient not taking: Reported on 01/04/2017) 60 capsule 0 Not Taking at Unknown time  . polyethylene glycol (MIRALAX / GLYCOLAX) packet Take 17 g by mouth 2 (two) times daily.  (Patient not taking: Reported on 01/14/2017) 14 each 0 Not Taking at Unknown time   Social History   Socioeconomic History  . Marital status: Married    Spouse name: Not on file  . Number of children: Not on file  . Years of education: Not on file  . Highest education level: Not on file  Social Needs  . Financial resource strain: Not on file  . Food insecurity - worry: Not on file  . Food insecurity - inability: Not on file  . Transportation needs - medical: Not on file  . Transportation needs - non-medical: Not on file  Occupational History  . Not on file  Tobacco Use  . Smoking status: Former Research scientist (life sciences)  . Smokeless tobacco: Never Used  Substance and Sexual Activity  . Alcohol use: Yes    Alcohol/week: 0.0 oz    Comment: occasional  . Drug use: Not on file  . Sexual activity: Not on file  Other Topics Concern  . Not on file  Social History Narrative  . Not on file    Family History  Problem Relation Age of Onset  . Brain cancer Sister   . Stroke Father       Review of systems complete and found to be negative unless listed above      PHYSICAL EXAM  General: Well developed, well nourished, in no acute distress HEENT:  Normocephalic and atramatic Neck:  No JVD.  Lungs: Clear bilaterally to auscultation and percussion. Heart: HRRR . Normal S1 and S2 without gallops or murmurs.  Abdomen: Bowel sounds are positive, abdomen soft and non-tender  Msk:  Back normal, normal gait. Normal strength and tone for age. Extremities: No clubbing, cyanosis or edema.   Neuro: Alert and oriented X 3. Psych:  Good affect, responds appropriately  Labs:   Lab Results  Component Value Date   WBC 10.7 (H) 01/22/2017   HGB 11.6 (L) 01/22/2017   HCT 33.3 (L) 01/22/2017   MCV 92.9 01/22/2017   PLT 73 (L) 01/22/2017    Recent Labs  Lab 01/22/17 0430  NA 139  K 5.0  CL 113*  CO2 23  BUN 34*  CREATININE 1.16  CALCIUM 6.2*  PROT 4.5*  BILITOT 0.8  ALKPHOS 111  ALT 14*   AST 18  GLUCOSE 165*   Lab Results  Component Value Date   TROPONINI <0.03 01/16/2017   No results found for: CHOL No results found for: HDL No results found for: [facility] Lab Results  Component Value Date   TRIG 146 01/21/2017   No results found for: CHOLHDL No results found for: LDLDIRECT    Radiology: Ct Abdomen Pelvis Wo Contrast  Result Date: 01/08/2017 CLINICAL DATA:  Abdominal distention and low hemoglobin. History of prostate cancer. EXAM: CT ABDOMEN AND PELVIS WITHOUT CONTRAST TECHNIQUE: Multidetector CT imaging of the abdomen and pelvis was performed following the standard protocol without IV contrast. COMPARISON:  CTA chest 01/24/2017 CT abdomen pelvis 05/04/2015 FINDINGS: Lower chest: Small bilateral pleural effusions with associated atelectasis. Cardiomegaly. Hepatobiliary:  Normal hepatic contours and density. No visible biliary dilatation. Normal gallbladder. Pancreas: Mild pancreatic atrophy, likely senescent. Spleen: Normal. Adrenals/Urinary Tract: --Adrenal glands: Normal. --Right kidney/ureter: Hyperdensities in multiple areas of the collecting system, favored to be due to recent IV contrast administration. --Left kidney/ureter: 7 mm left upper pole calculus. The other hyperdensities in the collecting system are likely due to recent contrast administration. --Urinary bladder: Opacification of the urinary bladder secondary to recent contrast administration for CTA chest. Stomach/Bowel: --Stomach/Duodenum: Patulous lower esophagus. Normal stomach and duodenum. --Small bowel: No dilatation or inflammation. --Colon: No focal abnormality. --Appendix: Not visualized. No right lower quadrant inflammation or free fluid. Vascular/Lymphatic: There is atherosclerotic calcification of the non aneurysmal abdominal aorta. There is also calcification of the major branch vessels of the aorta. Normal course and caliber of the major abdominal vessels. No abdominal or pelvic lymphadenopathy.  Reproductive: Normal size of the prostate gland with multiple calcifications in the left peripheral zone. Musculoskeletal. Extensive sclerotic metastatic disease of the thoracolumbar spine and pelvis, progressed relative to the study of 05/04/2015. No pathologic fracture or bony spinal canal stenosis. There is moderate to severe neural foraminal stenosis at L4-5 and L5-S1. There also multiple sclerotic metastases within the visible portions of the ribs. Other: None. IMPRESSION: 1. No acute abdominopelvic abnormality. 2. Small bilateral pleural effusions and moderate cardiomegaly. 3. Severe, extensive sclerotic metastatic disease of the pelvis, ribs and thoracolumbar spine, progressed relative to 05/04/2015. No pathologic fracture. 4.  Aortic Atherosclerosis (ICD10-I70.0). 5. Hyperdensities within both renal collecting systems and urinary bladder are secondary to recent IV contrast administration for CTA chest. Electronically Signed   By: Ulyses Jarred M.D.   On: 01/10/2017 13:57   Dg Abd 1 View  Result Date: 01/22/2017 CLINICAL DATA:  Ileus. EXAM: ABDOMEN - 1 VIEW COMPARISON:  Two days ago FINDINGS: Gaseous distention of colon and small bowel that is less extensive than prior. The stomach has been decompressed. Known sclerotic bony metastases. No concerning mass effect or gas collection. Hemo clip and embolization coils. IMPRESSION: History of ileus with less gaseous distention than 2 days prior. No indication of bowel obstruction. Electronically Signed   By: Monte Fantasia M.D.   On: 01/22/2017 08:02   Dg Abd 1 View  Result Date: 01/20/2017 CLINICAL DATA:  Orogastric tube placement. EXAM: ABDOMEN - 1 VIEW COMPARISON:  Abdominal radiograph performed 02/01/2017 FINDINGS: The patient's enteric tube is noted ending overlying the body of the stomach. There is diffuse distention of the stomach and small bowel, and air is also noted within the colon. This may reflect ileus, as previously noted. No free  intra-abdominal air is seen, though evaluation for free air is limited on a single supine view. There is diffuse sclerosis within the visualized osseous structures. Postoperative change is noted at the right mid abdomen. IMPRESSION: Enteric tube noted ending overlying the body of the stomach. Electronically Signed   By: Garald Balding M.D.   On: 01/20/2017 00:27   Dg Abd 1 View  Result Date: 01/03/2017 CLINICAL DATA:  Initial evaluation for abdominal distension. EXAM: ABDOMEN - 1 VIEW COMPARISON:  Prior radiograph from earlier the same day. FINDINGS: Enteric tube remains in place with tip near the GE junction. Pronounced gaseous distension of the stomach is similar to previous, suspected to at least in part be secondary to insufflation from recent EGD. Multiple dilated gas-filled loops of small bowel seen throughout the abdomen measuring up to 4.1 cm in diameter. Mild gaseous distension of large bowel as well. Diffuse  pattern favors ileus. Now seen are embolization coils at the right paramedian abdomen. Adjacent surgical clips. Bibasilar opacities present within the partially visualized lung bases, which may reflect atelectasis or infiltrates. Prominent degenerative changes noted within the lower lumbar spine about the hips bilaterally. IMPRESSION: 1. Prominent gaseous distension of the stomach, similar to previous, suspected to at least in part be secondary to insufflation from recent EGD. 2. Multiple additional mildly prominent gas-filled loops scattered throughout the bowel. Diffuse pattern favors ileus. 3. Sequelae of interval embolization with coils overlying the right paramedian abdomen. Electronically Signed   By: Jeannine Boga M.D.   On: 01/04/2017 23:31   Dg Abd 1 View  Result Date: 01/22/2017 CLINICAL DATA:  OG tube placement EXAM: ABDOMEN - 1 VIEW COMPARISON:  Chest x-ray performed today.  Plain films 01/06/2017 FINDINGS: Significant gaseous distention of the stomach. OG tube tip is in  the upper abdomen near the GE junction. This could be advanced several cm. IMPRESSION: OG tube tip near the GE junction. Significant gaseous distention of the stomach. Electronically Signed   By: Rolm Baptise M.D.   On: 01/11/2017 12:06   Dg Abd 1 View  Result Date: 01/14/2017 CLINICAL DATA:  Nausea and vomiting EXAM: ABDOMEN - 1 VIEW COMPARISON:  CT abdomen pelvis 01/08/2017 FINDINGS: There is diffuse sclerotic metastatic disease. The bladder is opacified by excreted contrast material. Mildly dilated small bowel in the left mid abdomen but no other evidence of small-bowel obstruction. IMPRESSION: Diffuse sclerotic metastatic disease. No radiographic evidence of small-bowel obstruction. Electronically Signed   By: Ulyses Jarred M.D.   On: 01/30/2017 21:51   Ct Angio Chest Pe W And/or Wo Contrast  Result Date: 01/25/2017 CLINICAL DATA:  Acute onset of difficulty breathing. Current history of prostate cancer. Hematochezia. EXAM: CT ANGIOGRAPHY CHEST WITH CONTRAST TECHNIQUE: Multidetector CT imaging of the chest was performed using the standard protocol during bolus administration of intravenous contrast. Multiplanar CT image reconstructions and MIPs were obtained to evaluate the vascular anatomy. CONTRAST:  82mL ISOVUE-370 IOPAMIDOL (ISOVUE-370) INJECTION 76% COMPARISON:  Chest radiograph performed earlier today at 4:50 a.m., and CT of the chest performed 06/19/2004 FINDINGS: Cardiovascular: There is no evidence of pulmonary embolus. Evaluation is suboptimal in areas of airspace consolidation and scarring. The heart is mildly enlarged. Diffuse coronary artery calcifications are seen. Scattered calcification is noted along the thoracic aorta and proximal great vessels. Mediastinum/Nodes: The mediastinum is grossly unremarkable in appearance. No mediastinal lymphadenopathy is seen. No pericardial effusion is identified. The thyroid gland is unremarkable. No axillary lymphadenopathy is appreciated.  Lungs/Pleura: Trace bilateral pleural effusions are noted. Mild left basilar airspace opacity likely reflects atelectasis, though pneumonia might have a similar appearance. No pneumothorax is seen. No masses are identified. A few blebs are noted at the right lung apex. Upper Abdomen: The visualized portions of the liver and spleen are unremarkable. The visualized portions of the adrenal glands are within normal limits. Musculoskeletal: Diffuse sclerosis throughout the visualized osseous structures reflects metastatic disease from the patient's prostate cancer. The visualized musculature is unremarkable in appearance. Review of the MIP images confirms the above findings. IMPRESSION: 1. No evidence of pulmonary embolus. 2. Trace bilateral pleural effusions. Mild left basilar airspace opacity likely reflects atelectasis, though pneumonia might have a similar appearance. 3. Diffuse sclerosis throughout the visualized osseous structures reflects metastatic disease from the patient's prostate cancer. 4. Diffuse coronary artery calcifications seen.  Mild cardiomegaly. Electronically Signed   By: Garald Balding M.D.   On: 01/03/2017 06:55  Dg Chest Port 1 View  Result Date: 01/22/2017 CLINICAL DATA:  Respiratory failure and ileus. EXAM: PORTABLE CHEST 1 VIEW COMPARISON:  Three days ago FINDINGS: Haziness of the bilateral lower chest compatible with layering pleural effusions, increased. Cardiomegaly and vascular pedicle widening. Endotracheal tube tip just below the clavicular heads. An orogastric tube reaches the stomach, which has been decompressed. Left IJ central line with tip near the SVC origin. Sclerotic bones this patient with history of known prostate cancer with metastases. IMPRESSION: Layering pleural effusions/atelectasis, potentially moderate, increased from 3 days ago. Unremarkable tube and line placement. Electronically Signed   By: Monte Fantasia M.D.   On: 01/22/2017 08:00   Dg Chest Port 1  View  Result Date: 01/07/2017 CLINICAL DATA:  ET tube and central line placement EXAM: PORTABLE CHEST 1 VIEW COMPARISON:  01/08/2017 FINDINGS: Endotracheal tube is approximately 4.5 cm above the carina. Left central line tip in the upper SVC. No pneumothorax. NG tube tip near the GE junction with gaseous distention of the stomach. Cardiomegaly. Aortic calcifications. Elevation of the left hemidiaphragm with left base atelectasis. Mild vascular congestion. IMPRESSION: Support devices as above. NG tube tip is near the GE junction and could be advanced slightly. Gaseous distention of the stomach. Cardiomegaly, vascular congestion. Elevation of the left hemidiaphragm with left base atelectasis. Electronically Signed   By: Rolm Baptise M.D.   On: 01/23/2017 12:05   Dg Chest Port 1 View  Result Date: 01/14/2017 CLINICAL DATA:  Shortness of breath. EXAM: PORTABLE CHEST 1 VIEW COMPARISON:  01/30/2017 FINDINGS: Examination limited by significant overlying artifact. The heart is enlarged but stable. Stable tortuosity, ectasia and calcification of the thoracic aorta. Stable eventration of the left hemidiaphragm. No definite infiltrates or effusions. Streaky bibasilar atelectasis. Diffuse sclerotic metastatic bone disease. IMPRESSION: 1. Cardiac enlargement but no definite pulmonary edema. 2. Bibasilar atelectasis. Electronically Signed   By: Marijo Sanes M.D.   On: 01/09/2017 09:16   Dg Chest Port 1 View  Result Date: 01/04/2017 CLINICAL DATA:  Acute onset of shortness of breath and cough. EXAM: PORTABLE CHEST 1 VIEW COMPARISON:  Chest radiograph performed 01/04/2017 FINDINGS: There is elevation of the left hemidiaphragm. Mild right basilar airspace opacity may reflect pneumonia. No significant pleural effusion or pneumothorax is seen. The cardiomediastinal silhouette is borderline normal in size. No acute osseous abnormalities are seen. IMPRESSION: Elevation of the left hemidiaphragm. Mild right basilar airspace  opacity may reflect pneumonia. Electronically Signed   By: Garald Balding M.D.   On: 01/23/2017 05:27   Dg Chest Port 1 View  Result Date: 01/04/2017 CLINICAL DATA:  NT EXAM: PORTABLE CHEST 1 VIEW COMPARISON:  Chest radiograph 05/21/2015 FINDINGS: Cardiomegaly is unchanged. The left hemidiaphragm remains elevated. There is bibasilar atelectasis. No focal airspace consolidation or pulmonary edema. No pneumothorax or sizable pleural effusion. Previously demonstrated sclerotic mid to lower thoracic vertebral bodies are poorly visualized on the current study due to technical factors. IMPRESSION: Cardiomegaly and unchanged elevation of the left hemidiaphragm with bibasilar atelectasis. No acute cardiopulmonary disease. Electronically Signed   By: Ulyses Jarred M.D.   On: 01/04/2017 06:13   Ir Hybrid Trauma Embolization  Result Date: 01/22/2017 CLINICAL DATA:  Hemodynamically significant duodenal bleed, incompletely controlled with endoscopic approach. EXAM: TWO-VESSEL MESENTERIC ARTERIOGRAM COIL EMBOLIZATION OF THE GASTRODUODENAL ARTERY ANESTHESIA/SEDATION: Intravenous Fentanyl and Versed were administered as conscious sedation during continuous monitoring of the patient's level of consciousness and physiological / cardiorespiratory status by the radiology RN, with a total moderate sedation time of  60 minutes. MEDICATIONS: Lidocaine 1% subcutaneous CONTRAST:  29mL ISOVUE-300 IOPAMIDOL (ISOVUE-300) INJECTION 61% PROCEDURE: The procedure, risks (including but not limited to bleeding, infection, organ damage ), benefits, and alternatives were explained to the family. The patient had already been intubated. Questions regarding the procedure were encouraged and answered. The family understands and consents to the procedure. Right femoral region prepped and draped in usual sterile fashion. Maximal barrier sterile technique was utilized including caps, mask, sterile gowns, sterile gloves, sterile drape, hand hygiene  and skin antiseptic. The right common femoral artery was localized under ultrasound. Under real-time ultrasound guidance, the vessel was accessed with a 21-gauge micropuncture needle, exchanged over a 018 guidewire for a transitional dilator, through which a 035 guidewire was advanced. Over this, a 5 [place] vascular sheath was placed, through which a 5 [place] C2 catheter was advanced and used to selectively catheterize the celiac axis for selective arteriography. With the aid of an angled Glidewire, the CT was advanced into the common hepatic artery for additional selective arteriography. The gastroduodenal artery was ocal localize the, directly supplying the region of visible endoscopic clips. No active hemorrhage was confirmed. However, A coaxial microcatheter was advanced with the aid of a 018 wire into the gastroduodenal artery. The GDA was embolized with 4 in 5 mm coils Ruby coils. Confirmatory arteriogram from the common hepatic artery catheter showed cessation of flow into the GDA and its branches. Proper hepatic artery branches remain patent. The catheter was then manipulated into the proximal superior mesenteric artery for selective arteriography. No significant retrograde filling of the GDA was identified. No evidence of hemorrhage or other vascular lesion. The catheter was then removed. After confirmatory femoral arteriogram through the sheath, the sheath was removed and hemostasis achieved with the aid of the StarClose device. The patient tolerated the procedure well. COMPLICATIONS: None immediate FINDINGS: The gastroduodenal artery branches directly supplied the region of the duodenum marked with the endoscopic clips. Technically successful coil embolization of the gastroduodenal artery and branches. No significant retrograde filling from superior mesenteric artery branches. IMPRESSION: 1. Technically successful coil embolization of gastroduodenal artery supply to the proximal duodenum bleeding region  marked by endoscopic clips. Electronically Signed   By: Lucrezia Europe M.D.   On: 01/22/2017 08:14    EKG: Atrial fibrillation  ASSESSMENT   1.  Atrial fibrillation, rate controlled on amiodarone drip 2.  GI bleed, secondary to duodenal ulcer, status post coagulation and embolization  Recommendations  1.  Agree with current therapy 2.  Continue to hold Coumadin for now 3.  Continue amiodarone drip for rate control until patient extubated  Signed: Isaias Cowman MD,PhD, [facility] 01/22/2017, 10:20 AM

## 2017-01-22 NOTE — Progress Notes (Signed)
PULMONARY / CRITICAL CARE MEDICINE   Name: Chad Kirby MRN: [id number] DOB: 27-May-1929    ADMISSION DATE:  01/23/2017  PT PROFILE:   64 M with metastatic prostate cancer treated at [facility].  Admitted to SDU 01/06/2017 with dyspnea and hematochezia.  Initially treated as a severe sepsis with unclear source.  Had progressive blood loss anemia.  Underwent intubation for EGD 11/17.   MAJOR EVENTS/TEST RESULTS: 11/16 admitted as above with dyspnea and hematochezia 11/17 elective intubation for EGD 11/17 EGD: One cratered duodenal ulcer with a visible vessel was found in the duodenal bulb. The lesion was 3 mm in largest dimension. For hemostasis,   four hemostatic clips were successfully placed. Area was successfully injected with of a 1:10,000 solution of epinephrine for hemostasis. Coagulation for hemostasis using bipolar probe was successful. IR consulted for embolization, as large cratered ulcer with deep large visible vessel is at high risk of rebleeding 11/17 IR procedure: Mesenteric arteriogram, GDA embolization 11/18 KUB: diffuse distention of the stomach and small bowel, and air also noted within the colon 11/19 Requiring PEEP 8 and FiO2 45%. No weaning of vent. Weaned off of vasopressors. Myoclonic-like activity resolved with discontinuation of fentanyl infusion and transition to propofol.  11/20 passed SBT.  All sedation discontinued.  Took extended time to "wake up".  Extubated.  Remains encephalopathic post extubation but appears to be tolerating.  INDWELLING DEVICES:: ETT 11/17 >> 11/20 L IJ CVL 11/17 >>   MICRO DATA: MRSA PCR 11/16 >> NEG Blood 11/16 >> NEG   ANTIMICROBIALS:  Vanc 11/16 >> 11/19 Pip-tazo 11/16 >> 11/19    SUBJECTIVE:  RASS -1. Not F/C consistently  VITAL SIGNS: BP (!) 145/81 (BP Location: Left Arm)   Pulse (!) 124   Temp 97.9 F (36.6 C) (Axillary)   Resp 12   Ht 6\' 1"  (1.854 m)   Wt 118.3 kg (260 lb 12.9 oz)   SpO2 98%   BMI 34.41 kg/m    HEMODYNAMICS:    VENTILATOR SETTINGS: Vent Mode: PSV FiO2 (%):  [40 %-50 %] 40 % Set Rate:  [16 bmp] 16 bmp Vt Set:  [500 mL] 500 mL PEEP:  [5 cmH20-8 cmH20] 5 cmH20 Plateau Pressure:  [20 cmH20] 20 cmH20  INTAKE / OUTPUT: I/O last 3 completed shifts: In: 8189.5 [I.V.:7199.5; Blood:990] Out: 1295 [TIRWE:3154; Emesis/NG output:225]  PHYSICAL EXAMINATION: General: Intubated, sedated, RASS -1, not F/C Neuro: PERRL, EOMI, MAEs HEENT: NCAT, alopecia Cardiovascular: IRIR, mildly tachy, no M noted Lungs: clear anteriorly Abdomen: mildlt distended, diminished BS Ext: Symmetric pretibial edema  LABS:  BMET Recent Labs  Lab 01/23/2017 2043 01/21/17 0524 01/22/17 0430  NA 135 139 139  K 4.6 3.5 5.0  CL 109 112* 113*  CO2 19* 19* 23  BUN 25* 26* 34*  CREATININE 0.95 0.84 1.16  GLUCOSE 186* 180* 165*    Electrolytes Recent Labs  Lab 01/24/2017 2043 01/20/17 0421 01/21/17 0524 01/22/17 0430  CALCIUM 6.3*  --  5.6* 6.2*  MG 2.3 2.1 2.1 2.4  PHOS 3.1 2.5 1.7* 2.0*    CBC Recent Labs  Lab 01/21/17 0951 01/22/17 0430 01/22/17 1245  WBC 12.0* 10.7* 15.5*  HGB 11.8* 11.6* 12.5*  HCT 35.5* 33.3* 38.4*  PLT 77* 73* 93*    Coag's Recent Labs  Lab 01/28/2017 0812 01/12/2017 2158 01/21/2017 0759 01/16/2017 2043  APTT 43*  --   --  26  INR 2.91 2.44 1.46 1.15    Sepsis Markers Recent Labs  Lab 01/26/2017 [phone]  01/05/2017 0237  01/31/2017 1837 01/20/17 0421 01/20/17 1024 01/21/17 0524  LATICACIDVEN  --   --    < > 2.1*  --  2.5* 1.2  PROCALCITON 0.32 0.45  --   --  0.39  --   --    < > = values in this interval not displayed.    ABG Recent Labs  Lab 01/28/2017 1546 01/15/2017 2033 01/20/17 0302  PHART PENDING 7.12* 7.31*  PCO2ART 63* 62* 37  PO2ART 65* 58* 66*    Liver Enzymes Recent Labs  Lab 01/08/2017 0452 01/09/2017 2043 01/21/17 0524 01/22/17 0430  AST 35  --  16 18  ALT 14*  --  13* 14*  ALKPHOS 179*  --  98 111  BILITOT 0.8  --  0.8 0.8  ALBUMIN  2.6* 2.7* 2.3* 2.3*    Cardiac Enzymes Recent Labs  Lab 01/09/2017 2158 01/25/2017 0237 01/08/2017 0759  TROPONINI <0.03 <0.03 <0.03    Glucose Recent Labs  Lab 01/06/2017 0757 01/21/17 0744 01/21/17 1655 01/22/17 0020 01/22/17 0750  GLUCAP 116* 201* 174* 162* 157*    CXR: Cardiomegaly, bilateral pleural effusions  KUB: Nonspecific bowel gas pattern   ASSESSMENT / PLAN:  PULMONARY A: Acute hypoxemic respiratory failure Initially intubated for EGD Pulmonary edema/pleural effusions - likely due to volume resuscitation P:   Extubated this morning under my supervision Monitor in ICU post extubation Supplemental O2 to maintain SPO2 >90% Furosemide 40 mg x1 11/20   CARDIOVASCULAR A:  Chronic AF, rate adequately controlled Hemorrhagic shock -resolved Doubt septic shock P:  Cardiac and hemodynamic monitoring per ICU protocol  RENAL A:   No acute issues P:   Monitor BMET intermittently Monitor I/Os Correct electrolytes as indicated   GASTROINTESTINAL A:   Duodenal ulcer - S/P EGD, S/P embolization Ileus pattern on KUB -improved P:   SUP: IV PPI NPO postextubation  HEM/ONC A:   Acute blood loss anemia Thrombocytopenia -improving Metastatic prostate cancer P:  DVT px: SCDs Monitor CBC intermittently Transfuse per usual guidelines   INFECTIOUS A:   No definite infection identified P:   Monitor temp, WBC count Micro and abx as above   ENDOCRINE A:   Stress induced hyperglycemia, mild P:   Continue CBG q 8 hrs Consider SSI for glu > 180  NEUROLOGIC A:   Acute encephalopathy/hypoactive delirium P:   RASS goal: 0 Avoid all sedatives Advance activity as able   FAMILY: Multiple family members updated @ bedside   CCM time: 45 mins  The above time includes time spent in consultation with patient and/or family members and reviewing care plan on multidisciplinary rounds.  This also includes several rechecks after extubation  Merton Border,  MD PCCM service Mobile [phone] Pager [phone] 01/22/2017, 2:51 PM

## 2017-01-22 NOTE — Progress Notes (Addendum)
Chad Antigua, MD [address], [place], [place], [place], [address] [address] [place], [place], [place], [address] Phone: [phone]  Fax: [phone]   Subjective:  Patient still intubated, family at bedside, NG tube noted to have bloody output this morning  Objective: Vital signs in last 24 hours: Vitals:   01/22/17 1300 01/22/17 1400 01/22/17 1500 01/22/17 1600  BP: (!) 145/81 (!) 150/78 125/73   Pulse: (!) 124 (!) 119 (!) 107   Resp: 12 (!) 8 10   Temp: 97.9 F (36.6 C)   98.7 F (37.1 C)  TempSrc: Axillary   Axillary  SpO2: 98% 97% 91%   Weight:      Height:       Weight change:   Intake/Output Summary (Last 24 hours) at 01/22/2017 1702 Last data filed at 01/22/2017 1603 Gross per 24 hour  Intake 1757.96 ml  Output 1295 ml  Net 462.96 ml     Exam: Cardiac: +S1, +S2, RRR, No edema Pulm: Intubated, CTA b/l, Normal Resp Effort Abd: Soft, NT/ND, No HSM Skin: Warm, no rashes Neck: Supple, Trachea midline   Lab Results: @LABTEST2 @ Micro Results: Recent Results (from the past 240 hour(s))  Blood Culture (routine x 2)     Status: None (Preliminary result)   Collection Time: 01/10/2017  4:52 AM  Result Value Ref Range Status   Specimen Description BLOOD BLOOD RIGHT WRIST  Final   Special Requests   Final    BOTTLES DRAWN AEROBIC AND ANAEROBIC Blood Culture adequate volume   Culture NO GROWTH 4 DAYS  Final   Report Status PENDING  Incomplete  Blood Culture (routine x 2)     Status: None (Preliminary result)   Collection Time: 01/17/2017  4:52 AM  Result Value Ref Range Status   Specimen Description BLOOD BLOOD RIGHT FOREARM  Final   Special Requests   Final    BOTTLES DRAWN AEROBIC AND ANAEROBIC Blood Culture adequate volume   Culture NO GROWTH 4 DAYS  Final   Report Status PENDING  Incomplete  MRSA PCR Screening     Status: None   Collection Time: 01/09/2017  8:29 AM  Result Value Ref Range Status   MRSA by PCR NEGATIVE NEGATIVE Final   Comment:        The GeneXpert MRSA Assay (FDA approved for NASAL specimens only), is one component of a comprehensive MRSA colonization surveillance program. It is not intended to diagnose MRSA infection nor to guide or monitor treatment for MRSA infections.    Studies/Results: Dg Abd 1 View  Result Date: 01/22/2017 CLINICAL DATA:  Ileus. EXAM: ABDOMEN - 1 VIEW COMPARISON:  Two days ago FINDINGS: Gaseous distention of colon and small bowel that is less extensive than prior. The stomach has been decompressed. Known sclerotic bony metastases. No concerning mass effect or gas collection. Hemo clip and embolization coils. IMPRESSION: History of ileus with less gaseous distention than 2 days prior. No indication of bowel obstruction. Electronically Signed   By: Monte Fantasia M.D.   On: 01/22/2017 08:02   Dg Chest Port 1 View  Result Date: 01/22/2017 CLINICAL DATA:  Respiratory failure and ileus. EXAM: PORTABLE CHEST 1 VIEW COMPARISON:  Three days ago FINDINGS: Haziness of the bilateral lower chest compatible with layering pleural effusions, increased. Cardiomegaly and vascular pedicle widening. Endotracheal tube tip just below the clavicular heads. An orogastric tube reaches the stomach, which has been decompressed. Left IJ central line with tip near the SVC origin. Sclerotic bones this patient  with history of known prostate cancer with metastases. IMPRESSION: Layering pleural effusions/atelectasis, potentially moderate, increased from 3 days ago. Unremarkable tube and line placement. Electronically Signed   By: Monte Fantasia M.D.   On: 01/22/2017 08:00   Ir Hybrid Trauma Embolization  Result Date: 01/22/2017 CLINICAL DATA:  Hemodynamically significant duodenal bleed, incompletely controlled with endoscopic approach. EXAM: TWO-VESSEL MESENTERIC ARTERIOGRAM COIL EMBOLIZATION OF THE GASTRODUODENAL ARTERY ANESTHESIA/SEDATION: Intravenous Fentanyl and Versed were administered as conscious  sedation during continuous monitoring of the patient's level of consciousness and physiological / cardiorespiratory status by the radiology RN, with a total moderate sedation time of 60 minutes. MEDICATIONS: Lidocaine 1% subcutaneous CONTRAST:  79m ISOVUE-300 IOPAMIDOL (ISOVUE-300) INJECTION 61% PROCEDURE: The procedure, risks (including but not limited to bleeding, infection, organ damage ), benefits, and alternatives were explained to the family. The patient had already been intubated. Questions regarding the procedure were encouraged and answered. The family understands and consents to the procedure. Right femoral region prepped and draped in usual sterile fashion. Maximal barrier sterile technique was utilized including caps, mask, sterile gowns, sterile gloves, sterile drape, hand hygiene and skin antiseptic. The right common femoral artery was localized under ultrasound. Under real-time ultrasound guidance, the vessel was accessed with a 21-gauge micropuncture needle, exchanged over a 018 guidewire for a transitional dilator, through which a 035 guidewire was advanced. Over this, a 5 FP[place] sheath was placed, through which a 5 FP[place]istanC2 catheter was advanced and used to selectively catheterize the celiac axis for selective arteriography. With the aid of an angled Glidewire, the CT was advanced into the common hepatic artery for additional selective arteriography. The gastroduodenal artery was ocal localize the, directly supplying the region of visible endoscopic clips. No active hemorrhage was confirmed. However, A coaxial microcatheter was advanced with the aid of a 018 wire into the gastroduodenal artery. The GDA was embolized with 4 in 5 mm coils Ruby coils. Confirmatory arteriogram from the common hepatic artery catheter showed cessation of flow into the GDA and its branches. Proper hepatic artery branches remain patent. The catheter was then manipulated into the proximal superior mesenteric  artery for selective arteriography. No significant retrograde filling of the GDA was identified. No evidence of hemorrhage or other vascular lesion. The catheter was then removed. After confirmatory femoral arteriogram through the sheath, the sheath was removed and hemostasis achieved with the aid of the StarClose device. The patient tolerated the procedure well. COMPLICATIONS: None immediate FINDINGS: The gastroduodenal artery branches directly supplied the region of the duodenum marked with the endoscopic clips. Technically successful coil embolization of the gastroduodenal artery and branches. No significant retrograde filling from superior mesenteric artery branches. IMPRESSION: 1. Technically successful coil embolization of gastroduodenal artery supply to the proximal duodenum bleeding region marked by endoscopic clips. Electronically Signed   By: DLucrezia EuropeM.D.   On: 01/22/2017 08:14   Medications:  Scheduled Meds: . chlorhexidine gluconate (MEDLINE KIT)  15 mL Mouth Rinse BID  . collagenase  1 application Topical BID  . [START ON 01/23/2017] digoxin  0.25 mg Oral Daily  . ferrous sulfate  325 mg Oral Daily  . ipratropium-albuterol  3 mL Nebulization Q6H  . mouth rinse  15 mL Mouth Rinse 10 times per day  . [START ON 01/23/2017] metoprolol tartrate  25 mg Oral BID  . mirtazapine  15 mg Oral QHS  . pantoprazole (PROTONIX) IV  40 mg Intravenous Q12H  . polyethylene glycol  17 g Per Tube BID   Continuous  Infusions: . amiodarone 30 mg/hr (01/22/17 0918)  . dexmedetomidine (PRECEDEX) IV infusion 0.4 mcg/kg/hr (01/22/17 1549)   PRN Meds:.acetaminophen **OR** acetaminophen, metoprolol tartrate, [DISCONTINUED] ondansetron **OR** ondansetron (ZOFRAN) IV   Assessment: Active Problems:   Sepsis (HCC) GI bleed, duodenal ulcer   Plan: No active GI bleeding since embolization Hemoglobin has been stable ICU attending called me about NG tube output of blood this morning.  I suggested that this  is likely due to NG tube, trauma, and to recheck hemoglobin.  Repeat hemoglobin has not dropped.  No further episodes of active GI bleeding, ICU would like to extubate patient, this is reasonable if no other contraindication present  Continue Protonix 6m IV BID Avoid NSAIDs Serial cbcs and transfuse PRN   LOS: 4 days   VVonda Antigua MD 01/22/2017, 5:02 PM

## 2017-01-23 ENCOUNTER — Inpatient Hospital Stay: Payer: Medicare Other

## 2017-01-23 DIAGNOSIS — Z515 Encounter for palliative care: Secondary | ICD-10-CM

## 2017-01-23 DIAGNOSIS — Z66 Do not resuscitate: Secondary | ICD-10-CM

## 2017-01-23 DIAGNOSIS — G9341 Metabolic encephalopathy: Secondary | ICD-10-CM

## 2017-01-23 DIAGNOSIS — J9621 Acute and chronic respiratory failure with hypoxia: Secondary | ICD-10-CM

## 2017-01-23 DIAGNOSIS — C7951 Secondary malignant neoplasm of bone: Secondary | ICD-10-CM

## 2017-01-23 DIAGNOSIS — R58 Hemorrhage, not elsewhere classified: Secondary | ICD-10-CM

## 2017-01-23 DIAGNOSIS — I482 Chronic atrial fibrillation: Secondary | ICD-10-CM

## 2017-01-23 DIAGNOSIS — C61 Malignant neoplasm of prostate: Secondary | ICD-10-CM

## 2017-01-23 LAB — CBC
HEMATOCRIT: 33.3 % — AB (ref 40.0–52.0)
Hemoglobin: 10.9 g/dL — ABNORMAL LOW (ref 13.0–18.0)
MCH: 30.7 pg (ref 26.0–34.0)
MCHC: 32.9 g/dL (ref 32.0–36.0)
MCV: 93.3 fL (ref 80.0–100.0)
Platelets: 65 10*3/uL — ABNORMAL LOW (ref 150–440)
RBC: 3.56 MIL/uL — AB (ref 4.40–5.90)
RDW: 16 % — ABNORMAL HIGH (ref 11.5–14.5)
WBC: 8.1 10*3/uL (ref 3.8–10.6)

## 2017-01-23 LAB — GLUCOSE, CAPILLARY
Glucose-Capillary: 133 mg/dL — ABNORMAL HIGH (ref 65–99)
Glucose-Capillary: 136 mg/dL — ABNORMAL HIGH (ref 65–99)
Glucose-Capillary: 155 mg/dL — ABNORMAL HIGH (ref 65–99)

## 2017-01-23 LAB — BASIC METABOLIC PANEL
Anion gap: 6 (ref 5–15)
BUN: 39 mg/dL — ABNORMAL HIGH (ref 6–20)
CHLORIDE: 111 mmol/L (ref 101–111)
CO2: 26 mmol/L (ref 22–32)
Calcium: 6 mg/dL — CL (ref 8.9–10.3)
Creatinine, Ser: 1.04 mg/dL (ref 0.61–1.24)
Glucose, Bld: 147 mg/dL — ABNORMAL HIGH (ref 65–99)
POTASSIUM: 4.5 mmol/L (ref 3.5–5.1)
SODIUM: 143 mmol/L (ref 135–145)

## 2017-01-23 LAB — CULTURE, BLOOD (ROUTINE X 2)
Culture: NO GROWTH
Culture: NO GROWTH
Special Requests: ADEQUATE
Special Requests: ADEQUATE

## 2017-01-23 MED ORDER — MORPHINE SULFATE (PF) 2 MG/ML IV SOLN
1.0000 mg | Freq: Once | INTRAVENOUS | Status: AC
  Administered 2017-01-23: 1 mg via INTRAVENOUS
  Filled 2017-01-23: qty 1

## 2017-01-23 MED ORDER — DEXTROSE 5 % IV SOLN
INTRAVENOUS | Status: DC
  Administered 2017-01-23: 10:00:00 via INTRAVENOUS
  Administered 2017-01-24: 40 mL via INTRAVENOUS
  Administered 2017-01-25: 20:00:00 via INTRAVENOUS

## 2017-01-23 MED ORDER — ORAL CARE MOUTH RINSE
15.0000 mL | OROMUCOSAL | Status: DC | PRN
  Administered 2017-01-26: 15 mL via OROMUCOSAL
  Filled 2017-01-23: qty 15

## 2017-01-23 MED ORDER — MORPHINE SULFATE (PF) 2 MG/ML IV SOLN
1.0000 mg | INTRAVENOUS | Status: DC | PRN
  Administered 2017-01-23 (×2): 1 mg via INTRAVENOUS
  Administered 2017-01-24 – 2017-01-26 (×6): 2 mg via INTRAVENOUS
  Filled 2017-01-23 (×8): qty 1

## 2017-01-23 MED ORDER — DIGOXIN 0.25 MG/ML IJ SOLN
0.1250 mg | Freq: Every day | INTRAMUSCULAR | Status: DC
Start: 2017-01-23 — End: 2017-01-26
  Administered 2017-01-23 – 2017-01-26 (×4): 0.125 mg via INTRAVENOUS
  Filled 2017-01-23 (×4): qty 0.5

## 2017-01-23 MED ORDER — SODIUM CHLORIDE 0.9 % IV SOLN
1.0000 g | Freq: Once | INTRAVENOUS | Status: AC
  Administered 2017-01-23: 1 g via INTRAVENOUS
  Filled 2017-01-23: qty 10

## 2017-01-23 MED ORDER — IPRATROPIUM-ALBUTEROL 0.5-2.5 (3) MG/3ML IN SOLN
3.0000 mL | Freq: Four times a day (QID) | RESPIRATORY_TRACT | Status: DC | PRN
  Administered 2017-01-25: 3 mL via RESPIRATORY_TRACT
  Filled 2017-01-23: qty 3

## 2017-01-23 MED ORDER — HALOPERIDOL LACTATE 5 MG/ML IJ SOLN
1.0000 mg | Freq: Four times a day (QID) | INTRAMUSCULAR | Status: DC | PRN
Start: 2017-01-23 — End: 2017-01-27
  Administered 2017-01-23 – 2017-01-25 (×4): 1 mg via INTRAVENOUS
  Filled 2017-01-23 (×4): qty 1

## 2017-01-23 NOTE — Progress Notes (Signed)
[facility] Cardiology  SUBJECTIVE: Patient laying in bed, sedated   Vitals:   01/23/17 0900 01/23/17 1000 01/23/17 1100 01/23/17 1200  BP: 112/70 124/90 115/65 (!) 114/56  Pulse: 74 86 99 91  Resp: 11 18  20   Temp:   97.8 F (36.6 C)   TempSrc:   Axillary   SpO2: 99% 96% 99% 96%  Weight:      Height:         Intake/Output Summary (Last 24 hours) at 01/23/2017 1307 Last data filed at 01/23/2017 1200 Gross per 24 hour  Intake 1327.89 ml  Output 2435 ml  Net -1107.11 ml      PHYSICAL EXAM  General: Laying in bed, sleeping, sedated, HEENT:  Normocephalic and atramatic Neck:  No JVD.  Lungs: Clear bilaterally to auscultation and percussion. Heart: HRRR . Normal S1 and S2 without gallops or murmurs.  Abdomen: Bowel sounds are positive, abdomen soft and non-tender  Msk:  Back normal, normal gait. Normal strength and tone for age. Extremities: No clubbing, cyanosis or edema.   Neuro: Sedated Psych:  Good affect, responds appropriately   LABS: Basic Metabolic Panel: Recent Labs    01/21/17 0524 01/22/17 0430 01/23/17 0451  NA 139 139 143  K 3.5 5.0 4.5  CL 112* 113* 111  CO2 19* 23 26  GLUCOSE 180* 165* 147*  BUN 26* 34* 39*  CREATININE 0.84 1.16 1.04  CALCIUM 5.6* 6.2* 6.0*  MG 2.1 2.4  --   PHOS 1.7* 2.0*  --    Liver Function Tests: Recent Labs    01/21/17 0524 01/22/17 0430  AST 16 18  ALT 13* 14*  ALKPHOS 98 111  BILITOT 0.8 0.8  PROT 4.0* 4.5*  ALBUMIN 2.3* 2.3*   No results for input(s): LIPASE, AMYLASE in the last 72 hours. CBC: Recent Labs    01/22/17 1245 01/23/17 0451  WBC 15.5* 8.1  HGB 12.5* 10.9*  HCT 38.4* 33.3*  MCV 94.0 93.3  PLT 93* 65*   Cardiac Enzymes: No results for input(s): CKTOTAL, CKMB, CKMBINDEX, TROPONINI in the last 72 hours. BNP: Invalid input(s): POCBNP D-Dimer: No results for input(s): DDIMER in the last 72 hours. Hemoglobin A1C: No results for input(s): HGBA1C in the last 72 hours. Fasting Lipid  Panel: Recent Labs    01/21/17 0951  TRIG 146   Thyroid Function Tests: No results for input(s): TSH, T4TOTAL, T3FREE, THYROIDAB in the last 72 hours.  Invalid input(s): FREET3 Anemia Panel: No results for input(s): VITAMINB12, FOLATE, FERRITIN, TIBC, IRON, RETICCTPCT in the last 72 hours.  Dg Abd 1 View  Result Date: 01/22/2017 CLINICAL DATA:  Ileus. EXAM: ABDOMEN - 1 VIEW COMPARISON:  Two days ago FINDINGS: Gaseous distention of colon and small bowel that is less extensive than prior. The stomach has been decompressed. Known sclerotic bony metastases. No concerning mass effect or gas collection. Hemo clip and embolization coils. IMPRESSION: History of ileus with less gaseous distention than 2 days prior. No indication of bowel obstruction. Electronically Signed   By: Monte Fantasia M.D.   On: 01/22/2017 08:02   Dg Chest Port 1 View  Result Date: 01/23/2017 CLINICAL DATA:  Respiratory failure, atrial fibrillation, COPD, prostate malignancy, former smoker. EXAM: PORTABLE CHEST 1 VIEW COMPARISON:  Portable chest x-ray of January 22, 2017 FINDINGS: The lungs are reasonably well inflated. Persistent increased density is noted at the bases. The cardiac silhouette is enlarged and the pulmonary vascularity engorged. There is calcification in the wall of the aortic arch. The  trachea and esophagus have been extubated. The left internal jugular venous catheter tip projects over the proximal SVC. IMPRESSION: Stable bilateral pleural effusions, cardiomegaly, and pulmonary vascular congestion consistent with CHF. Thoracic aortic atherosclerosis. Electronically Signed   By: David  Martinique M.D.   On: 01/23/2017 09:29   Dg Chest Port 1 View  Result Date: 01/22/2017 CLINICAL DATA:  Respiratory failure and ileus. EXAM: PORTABLE CHEST 1 VIEW COMPARISON:  Three days ago FINDINGS: Haziness of the bilateral lower chest compatible with layering pleural effusions, increased. Cardiomegaly and vascular pedicle  widening. Endotracheal tube tip just below the clavicular heads. An orogastric tube reaches the stomach, which has been decompressed. Left IJ central line with tip near the SVC origin. Sclerotic bones this patient with history of known prostate cancer with metastases. IMPRESSION: Layering pleural effusions/atelectasis, potentially moderate, increased from 3 days ago. Unremarkable tube and line placement. Electronically Signed   By: Monte Fantasia M.D.   On: 01/22/2017 08:00     Echo LVEF 55-60% by echocardiogram 05/22/2015  TELEMETRY: Atrial fibrillation with controlled rate:  ASSESSMENT AND PLAN:  Active Problems:   Sepsis ([place])    1.  Atrial fibrillation, rate controlled on amiodarone drip 2.  GI bleed, secondary to duodenal ulcer, status post coagulation and embolization 3.  Delirium, agitation, status post extubation  Recommendations  1.  Continue to hold warfarin 2.  Resume metoprolol succinate 25 mg daily when patient able to take p.o. Safely 3.  Continue amiodarone drip for now, taper to off once patient has restarted metoprolol succinate  Sign off for now, please call if any questions   Isaias Cowman, MD, PhD, [facility] 01/23/2017 1:07 PM

## 2017-01-23 NOTE — Progress Notes (Signed)
[name], MD [address] [place], [place], [place], [address] [address], [place], [place], [place], [address] Phone: [phone]  Fax: [phone]   Subjective: Pt. Extubated yesterday and is agitated somewhat. Family at bedside. No PRBC transfusion in 24 hrs  Objective: Vital signs in last 24 hours: Vitals:   01/23/17 1400 01/23/17 1500 01/23/17 1600 01/23/17 1700  BP: 122/75 122/64 103/61 111/69  Pulse: 89 89 71 97  Resp: (!) 23 (!) 27 12 (!) 23  Temp:  (!) 97.4 F (36.3 C)    TempSrc:  Axillary    SpO2: 98% 96% 99% 98%  Weight:      Height:       Weight change: -5.7 kg (-9.1 oz)  Intake/Output Summary (Last 24 hours) at 01/23/2017 1810 Last data filed at 01/23/2017 1700 Gross per 24 hour  Intake 1413 ml  Output 1800 ml  Net -387 ml     Exam: Cardiac: +S1, +S2, RRR, No edema Pulm: CTA b/l, Normal Resp Effort Abd: Soft, NT/ND, No HSM Skin: Warm, no rashes Neck: Supple, Trachea midline   Lab Results: @LABTEST2 @ Micro Results: Recent Results (from the past 240 hour(s))  Blood Culture (routine x 2)     Status: None   Collection Time: 01/20/2017  4:52 AM  Result Value Ref Range Status   Specimen Description BLOOD BLOOD RIGHT WRIST  Final   Special Requests   Final    BOTTLES DRAWN AEROBIC AND ANAEROBIC Blood Culture adequate volume   Culture NO GROWTH 5 DAYS  Final   Report Status 01/23/2017 FINAL  Final  Blood Culture (routine x 2)     Status: None   Collection Time: 01/04/2017  4:52 AM  Result Value Ref Range Status   Specimen Description BLOOD BLOOD RIGHT FOREARM  Final   Special Requests   Final    BOTTLES DRAWN AEROBIC AND ANAEROBIC Blood Culture adequate volume   Culture NO GROWTH 5 DAYS  Final   Report Status 01/23/2017 FINAL  Final  MRSA PCR Screening     Status: None   Collection Time: 01/07/2017  8:29 AM  Result Value Ref Range Status   MRSA by PCR NEGATIVE NEGATIVE Final    Comment:        The GeneXpert MRSA Assay  (FDA approved for NASAL specimens only), is one component of a comprehensive MRSA colonization surveillance program. It is not intended to diagnose MRSA infection nor to guide or monitor treatment for MRSA infections.    Studies/Results: Dg Abd 1 View  Result Date: 01/22/2017 CLINICAL DATA:  Ileus. EXAM: ABDOMEN - 1 VIEW COMPARISON:  Two days ago FINDINGS: Gaseous distention of colon and small bowel that is less extensive than prior. The stomach has been decompressed. Known sclerotic bony metastases. No concerning mass effect or gas collection. Hemo clip and embolization coils. IMPRESSION: History of ileus with less gaseous distention than 2 days prior. No indication of bowel obstruction. Electronically Signed   By: Monte Fantasia M.D.   On: 01/22/2017 08:02   Dg Chest Port 1 View  Result Date: 01/23/2017 CLINICAL DATA:  Respiratory failure, atrial fibrillation, COPD, prostate malignancy, former smoker. EXAM: PORTABLE CHEST 1 VIEW COMPARISON:  Portable chest x-ray of January 22, 2017 FINDINGS: The lungs are reasonably well inflated. Persistent increased density is noted at the bases. The cardiac silhouette is enlarged and the pulmonary vascularity engorged. There is calcification in the wall of the aortic arch. The trachea and esophagus have been extubated. The left  internal jugular venous catheter tip projects over the proximal SVC. IMPRESSION: Stable bilateral pleural effusions, cardiomegaly, and pulmonary vascular congestion consistent with CHF. Thoracic aortic atherosclerosis. Electronically Signed   By: David  Martinique M.D.   On: 01/23/2017 09:29   Dg Chest Port 1 View  Result Date: 01/22/2017 CLINICAL DATA:  Respiratory failure and ileus. EXAM: PORTABLE CHEST 1 VIEW COMPARISON:  Three days ago FINDINGS: Haziness of the bilateral lower chest compatible with layering pleural effusions, increased. Cardiomegaly and vascular pedicle widening. Endotracheal tube tip just below the clavicular  heads. An orogastric tube reaches the stomach, which has been decompressed. Left IJ central line with tip near the SVC origin. Sclerotic bones this patient with history of known prostate cancer with metastases. IMPRESSION: Layering pleural effusions/atelectasis, potentially moderate, increased from 3 days ago. Unremarkable tube and line placement. Electronically Signed   By: Monte Fantasia M.D.   On: 01/22/2017 08:00   Medications:  Scheduled Meds: . collagenase  1 application Topical BID  . digoxin  0.125 mg Intravenous Daily  . pantoprazole (PROTONIX) IV  40 mg Intravenous Q12H   Continuous Infusions: . amiodarone 30 mg/hr (01/23/17 0933)  . dexmedetomidine (PRECEDEX) IV infusion 2 mcg/kg/hr (01/23/17 1755)  . dextrose 40 mL/hr at 01/23/17 0949   PRN Meds:.acetaminophen **OR** acetaminophen, haloperidol lactate, ipratropium-albuterol, mouth rinse, metoprolol tartrate, morphine injection, [DISCONTINUED] ondansetron **OR** ondansetron (ZOFRAN) IV   Assessment: Active Problems:   Sepsis ([place])   Encephalopathy, metabolic   Acute on chronic respiratory failure with hypoxia (HCC)   Prostate cancer metastatic to bone [facility])   Palliative care by specialist   DNR (do not resuscitate)    Plan: Pt. Improving overall and no active GI bleeding at this time since embolization Protonix 40mg  BID should be continued for 8-12 weeks due to duodenal ulcer At discharge pt. Should follow up in GI clinic Follow cardiology recommendations in regard to anticoagulation Avoid NSAIDs Continue Serial CBCs and transfuse PRN   LOS: 5 days   [name], MD 01/23/2017, 6:10 PM

## 2017-01-23 NOTE — Care Management (Signed)
RNCM will follow palliative team recommendations as decided with family today.  I have left hospice agency list with patient's physical chart for family to review when appropriate.  Per progression rounds plan anticipated is home with hospice at some point.

## 2017-01-23 NOTE — Consult Note (Signed)
Consultation Note Date: 01/23/2017   Patient Name: Chad Kirby  DOB: [birth year]  MRN: [id number]  Age / Sex: 83 y.o., male  [facility]P: [facility] Referring Physician: Max Sane, MD  Reason for Consultation: Establishing goals of care and Psychosocial/spiritual support  HPI/Patient Profile: 83 y.o. male   admitted on 02/01/2017 with  past medical history of prostate cancer with metastasis to the bone (treated at [facility]), COPD and atrial fibrillation presents to the emergency department via EMS after an episode of shortness of breath.    The patient was discharged late yesterday evening from [facility] following  chemotherapy for his prostate cancer.  He returned home but progressively became more short of breath.  He woke his wife at night due to his coughing.  They called EMS who found him to have had a grossly bloody stool in bed.    Elective EGD on 11/17, acute respiratory failure, intubation.  Extubation 11/20 with decision for no further intubation.  Continues on amiodarone gtt for a-fib.   Patient is  delirium, family tells me he has been severely agitated since yesterday.  Family face treatment option decisions, advanced directive decisions and anticipatory care needs.     Clinical Assessment and Goals of Care:   This NP Wadie Lessen reviewed medical records, received report from team, assessed the patient and then meet at the patient's bedside along with his wife and two daughters  to discuss diagnosis, prognosis, GOC, EOL wishes disposition and options.  A  discussion was had today regarding current medical situation, within the context of mortality and patient's multiple co-morbidites.  Long term poor prognosis.  The difference between a aggressive medical intervention path  and a palliative comfort care path for this patient at this time was had.  Values and goals of  care important to patient and family were attempted to be elicited.  Family verbalizes that comfort is the priority but at this time they wish to continue with current treatment plan and remain hopeful for improvement.  If he doesn't rebound over the next 24-48 hours they understand they will be faced with decision to shift to a more comfort path.   Hospice benefit was discussed.    Natural trajectory and expectations at EOL were discussed.  Questions and concerns addressed.   Family encouraged to call with questions or concerns.  PMT will continue to support holistically.  This nurse practitioner follow-up on Friday morning    SUMMARY OF RECOMMENDATIONS    Code Status/Advance Care Planning:  DNR   Symptom Management:   Pain/Dyspnea:  Morphine 1 mg IV every 3 hrs prn   Palliative Prophylaxis:   Aspiration, Bowel Regimen, Delirium Protocol, Frequent Pain Assessment and Oral Care  Additional Recommendations (Limitations, Scope, Preferences):  Full Scope Treatment  Psycho-social/Spiritual:   Desire for further Chaplaincy support:no-declined at this time  Additional Recommendations: Education on Hospice  Prognosis:   < 2 weeks-prognosis depends on desire for life prolonging measures  Discharge Planning: To Be Determined  Primary Diagnoses: Present on Admission: . Sepsis ([place])   I have reviewed the medical record, interviewed the patient and family, and examined the patient. The following aspects are pertinent.  Past Medical History:  Diagnosis Date  . Atrial fibrillation ([place])   . Cancer [facility])    Prostate with bone mets-current chemo treatment  . COPD (chronic obstructive pulmonary disease) ([place])   . Gastric ulcer   . Hyperlipidemia    Social History   Socioeconomic History  . Marital status: Married    Spouse name: None  . Number of children: None  . Years of education: None  . Highest education level: None  Social Needs  . Financial resource  strain: None  . Food insecurity - worry: None  . Food insecurity - inability: None  . Transportation needs - medical: None  . Transportation needs - non-medical: None  Occupational History  . None  Tobacco Use  . Smoking status: Former Research scientist (life sciences)  . Smokeless tobacco: Never Used  Substance and Sexual Activity  . Alcohol use: Yes    Alcohol/week: 0.0 oz    Comment: occasional  . Drug use: None  . Sexual activity: None  Other Topics Concern  . None  Social History Narrative  . None   Family History  Problem Relation Age of Onset  . Brain cancer Sister   . Stroke Father    Scheduled Meds: . collagenase  1 application Topical BID  . digoxin  0.125 mg Intravenous Daily  . ipratropium-albuterol  3 mL Nebulization Q6H  . pantoprazole (PROTONIX) IV  40 mg Intravenous Q12H   Continuous Infusions: . amiodarone 30 mg/hr (01/23/17 0933)  . dexmedetomidine (PRECEDEX) IV infusion 0.8 mcg/kg/hr (01/23/17 1235)  . dextrose 40 mL/hr at 01/23/17 0949   PRN Meds:.acetaminophen **OR** acetaminophen, mouth rinse, metoprolol tartrate, [DISCONTINUED] ondansetron **OR** ondansetron (ZOFRAN) IV Medications Prior to Admission:  Prior to Admission medications   Medication Sig Start Date End Date Taking? Authorizing Provider  atorvastatin (LIPITOR) 80 MG tablet Take 80 mg by mouth at bedtime.   Yes [provider]  budesonide-formoterol (SYMBICORT) 80-4.5 MCG/ACT inhaler Inhale 2 puffs into the lungs 2 (two) times daily.   Yes [provider]  docusate sodium (COLACE) 100 MG capsule Take 1 capsule (100 mg total) 2 (two) times daily as needed by mouth for mild constipation. 01/07/17  Yes Gouru, Illene Silver, MD  ferrous sulfate 325 (65 FE) MG EC tablet Take 1 tablet (325 mg total) 2 (two) times daily by mouth. 01/07/17 01/07/18 Yes Gouru, Illene Silver, MD  metoprolol succinate (TOPROL-XL) 25 MG 24 hr tablet Take 25 mg by mouth daily.   Yes [provider]  pantoprazole (PROTONIX) 40 MG tablet  Take 1 tablet (40 mg total) 2 (two) times daily by mouth. 01/07/17  Yes Gouru, Illene Silver, MD  prochlorperazine (COMPAZINE) 10 MG tablet Take 10 mg by mouth every 6 (six) hours as needed. For nausea and vomiting. 04/14/15  Yes [provider]  senna-docusate (SENOKOT-S) 8.6-50 MG tablet Take 1-2 tablets 2 (two) times daily as needed by mouth for mild constipation.   Yes [provider]  tiotropium (SPIRIVA) 18 MCG inhalation capsule Place 18 mcg into inhaler and inhale daily.   Yes [provider]  warfarin (COUMADIN) 3 MG tablet Take 1 tablet (3 mg total) daily by mouth. 01/07/17  Yes Gouru, Illene Silver, MD  digoxin (LANOXIN) 0.25 MG tablet Take 1 tablet (0.25 mg total) by mouth daily. Patient not taking: Reported on 01/21/2017 06/10/15  Max Sane, MD  feeding supplement, ENSURE ENLIVE, (ENSURE ENLIVE) LIQD Take 237 mLs by mouth 3 (three) times daily with meals. 06/10/15   Max Sane, MD  gabapentin (NEURONTIN) 300 MG capsule Take 1 capsule (300 mg total) by mouth 3 (three) times daily. Patient not taking: Reported on 01/04/2017 06/10/15   Max Sane, MD  polyethylene glycol (MIRALAX / GLYCOLAX) packet Take 17 g by mouth 2 (two) times daily. Patient not taking: Reported on 01/04/2017 05/06/15   [name], MD   Allergies  Allergen Reactions  . Fentanyl     Myoclonus  . Latex Rash   Review of Systems  Unable to perform ROS: Acuity of condition    Physical Exam  Constitutional: He appears lethargic. He appears ill. Nasal cannula in place.  - agitated and hollering out freqently  Cardiovascular: Tachycardia present.  Pulmonary/Chest: Tachypnea noted. He has decreased breath sounds.  Neurological: He appears lethargic.  Skin: Skin is warm and dry.    Vital Signs: BP (!) 114/56   Pulse 91   Temp 97.8 F (36.6 C) (Axillary)   Resp 20   Ht 6\' 1"  (1.854 m)   Wt 112.6 kg (248 lb 3.8 oz)   SpO2 96%   BMI 32.75 kg/m  Pain Assessment: CPOT POSS *See Group Information*:  1-Acceptable,Awake and alert Pain Score: 0-No pain   SpO2: SpO2: 96 % O2 Device:SpO2: 96 % O2 Flow Rate: .O2 Flow Rate (L/min): 2 L/min  IO: Intake/output summary:   Intake/Output Summary (Last 24 hours) at 01/23/2017 1330 Last data filed at 01/23/2017 1200 Gross per 24 hour  Intake 1207.89 ml  Output 2200 ml  Net -992.11 ml    LBM: Last BM Date: 01/04/17 Baseline Weight: Weight: 109.6 kg (241 lb 10 oz) Most recent weight: Weight: 112.6 kg (248 lb 3.8 oz)     Palliative Assessment/Data: 20 %   Discussed with Dr Leonidas Romberg   Time In: 1200 Time Out: 1315 Time Total: 75 min Greater than 50%  of this time was spent counseling and coordinating care related to the above assessment and plan.  Signed by: Wadie Lessen, NP   Please contact Palliative Medicine Team phone at [phone] for questions and concerns.  For individual provider: See Shea Evans

## 2017-01-23 NOTE — Progress Notes (Signed)
[place] [facility] [place] NAME: Chad Kirby    MR#:  [id number]  DATE OF BIRTH:  1929/04/05  SUBJECTIVE:  CHIEF COMPLAINT:   Chief Complaint  Patient presents with  . Shortness of Breath  . Rectal Bleeding  extubate y'day and remains on N.C. Was agitated requiring precedex, all family at bedside REVIEW OF SYSTEMS:  Review of Systems  Unable to perform ROS: Critical illness   DRUG ALLERGIES:   Allergies  Allergen Reactions  . Fentanyl     Myoclonus  . Latex Rash   VITALS:  Blood pressure 122/75, pulse 89, temperature 97.8 F (36.6 C), temperature source Axillary, resp. rate (!) 23, height 6\' 1"  (1.854 m), weight 112.6 kg (248 lb 3.8 oz), SpO2 98 %. PHYSICAL EXAMINATION:  Physical Exam  Constitutional: He is well-developed, well-nourished, and in no distress.  HENT:  Head: Normocephalic and atraumatic.  Eyes: Conjunctivae and EOM are normal. Pupils are equal, round, and reactive to light.  Neck: Normal range of motion. Neck supple. No tracheal deviation present. No thyromegaly present.  Cardiovascular: Normal rate, regular rhythm and normal heart sounds.  Pulmonary/Chest: Effort normal and breath sounds normal. No respiratory distress. He has no wheezes. He exhibits no tenderness.  Abdominal: Soft. Bowel sounds are normal. He exhibits no distension. There is no tenderness.  Musculoskeletal: Normal range of motion. He exhibits edema.  Neurological: He is disoriented. No cranial nerve deficit.  Moving around and opening his eyes voluntarily, not following any commands  Skin: Skin is warm and dry. No rash noted.  Psychiatric:  Confused and agitated at times so difficult to assess   LABORATORY PANEL:  Male CBC Recent Labs  Lab 01/23/17 0451  WBC 8.1  HGB 10.9*  HCT 33.3*  PLT 65*   ------------------------------------------------------------------------------------------------------------------ Chemistries  Recent Labs    Lab 01/22/17 0430 01/23/17 0451  NA 139 143  K 5.0 4.5  CL 113* 111  CO2 23 26  GLUCOSE 165* 147*  BUN 34* 39*  CREATININE 1.16 1.04  CALCIUM 6.2* 6.0*  MG 2.4  --   AST 18  --   ALT 14*  --   ALKPHOS 111  --   BILITOT 0.8  --    RADIOLOGY:  Dg Chest Port 1 View  Result Date: 01/23/2017 CLINICAL DATA:  Respiratory failure, atrial fibrillation, COPD, prostate malignancy, former smoker. EXAM: PORTABLE CHEST 1 VIEW COMPARISON:  Portable chest x-ray of January 22, 2017 FINDINGS: The lungs are reasonably well inflated. Persistent increased density is noted at the bases. The cardiac silhouette is enlarged and the pulmonary vascularity engorged. There is calcification in the wall of the aortic arch. The trachea and esophagus have been extubated. The left internal jugular venous catheter tip projects over the proximal SVC. IMPRESSION: Stable bilateral pleural effusions, cardiomegaly, and pulmonary vascular congestion consistent with CHF. Thoracic aortic atherosclerosis. Electronically Signed   By: David  Martinique M.D.   On: 01/23/2017 09:29   ASSESSMENT AND PLAN:  36 M with metastatic prostate cancer admitted with dyspnea and hematochezia. Had progressive blood loss anemia.   * Acute respiratory failure secondary to COPD exacerbation:  - elective intubation for EGD on 11/17 - extubated on 11/20. On n.c.  * Acute blood loss anemia secondary to GI bleed secondary to bleeding peptic ulcer.  Status post mesenteric angiogram, embolization. Hb 10.9 today - EGD showing One cratered duodenal ulcer with a visible vessel was found in the duodenal bulb s/p embolization - Stopped  anticoagulation due to GI bleed, patient received FFP, vitamin K,  3 packed RBC transfusion.   * Sepsis: ruled out, stopped all Abx  * Metastatic prostate cancer: Patient has diffuse skeletal metastases.  Getting treatment at [facility].   * Atrial fibrillation with RVR: On amiodarone drip.     Appreciate Palliative care  input - reading records - family leaning towards home with Hospice    All the records are reviewed and case discussed with Care Management/Social Worker. Management plans discussed with the patient, family, PCCM and they are in agreement.  CODE STATUS: DNR  TOTAL TIME TAKING CARE OF THIS PATIENT: 25 minutes.   More than 50% of the time was spent in counseling/coordination of care: YES  POSSIBLE D/C IN 2-3 DAYS, DEPENDING ON CLINICAL CONDITION.   Max Sane M.D on 01/23/2017 at 3:02 PM  Between 7am to 6pm - Pager - [phone]  After 6pm go to [email or web address] - Proofreader  [facility]  Office  [phone]  CC: Primary care physician; [facility]  Note: This dictation was prepared with Dragon dictation along with smaller phrase technology. Any transcriptional errors that result from this process are unintentional.

## 2017-01-23 NOTE — Progress Notes (Signed)
Slept for 1 hr after morphine 1 mg given. Than awakens agitated and moaning and groaning loudly. Hinton Dyer NP here and in to see.  Pt then given Morphine 2 mg IV and repositioned. Seems about to fall asleep and then agitation returns. Haldol 1mg  iv given. Precedex continues at 14mcg/kg/hr. Family remains quietly at bedside.

## 2017-01-23 NOTE — Progress Notes (Signed)
Nutrition Follow-up  DOCUMENTATION CODES:   Obesity unspecified  INTERVENTION:  No appropriate intervention at this time as patient is not alert enough for oral intake. Will continue to monitor discussions regarding goals of care and make appropriate nutrition interventions as warranted.  NUTRITION DIAGNOSIS:   Inadequate oral intake related to inability to eat as evidenced by NPO status.  Ongoing inadequate intake as patient is NPO following extubation.  GOAL:   Patient will meet greater than or equal to 90% of their needs  Not met.  MONITOR:   Diet advancement, Labs, Weight trends, Skin, I & O's  REASON FOR ASSESSMENT:   Ventilator    ASSESSMENT:   83 year old male with PMHx of COPD, metastatic prostate cancer on chemotherapy, HLD, A-fib who is admitted with septic shock, respiratory distress, GI bleed. Patient was intubated 11/17 for EGD, which found cratered duodenal ulcer with visible vessel s/p placement of four hemostatic clips, injection with epinephrine for hemostasis, and coagulation with bipolar cautery. Patient also s/p embolization by IR on 11/17.  Patient was extubated on 11/20. Tube feeds were stopped prior to extubation. Patient has been confused and agitated. PMT met with family and patient today. Now DNR with goal of going home with hospice. Patient is not safe for PO intake at this time. On abdominal x-ray from 11/20 there was improvement in ileus pattern and still no indication of bowel obstruction.  Medications reviewed and include: pantoprazole, amiodarone infusion, Precedex gtt, D5W at 40 mL/hr.  Labs reviewed: CBG 136-157, BUN 39, Calcium 6.  Diet Order:  No diet orders on file  EDUCATION NEEDS:   No education needs have been identified at this time  Skin:  Skin Assessment: Skin Integrity Issues: Skin Integrity Issues:: Other (Comment) Other: skin tear right arm; wounds to bilateral legs with dressings in place  Last BM:  01/20/2017 - medium  black type 7  Height:   Ht Readings from Last 1 Encounters:  01/10/2017 _0  (1.854 m)    Weight:   Wt Readings from Last 1 Encounters:  01/23/17 248 lb 3.8 oz (112.6 kg)    Ideal Body Weight:  83.6 kg  BMI:  Body mass index is 32.75 kg/m.  Estimated Nutritional Needs:   Kcal:  2210-2400 (MSJ x 1.2-1.3)  Protein:  110-130 grams (1-1.2 grams/kg)  Fluid:  2 L/day (25 mL/kg IBW)  Willey Blade, MS, RD, LDN Office: [phone] Pager: [phone] After Hours/Weekend Pager: [phone]

## 2017-01-23 NOTE — Progress Notes (Signed)
PULMONARY / CRITICAL CARE MEDICINE   Name: Chad Kirby MRN: [id number] DOB: November 22, 1929    ADMISSION DATE:  01/17/2017  PT PROFILE:   54 M with metastatic prostate cancer treated at [facility].  Admitted to SDU 01/29/2017 with dyspnea and hematochezia.  Initially treated as a severe sepsis with unclear source.  Had progressive blood loss anemia.  Underwent intubation for EGD 11/17.   MAJOR EVENTS/TEST RESULTS: 11/16 admitted as above with dyspnea and hematochezia 11/17 elective intubation for EGD 11/17 EGD: One cratered duodenal ulcer with a visible vessel was found in the duodenal bulb. The lesion was 3 mm in largest dimension. For hemostasis,   four hemostatic clips were successfully placed. Area was successfully injected with of a 1:10,000 solution of epinephrine for hemostasis. Coagulation for hemostasis using bipolar probe was successful. IR consulted for embolization, as large cratered ulcer with deep large visible vessel is at high risk of rebleeding 11/17 IR procedure: Mesenteric arteriogram, GDA embolization 11/18 KUB: diffuse distention of the stomach and small bowel, and air also noted within the colon 11/19 Requiring PEEP 8 and FiO2 45%. No weaning of vent. Weaned off of vasopressors. Myoclonic-like activity resolved with discontinuation of fentanyl infusion and transition to propofol.  11/20 passed SBT.  All sedation discontinued.  Took extended time to "wake up".  Extubated.  Remains encephalopathic post extubation but appears to be tolerating. 11/21 remains encephalopathic and agitated.  Remains on dexmedetomidine infusion.  Palliative care consultation requested.  Patient desires full DNR with hopes of getting him home with hospice.  INDWELLING DEVICES:: ETT 11/17 >> 11/20 L IJ CVL 11/17 >>   MICRO DATA: MRSA PCR 11/16 >> NEG Blood 11/16 >> NEG   ANTIMICROBIALS:  Vanc 11/16 >> 11/19 Pip-tazo 11/16 >> 11/19    SUBJECTIVE:  RASS 0.  Moaning and crying out.  Not F/C  consistently  VITAL SIGNS: BP 122/75   Pulse 89   Temp 97.8 F (36.6 C) (Axillary)   Resp (!) 23   Ht 6\' 1"  (1.854 m)   Wt 112.6 kg (248 lb 3.8 oz)   SpO2 98%   BMI 32.75 kg/m   HEMODYNAMICS:    VENTILATOR SETTINGS:    INTAKE / OUTPUT: I/O last 3 completed shifts: In: 2465 [I.V.:2235; NG/GT:120; IV Piggyback:110] Out: 2395 [Urine:2245; Emesis/NG output:150]  PHYSICAL EXAMINATION: General: No respiratory distress, agitated Neuro: PERRL, EOMI, MAEs HEENT: NCAT, alopecia Cardiovascular: IRIR, mildly tachy, no M noted Lungs: clear anteriorly Abdomen: mildlt distended, diminished BS Ext: Symmetric pretibial edema  LABS:  BMET Recent Labs  Lab 01/21/17 0524 01/22/17 0430 01/23/17 0451  NA 139 139 143  K 3.5 5.0 4.5  CL 112* 113* 111  CO2 19* 23 26  BUN 26* 34* 39*  CREATININE 0.84 1.16 1.04  GLUCOSE 180* 165* 147*    Electrolytes Recent Labs  Lab 01/20/17 0421 01/21/17 0524 01/22/17 0430 01/23/17 0451  CALCIUM  --  5.6* 6.2* 6.0*  MG 2.1 2.1 2.4  --   PHOS 2.5 1.7* 2.0*  --     CBC Recent Labs  Lab 01/22/17 0430 01/22/17 1245 01/23/17 0451  WBC 10.7* 15.5* 8.1  HGB 11.6* 12.5* 10.9*  HCT 33.3* 38.4* 33.3*  PLT 73* 93* 65*    Coag's Recent Labs  Lab 01/29/2017 0812 01/16/2017 2158 01/14/2017 0759 01/26/2017 2043  APTT 43*  --   --  26  INR 2.91 2.44 1.46 1.15    Sepsis Markers Recent Labs  Lab 01/08/2017 0812 01/28/2017 0237  01/10/2017  1837 01/20/17 0421 01/20/17 1024 01/21/17 0524  LATICACIDVEN  --   --    < > 2.1*  --  2.5* 1.2  PROCALCITON 0.32 0.45  --   --  0.39  --   --    < > = values in this interval not displayed.    ABG Recent Labs  Lab 01/23/2017 1546 01/22/2017 2033 01/20/17 0302  PHART PENDING 7.12* 7.31*  PCO2ART 63* 62* 37  PO2ART 65* 58* 66*    Liver Enzymes Recent Labs  Lab 01/08/2017 0452 01/10/2017 2043 01/21/17 0524 01/22/17 0430  AST 35  --  16 18  ALT 14*  --  13* 14*  ALKPHOS 179*  --  98 111  BILITOT  0.8  --  0.8 0.8  ALBUMIN 2.6* 2.7* 2.3* 2.3*    Cardiac Enzymes Recent Labs  Lab 01/31/2017 2158 01/03/2017 0237 01/28/2017 0759  TROPONINI <0.03 <0.03 <0.03    Glucose Recent Labs  Lab 01/21/17 1655 01/22/17 0020 01/22/17 0750 01/22/17 1651 01/23/17 0025 01/23/17 0731  GLUCAP 174* 162* 157* 145* 155* 136*    CXR: NSC bilateral pleural effusions   ASSESSMENT / PLAN:  PULMONARY A: Acute hypoxemic respiratory failure Initially intubated for EGD Pulmonary edema/pleural effusions - likely due to volume resuscitation P:   Continue supplemental O2 to maintain SPO2 >90% DNI   CARDIOVASCULAR A:  Chronic AF, rate adequately controlled Hemorrhagic shock -resolved P:  Cardiac and hemodynamic monitoring per ICU protocol I have recommended against resumption of anticoagulation DNR  RENAL A:   Mild hypernatremia P:   Monitor BMET intermittently Monitor I/Os Correct electrolytes as indicated  D5W added 11/21  GASTROINTESTINAL A:   Duodenal ulcer - S/P EGD, S/P embolization Ileus pattern on KUB -improved P:   SUP: IV PPI NPO until cognition improves  HEM/ONC A:   Acute blood loss anemia -active bleeding Thrombocytopenia Metastatic prostate cancer P:  DVT px: SCDs Monitor CBC intermittently Transfuse per usual guidelines  I have recommended against resumption of chemotherapy  INFECTIOUS A:   No definite infection identified P:   Monitor temp, WBC count Micro and abx as above   ENDOCRINE A:   Stress induced hyperglycemia, mild P:   Continue CBG q 8 hrs Consider SSI for glu > 180  NEUROLOGIC A:   Acute encephalopathy/agitated delirium P:   RASS goal: 0 Continue dexmedetomidine infusion Low-dose haloperidol as needed Low-dose morphine as needed for pain  GOALS OF CARE: Discussed goals of care with patient's wife and daughters.  We all agree with full DNR.  We discussed the goal of home with hospice.  We discussed discontinuation of any  anticoagulation and any chemotherapy in the future.  I have requested palliative care consultation today to facilitate achieving the above goals.   Merton Border, MD PCCM service Mobile [phone] Pager [phone] 01/23/2017, 3:00 PM

## 2017-01-24 ENCOUNTER — Inpatient Hospital Stay: Payer: Medicare Other

## 2017-01-24 DIAGNOSIS — G9341 Metabolic encephalopathy: Secondary | ICD-10-CM

## 2017-01-24 LAB — BASIC METABOLIC PANEL
ANION GAP: 4 — AB (ref 5–15)
BUN: 28 mg/dL — ABNORMAL HIGH (ref 6–20)
CALCIUM: 5.6 mg/dL — AB (ref 8.9–10.3)
CO2: 24 mmol/L (ref 22–32)
CREATININE: 0.72 mg/dL (ref 0.61–1.24)
Chloride: 109 mmol/L (ref 101–111)
Glucose, Bld: 165 mg/dL — ABNORMAL HIGH (ref 65–99)
Potassium: 3.6 mmol/L (ref 3.5–5.1)
SODIUM: 137 mmol/L (ref 135–145)

## 2017-01-24 LAB — URINALYSIS, COMPLETE (UACMP) WITH MICROSCOPIC
Bacteria, UA: NONE SEEN
Bilirubin Urine: NEGATIVE
GLUCOSE, UA: NEGATIVE mg/dL
HGB URINE DIPSTICK: NEGATIVE
KETONES UR: 5 mg/dL — AB
LEUKOCYTES UA: NEGATIVE
Nitrite: NEGATIVE
PH: 7 (ref 5.0–8.0)
PROTEIN: NEGATIVE mg/dL
Specific Gravity, Urine: 1.013 (ref 1.005–1.030)
Squamous Epithelial / LPF: NONE SEEN

## 2017-01-24 LAB — CBC
HCT: 33.1 % — ABNORMAL LOW (ref 40.0–52.0)
HEMOGLOBIN: 11.1 g/dL — AB (ref 13.0–18.0)
MCH: 31.1 pg (ref 26.0–34.0)
MCHC: 33.4 g/dL (ref 32.0–36.0)
MCV: 93.1 fL (ref 80.0–100.0)
PLATELETS: 58 10*3/uL — AB (ref 150–440)
RBC: 3.56 MIL/uL — AB (ref 4.40–5.90)
RDW: 16.6 % — ABNORMAL HIGH (ref 11.5–14.5)
WBC: 7.5 10*3/uL (ref 3.8–10.6)

## 2017-01-24 LAB — GLUCOSE, CAPILLARY
Glucose-Capillary: 116 mg/dL — ABNORMAL HIGH (ref 65–99)
Glucose-Capillary: 124 mg/dL — ABNORMAL HIGH (ref 65–99)
Glucose-Capillary: 138 mg/dL — ABNORMAL HIGH (ref 65–99)
Glucose-Capillary: 140 mg/dL — ABNORMAL HIGH (ref 65–99)

## 2017-01-24 LAB — MAGNESIUM: MAGNESIUM: 2 mg/dL (ref 1.7–2.4)

## 2017-01-24 LAB — AMMONIA: Ammonia: 33 umol/L (ref 9–35)

## 2017-01-24 LAB — PHOSPHORUS: Phosphorus: 1.2 mg/dL — ABNORMAL LOW (ref 2.5–4.6)

## 2017-01-24 MED ORDER — SODIUM CHLORIDE 0.9 % IV SOLN
1.0000 g | Freq: Once | INTRAVENOUS | Status: AC
  Administered 2017-01-24: 1 g via INTRAVENOUS
  Filled 2017-01-24: qty 10

## 2017-01-24 MED ORDER — HALOPERIDOL LACTATE 5 MG/ML IJ SOLN
5.0000 mg | Freq: Three times a day (TID) | INTRAMUSCULAR | Status: DC
  Administered 2017-01-24 – 2017-01-26 (×4): 5 mg via INTRAVENOUS
  Filled 2017-01-24 (×5): qty 1

## 2017-01-24 NOTE — Progress Notes (Signed)
PULMONARY / CRITICAL CARE MEDICINE   Name: Chad Kirby MRN: [id number] DOB: 1929/05/13    ADMISSION DATE:  01/26/2017  PT PROFILE:   43 M with metastatic prostate cancer treated at [facility]  Admitted to SDU 01/26/2017 with dyspnea and hematochezia.  Initially treated as a severe sepsis with unclear source.  Had progressive blood loss anemia.  Underwent intubation for EGD 11/17.   MAJOR EVENTS/TEST RESULTS: 11/16 admitted as above with dyspnea and hematochezia 11/17 elective intubation for EGD 11/17 EGD: One cratered duodenal ulcer with a visible vessel was found in the duodenal bulb. The lesion was 3 mm in largest dimension. For hemostasis,   four hemostatic clips were successfully placed. Area was successfully injected with of a 1:10,000 solution of epinephrine for hemostasis. Coagulation for hemostasis using bipolar probe was successful. IR consulted for embolization, as large cratered ulcer with deep large visible vessel is at high risk of rebleeding 11/17 IR procedure: Mesenteric arteriogram, GDA embolization 11/18 KUB: diffuse distention of the stomach and small bowel, and air also noted within the colon 11/19 Requiring PEEP 8 and FiO2 45%. No weaning of vent. Weaned off of vasopressors. Myoclonic-like activity resolved with discontinuation of fentanyl infusion and transition to propofol.  11/20 passed SBT.  All sedation discontinued.  Took extended time to "wake up".  Extubated.  Remains encephalopathic post extubation but appears to be tolerating. 11/21 remains encephalopathic and agitated.  Remains on dexmedetomidine infusion.  Palliative care consultation requested.  Patient desires full DNR with hopes of getting him home with hospice. 11/22.  No significant change in condition.  Patient continues to be encephalopathic.  Patient's family reports that he opens eyes and answers questions and moves all extremities but does not recognize them.  INDWELLING DEVICES:: ETT 11/17 >> 11/20 L IJ  CVL 11/17 >>   MICRO DATA: MRSA PCR 11/16 >> NEG Blood 11/16 >> NEG   ANTIMICROBIALS:  Vanc 11/16 >> 11/19 Pip-tazo 11/16 >> 11/19    SUBJECTIVE:  RASS 0.  Moaning and crying out.  Not F/C consistently  VITAL SIGNS: BP 127/78   Pulse (!) 103   Temp (!) 97.4 F (36.3 C) (Axillary)   Resp (!) 25   Ht 6\' 1"  (1.854 m)   Wt 254 lb 10.1 oz (115.5 kg)   SpO2 97%   BMI 33.59 kg/m    INTAKE / OUTPUT: I/O last 3 completed shifts: In: 2802 [I.V.:2692; IV Piggyback:110] Out: 2830 [Urine:2830]  PHYSICAL EXAMINATION: General: No respiratory distress, agitated but does not follow commands Neuro: PERRL, EOMI, does not follow commands HEENT: NCAT, alopecia Cardiovascular: IRIR, mildly tachy, no M noted Lungs: clear anteriorly Abdomen: Soft, nontender, no organomegaly, bowel sounds present Ext: Symmetric pretibial edema  LABS:  BMET Recent Labs  Lab 01/22/17 0430 01/23/17 0451 01/24/17 0440  NA 139 143 137  K 5.0 4.5 3.6  CL 113* 111 109  CO2 23 26 24   BUN 34* 39* 28*  CREATININE 1.16 1.04 0.72  GLUCOSE 165* 147* 165*    Electrolytes Recent Labs  Lab 01/21/17 0524 01/22/17 0430 01/23/17 0451 01/24/17 0440 01/24/17 1015  CALCIUM 5.6* 6.2* 6.0* 5.6*  --   MG 2.1 2.4  --   --  2.0  PHOS 1.7* 2.0*  --   --  1.2*    CBC Recent Labs  Lab 01/22/17 1245 01/23/17 0451 01/24/17 0440  WBC 15.5* 8.1 7.5  HGB 12.5* 10.9* 11.1*  HCT 38.4* 33.3* 33.1*  PLT 93* 65* 58*  Coag's Recent Labs  Lab 01/25/2017 0812 01/26/2017 2158 01/10/2017 0759 01/10/2017 2043  APTT 43*  --   --  26  INR 2.91 2.44 1.46 1.15    Sepsis Markers Recent Labs  Lab 01/14/2017 0812 01/26/2017 0237  01/25/2017 1837 01/20/17 0421 01/20/17 1024 01/21/17 0524  LATICACIDVEN  --   --    < > 2.1*  --  2.5* 1.2  PROCALCITON 0.32 0.45  --   --  0.39  --   --    < > = values in this interval not displayed.    ABG Recent Labs  Lab 01/20/2017 1546 01/17/2017 2033 01/20/17 0302  PHART PENDING  7.12* 7.31*  PCO2ART 63* 62* 37  PO2ART 65* 58* 66*    Liver Enzymes Recent Labs  Lab 01/22/2017 0452 01/17/2017 2043 01/21/17 0524 01/22/17 0430  AST 35  --  16 18  ALT 14*  --  13* 14*  ALKPHOS 179*  --  98 111  BILITOT 0.8  --  0.8 0.8  ALBUMIN 2.6* 2.7* 2.3* 2.3*    Cardiac Enzymes Recent Labs  Lab 01/21/2017 2158 01/29/2017 0237 01/26/2017 0759  TROPONINI <0.03 <0.03 <0.03    Glucose Recent Labs  Lab 01/22/17 1651 01/23/17 0025 01/23/17 0731 01/23/17 1612 01/24/17 0005 01/24/17 0734  GLUCAP 145* 155* 136* 133* 140* 124*    CXR: NSC bilateral pleural effusions   ASSESSMENT / PLAN:  PULMONARY A: Acute hypoxemic respiratory failure improved Initially intubated for EGD Pulmonary edema/pleural effusions - likely due to volume resuscitation P:   O2 to maintain SPO2 >90% DNI   CARDIOVASCULAR A:  Chronic AF, rate adequately controlled Hemorrhagic shock -resolved P:  Cardiac and hemodynamic monitoring per ICU protocol I have recommended against resumption of anticoagulation DNR  RENAL A:   Acute kidney injury improving P:   Continue to monitor urine output and creatinine closely  GASTROINTESTINAL A:   Duodenal ulcer - S/P EGD, S/P embolization Ileus pattern on KUB -improved P:   SUP: IV PPI NPO until cognition improves.  Will nasotracheally suction.  HEM/ONC A:   Acute blood loss anemia -active bleeding Thrombocytopenia Metastatic prostate cancer P:  DVT px: SCDs Monitor CBC intermittently Transfuse per usual guidelines  I have recommended against resumption of chemotherapy  INFECTIOUS A:   No definite infection identified P:   Monitor temp, WBC count Micro and abx as above   ENDOCRINE A:   Stress induced hyperglycemia, mild P:   Continue CBG q 8 hrs Consider SSI for glu > 180  NEUROLOGIC A:   Acute encephalopathy/agitated delirium P:   Monitor neuro status closely.  We will check ammonia level.  Check CT head.  GOALS OF  CARE: I discussed goal of care For the patient's wife and kids.  They want to hold off on hospice at this time but are willing to discuss it if there is no clinical improvement in his condition.     Dimas Chyle MD PCCM

## 2017-01-24 NOTE — Progress Notes (Signed)
[place], NP notified of a calcium of 5.6. Will place new orders.

## 2017-01-25 DIAGNOSIS — J9601 Acute respiratory failure with hypoxia: Secondary | ICD-10-CM

## 2017-01-25 DIAGNOSIS — R0600 Dyspnea, unspecified: Secondary | ICD-10-CM

## 2017-01-25 DIAGNOSIS — K117 Disturbances of salivary secretion: Secondary | ICD-10-CM

## 2017-01-25 LAB — BASIC METABOLIC PANEL
Anion gap: 5 (ref 5–15)
BUN: 17 mg/dL (ref 6–20)
CALCIUM: 5.4 mg/dL — AB (ref 8.9–10.3)
CO2: 24 mmol/L (ref 22–32)
CREATININE: 0.63 mg/dL (ref 0.61–1.24)
Chloride: 107 mmol/L (ref 101–111)
GFR calc non Af Amer: >60 mL/min (ref >60)
Glucose, Bld: 148 mg/dL — ABNORMAL HIGH (ref 65–99)
Potassium: 3.4 mmol/L — ABNORMAL LOW (ref 3.5–5.1)
Sodium: 136 mmol/L (ref 135–145)

## 2017-01-25 LAB — T4, FREE: Free T4: 1.3 ng/dL — ABNORMAL HIGH (ref 0.61–1.12)

## 2017-01-25 LAB — PHOSPHORUS: PHOSPHORUS: 1.5 mg/dL — AB (ref 2.5–4.6)

## 2017-01-25 LAB — GLUCOSE, CAPILLARY
Glucose-Capillary: 139 mg/dL — ABNORMAL HIGH (ref 65–99)
Glucose-Capillary: 151 mg/dL — ABNORMAL HIGH (ref 65–99)

## 2017-01-25 LAB — MAGNESIUM: Magnesium: 1.8 mg/dL (ref 1.7–2.4)

## 2017-01-25 LAB — ALBUMIN: Albumin: 2 g/dL — ABNORMAL LOW (ref 3.5–5.0)

## 2017-01-25 MED ORDER — POTASSIUM PHOSPHATES 15 MMOLE/5ML IV SOLN
30.0000 mmol | Freq: Once | INTRAVENOUS | Status: AC
  Administered 2017-01-25: 30 mmol via INTRAVENOUS
  Filled 2017-01-25: qty 10

## 2017-01-25 MED ORDER — MAGNESIUM SULFATE IN D5W 1-5 GM/100ML-% IV SOLN
1.0000 g | Freq: Once | INTRAVENOUS | Status: AC
  Administered 2017-01-25: 1 g via INTRAVENOUS
  Filled 2017-01-25: qty 100

## 2017-01-25 MED ORDER — GLYCOPYRROLATE 0.2 MG/ML IJ SOLN
0.2000 mg | Freq: Three times a day (TID) | INTRAMUSCULAR | Status: DC
  Administered 2017-01-25 – 2017-01-26 (×6): 0.2 mg via INTRAVENOUS
  Filled 2017-01-25 (×6): qty 1

## 2017-01-25 MED ORDER — SODIUM CHLORIDE 0.9 % IV SOLN
1.0000 g | Freq: Once | INTRAVENOUS | Status: AC
  Administered 2017-01-25: 1 g via INTRAVENOUS
  Filled 2017-01-25: qty 10

## 2017-01-25 NOTE — Progress Notes (Signed)
Spoke to Dr. Jimmy Footman early in shift about patient needing to go to CT head and sedation was not working.  Able to go after sedated with Precedex at 2 Mcg. Was given haldol 5 and morphine 2. CT head was negative.  Medicated once again with morphine and haldol this AM or extreme restlessness, and comfort. Daughter in law at bedside. CT head negative except for chronic changes. Bilateral leg dressings changed. Turned and repositioned as needed. Had a sn treatment at night for congestion. No other concerns at this time, other than patient was restless on and off, but showed no sign of pain.

## 2017-01-25 NOTE — Progress Notes (Signed)
[place] at [place] NAME: Chad Kirby    MR#:  [id number]  DATE OF BIRTH:  October 30, 1929  SUBJECTIVE:  CHIEF COMPLAINT:   Chief Complaint  Patient presents with  . Shortness of Breath  . Rectal Bleeding  extubated and remains on N.C. Was agitated requiring precedex, family at bedside. REVIEW OF SYSTEMS:  Review of Systems  Unable to perform ROS: Critical illness   DRUG ALLERGIES:   Allergies  Allergen Reactions  . Fentanyl     Myoclonus  . Latex Rash   VITALS:  Blood pressure 92/61, pulse 83, temperature 98.8 F (37.1 C), temperature source Axillary, resp. rate 17, height 6\' 1"  (1.854 m), weight 109.7 kg (241 lb 13.5 oz), SpO2 99 %. PHYSICAL EXAMINATION:  Physical Exam  Constitutional: He is well-developed, well-nourished, and in no distress.  HENT:  Head: Normocephalic and atraumatic.  Eyes: Conjunctivae and EOM are normal. Pupils are equal, round, and reactive to light.  Neck: Normal range of motion. Neck supple. No tracheal deviation present. No thyromegaly present.  Cardiovascular: Normal rate, regular rhythm and normal heart sounds.  Pulmonary/Chest: Effort normal and breath sounds normal. No respiratory distress. He has no wheezes. He exhibits no tenderness.  Abdominal: Soft. Bowel sounds are normal. He exhibits no distension. There is no tenderness.  Musculoskeletal: Normal range of motion. He exhibits edema.  Neurological: He is disoriented. No cranial nerve deficit.  Moving around and opening his eyes voluntarily, not following any commands  Skin: Skin is warm and dry. No rash noted.  Psychiatric:  Confused and agitated at times so difficult to assess   LABORATORY PANEL:  Male CBC Recent Labs  Lab 01/24/17 0440  WBC 7.5  HGB 11.1*  HCT 33.1*  PLT 58*   ------------------------------------------------------------------------------------------------------------------ Chemistries  Recent Labs  Lab  01/22/17 0430  01/25/17 1111  NA 139   < > 136  K 5.0   < > 3.4*  CL 113*   < > 107  CO2 23   < > 24  GLUCOSE 165*   < > 148*  BUN 34*   < > 17  CREATININE 1.16   < > 0.63  CALCIUM 6.2*   < > 5.4*  MG 2.4   < > 1.8  AST 18  --   --   ALT 14*  --   --   ALKPHOS 111  --   --   BILITOT 0.8  --   --    < > = values in this interval not displayed.   RADIOLOGY:  Ct Head Wo Contrast  Result Date: 01/24/2017 CLINICAL DATA:  Altered level of consciousness.  Delirium. EXAM: CT HEAD WITHOUT CONTRAST TECHNIQUE: Contiguous axial images were obtained from the base of the skull through the vertex without intravenous contrast. COMPARISON:  08/04/2015. FINDINGS: Brain: Chronic stable superficial atrophy with moderate small vessel ischemic disease of periventricular and subcortical white matter. No hydrocephalus, hemorrhage, midline shift nor large vascular territory infarct. No intra-axial mass nor extra-axial fluid collections. No effacement of the basal cisterns or fourth ventricle. Vascular: No hyperdense vessels. Skull: Bilateral mastoid effusions right greater left. No skull fracture or suspicious osseous lesions. Sinuses/Orbits: Mild ethmoid sinus mucosal thickening. Intact orbits and globes. Other: None Electronically Signed   By: Ashley Royalty M.D.   On: 01/24/2017 23:47   ASSESSMENT AND PLAN:  56 M with metastatic prostate cancer admitted with dyspnea and hematochezia. Had progressive blood loss anemia.   *  Acute respiratory failure secondary to COPD exacerbation:  - elective intubation for EGD on 11/17 - extubated on 11/20. On n.c.  * Acute blood loss anemia secondary to GI bleed secondary to bleeding peptic ulcer.  Status post mesenteric angiogram, embolization. Hb stable - EGD showing One cratered duodenal ulcer with a visible vessel was found in the duodenal bulb s/p embolization - Stopped anticoagulation due to GI bleed, patient received FFP, vitamin K,  3 packed RBC transfusion.   *  Sepsis due to UTI: ruled out, stopped all Abx ( After 3 days)  * Metastatic prostate cancer: Patient has diffuse skeletal metastases.  Getting treatment at [facility].   * Atrial fibrillation with RVR: On amiodarone drip.  may switch to oral metoprolol once alert.  * Acute metabolic encephalopathy   Check Ammonia, UA, CT head.   As per his daughters, in past he had prolonged awakening after critical illness.  Appreciate Palliative care input - reading records - family leaning towards home with Hospice    All the records are reviewed and case discussed with Care Management/Social Worker. Management plans discussed with the patient, family, PCCM and they are in agreement.  CODE STATUS: DNR  TOTAL TIME TAKING CARE OF THIS PATIENT: 35 minutes.   More than 50% of the time was spent in counseling/coordination of care: YES  POSSIBLE D/C IN 2-3 DAYS, DEPENDING ON CLINICAL CONDITION.   Vaughan Basta M.D on 01/25/2017 at 4:30 PM  Between 7am to 6pm - Pager - [phone]  After 6pm go to [email or web address] - Proofreader  [facility] [place]  Office  [phone]  CC: Primary care physician; [facility]  Note: This dictation was prepared with Dragon dictation along with smaller phrase technology. Any transcriptional errors that result from this process are unintentional.

## 2017-01-25 NOTE — Progress Notes (Signed)
RN took over care of patient at 3pm, RN in room to give meds at 4pm but family requested to hold off on haldol IV push, patient resting without agitation, will continue to monitor.

## 2017-01-25 NOTE — Progress Notes (Signed)
[place] at [place] NAME: London Tarnowski    MR#:  [id number]  DATE OF BIRTH:  September 10, 1929  SUBJECTIVE:  CHIEF COMPLAINT:   Chief Complaint  Patient presents with  . Shortness of Breath  . Rectal Bleeding  extubated and remains on N.C. Was agitated requiring precedex, family at bedside. Still not able to be alert without agitation.  REVIEW OF SYSTEMS:  Review of Systems  Unable to perform ROS: Critical illness   DRUG ALLERGIES:   Allergies  Allergen Reactions  . Fentanyl     Myoclonus  . Latex Rash   VITALS:  Blood pressure 92/61, pulse 83, temperature 98.8 F (37.1 C), temperature source Axillary, resp. rate 17, height 6\' 1"  (1.854 m), weight 109.7 kg (241 lb 13.5 oz), SpO2 99 %. PHYSICAL EXAMINATION:  Physical Exam  Constitutional: He is well-developed, well-nourished, and in no distress.  HENT:  Head: Normocephalic and atraumatic.  Eyes: Conjunctivae and EOM are normal. Pupils are equal, round, and reactive to light.  Neck: Normal range of motion. Neck supple. No tracheal deviation present. No thyromegaly present.  Cardiovascular: Normal rate, regular rhythm and normal heart sounds.  Pulmonary/Chest: Effort normal and breath sounds normal. No respiratory distress. He has no wheezes. He exhibits no tenderness.  Abdominal: Soft. Bowel sounds are normal. He exhibits no distension. There is no tenderness.  Musculoskeletal: Normal range of motion. He exhibits edema.  Neurological: He is disoriented. No cranial nerve deficit.  Moving around and opening his eyes voluntarily, not following any commands  Skin: Skin is warm and dry. No rash noted.  Psychiatric:  Confused and agitated at times so difficult to assess   LABORATORY PANEL:  Male CBC Recent Labs  Lab 01/24/17 0440  WBC 7.5  HGB 11.1*  HCT 33.1*  PLT 58*    ------------------------------------------------------------------------------------------------------------------ Chemistries  Recent Labs  Lab 01/22/17 0430  01/25/17 1111  NA 139   < > 136  K 5.0   < > 3.4*  CL 113*   < > 107  CO2 23   < > 24  GLUCOSE 165*   < > 148*  BUN 34*   < > 17  CREATININE 1.16   < > 0.63  CALCIUM 6.2*   < > 5.4*  MG 2.4   < > 1.8  AST 18  --   --   ALT 14*  --   --   ALKPHOS 111  --   --   BILITOT 0.8  --   --    < > = values in this interval not displayed.   RADIOLOGY:  Ct Head Wo Contrast  Result Date: 01/24/2017 CLINICAL DATA:  Altered level of consciousness.  Delirium. EXAM: CT HEAD WITHOUT CONTRAST TECHNIQUE: Contiguous axial images were obtained from the base of the skull through the vertex without intravenous contrast. COMPARISON:  08/04/2015. FINDINGS: Brain: Chronic stable superficial atrophy with moderate small vessel ischemic disease of periventricular and subcortical white matter. No hydrocephalus, hemorrhage, midline shift nor large vascular territory infarct. No intra-axial mass nor extra-axial fluid collections. No effacement of the basal cisterns or fourth ventricle. Vascular: No hyperdense vessels. Skull: Bilateral mastoid effusions right greater left. No skull fracture or suspicious osseous lesions. Sinuses/Orbits: Mild ethmoid sinus mucosal thickening. Intact orbits and globes. Other: None Electronically Signed   By: Ashley Royalty M.D.   On: 01/24/2017 23:47   ASSESSMENT AND PLAN:  35 M with metastatic prostate cancer admitted with dyspnea and  hematochezia. Had progressive blood loss anemia.   * Acute respiratory failure secondary to COPD exacerbation:  - elective intubation for EGD on 11/17 - extubated on 11/20. On n.c.  * Acute blood loss anemia secondary to GI bleed secondary to bleeding peptic ulcer.  Status post mesenteric angiogram, embolization. Hb stable - EGD showing One cratered duodenal ulcer with a visible vessel was  found in the duodenal bulb s/p embolization - Stopped anticoagulation due to GI bleed, patient received FFP, vitamin K,  3 packed RBC transfusion.   * Sepsis due to UTI: ruled out, stopped all Abx ( After 3 days)  * Metastatic prostate cancer: Patient has diffuse skeletal metastases.  Getting treatment at [facility].   * Atrial fibrillation with RVR: On amiodarone drip.  may switch to oral metoprolol once alert.  * Acute metabolic encephalopathy   Check Ammonia, UA, CT head.   As per his daughters, in past he had prolonged awakening after critical illness.  * Hypocalcemia and Hypophosphatemia   Check PTH.  * hypothyroidism   Check Free T3, Free T4  Appreciate Palliative care input - reading records - family leaning towards home with Hospice   Discussion happened again today.  All the records are reviewed and case discussed with Care Management/Social Worker. Management plans discussed with the patient, family, PCCM and they are in agreement.  CODE STATUS: DNR  TOTAL TIME TAKING CARE OF THIS PATIENT: 35 minutes.   More than 50% of the time was spent in counseling/coordination of care: YES  POSSIBLE D/C IN 2-3 DAYS, DEPENDING ON CLINICAL CONDITION.   Vaughan Basta M.D on 01/25/2017 at 4:37 PM  Between 7am to 6pm - Pager - [phone]  After 6pm go to [email or web address] - Proofreader  [facility] [place] [facility]  Office  [phone]  CC: Primary care physician; [facility]  Note: This dictation was prepared with Dragon dictation along with smaller phrase technology. Any transcriptional errors that result from this process are unintentional.

## 2017-01-25 NOTE — Progress Notes (Signed)
[place] for electrolyte management   Pharmacy consulted for electrolyte management for 83 yo male critical care patient admitted with multiple co morbidities. Patient's corrected calcium is 6.6.   Plan:  Will order potassium phosphate 54mmol IV x 1. Will order magnesium 1g IV x 1. Will order calcium gluconate 1g IV x1. Will recheck electrolytes with am labs. Goal potassium > 4, goal magnesium > 2, goal corrected calcium > 8.   Allergies  Allergen Reactions  . Fentanyl     Myoclonus  . Latex Rash    Patient Measurements: Height: 6\' 1"  (185.4 cm) Weight: 241 lb 13.5 oz (109.7 kg) IBW/kg (Calculated) : 79.9  Vital Signs: Temp: 98.8 F (37.1 C) (11/23 1200) Temp Source: Axillary (11/23 1200) BP: 118/66 (11/23 1900) Pulse Rate: 89 (11/23 1900) Intake/Output from previous day: 11/22 0701 - 11/23 0700 In: 2492.3 [I.V.:2492.3] Out: 3050 [Urine:3050] Intake/Output from this shift: No intake/output data recorded.  Labs: Recent Labs    01/23/17 0451 01/24/17 0440 01/24/17 1015 01/25/17 1111  WBC 8.1 7.5  --   --   HGB 10.9* 11.1*  --   --   HCT 33.3* 33.1*  --   --   PLT 65* 58*  --   --   CREATININE 1.04 0.72  --  0.63  MG  --   --  2.0 1.8  PHOS  --   --  1.2* 1.5*  ALBUMIN  --   --   --  2.0*   Estimated Creatinine Clearance: 87.7 mL/min (by C-G formula based on SCr of 0.63 mg/dL).   Medical History: Past Medical History:  Diagnosis Date  . Atrial fibrillation ([place])   . Cancer [facility])    Prostate with bone mets-current chemo treatment  . COPD (chronic obstructive pulmonary disease) ([place])   . Gastric ulcer   . Hyperlipidemia     Pharmacy will continue to monitor and adjust per consult.  Ethleen Lormand L 01/25/2017,7:11 PM

## 2017-01-25 NOTE — Progress Notes (Signed)
Patient ID: Chad Kirby, male   DOB: [birth year], 83 y.o.   MRN: [id number]   This NP visited patient at the bedside as a follow up to [place], for palliative needs and emotional support.  I first spoke this morning with patient's wife and his daughter-in-law Diane regarding the current medical situation and my belief that  the patient is transitioning at the end of life.  Then had the same conversation later in the afternoon with 3 of his daughters.  Detailed discussion regarding natural trajectory at end of life and the concept of mortality.  Questions  and concerns addressed.  Discussed with family the importance of considerations regarding overall plan of care and treatment options,  ensuring decisions are within the context of the patients values and GOCs and in the best interest of the patient himself.  Detail the difference between an aggressive medical intervention path in the palliative comfort path.  I shared with the family that anything could happen at any time and my belief that the patient's prognosis was likely hours to days.; patient appears to be transitioning at EOL.  Family will discuss with critical care doctor,  decisions regarding transition of care into the next hours to days.  This is difficult for this family and that some family members are leaning towards a comfort approach and others remain hopeful for improvement.  Time in  0830         Time out   0930    Total time spent on the unit was 60 minutes  Greater than 50% of the time was spent in counseling and coordination of care  Wadie Lessen NP  Palliative Medicine Team Team Phone # [phone] Pager [phone]

## 2017-01-26 DIAGNOSIS — J9621 Acute and chronic respiratory failure with hypoxia: Secondary | ICD-10-CM

## 2017-01-26 DIAGNOSIS — J9601 Acute respiratory failure with hypoxia: Secondary | ICD-10-CM

## 2017-01-26 LAB — CALCIUM, IONIZED: CALCIUM, IONIZED, SERUM: 3.6 mg/dL — AB (ref 4.5–5.6)

## 2017-01-26 LAB — BASIC METABOLIC PANEL
Anion gap: 6 (ref 5–15)
BUN: 15 mg/dL (ref 6–20)
CALCIUM: 5.4 mg/dL — AB (ref 8.9–10.3)
CHLORIDE: 105 mmol/L (ref 101–111)
CO2: 23 mmol/L (ref 22–32)
CREATININE: 0.61 mg/dL (ref 0.61–1.24)
GFR calc non Af Amer: >60 mL/min (ref >60)
GLUCOSE: 141 mg/dL — AB (ref 65–99)
Potassium: 3.8 mmol/L (ref 3.5–5.1)
Sodium: 134 mmol/L — ABNORMAL LOW (ref 135–145)

## 2017-01-26 LAB — GLUCOSE, CAPILLARY
Glucose-Capillary: 129 mg/dL — ABNORMAL HIGH (ref 65–99)
Glucose-Capillary: 146 mg/dL — ABNORMAL HIGH (ref 65–99)

## 2017-01-26 LAB — BLOOD GAS, ARTERIAL
Acid-base deficit: 12.3 mmol/L — ABNORMAL HIGH (ref 0.0–2.0)
BICARBONATE: 18.3 mmol/L — AB (ref 20.0–28.0)
FIO2: 0.8
LHR: 15 {breaths}/min
O2 SAT: 81.5 %
PATIENT TEMPERATURE: 37
PCO2 ART: 63 mmHg — AB (ref 32.0–48.0)
PO2 ART: 65 mmHg — AB (ref 83.0–108.0)
VT: 500 mL

## 2017-01-26 LAB — URINE CULTURE: CULTURE: NO GROWTH

## 2017-01-26 LAB — MAGNESIUM: Magnesium: 1.9 mg/dL (ref 1.7–2.4)

## 2017-01-26 LAB — PHOSPHORUS: Phosphorus: 1.6 mg/dL — ABNORMAL LOW (ref 2.5–4.6)

## 2017-01-26 LAB — T3, FREE: T3, Free: 1.3 pg/mL — ABNORMAL LOW (ref 2.0–4.4)

## 2017-01-26 LAB — PARATHYROID HORMONE, INTACT (NO CA): PTH: 46 pg/mL (ref 15–65)

## 2017-01-26 MED ORDER — SODIUM CHLORIDE 0.9 % IV SOLN
1.0000 mg/h | INTRAVENOUS | Status: DC
  Administered 2017-01-26 (×2): 5 mg/h via INTRAVENOUS
  Filled 2017-01-26 (×3): qty 10

## 2017-01-26 MED ORDER — SODIUM CHLORIDE 0.9 % IV SOLN
2.0000 g | Freq: Once | INTRAVENOUS | Status: AC
  Administered 2017-01-26: 2 g via INTRAVENOUS
  Filled 2017-01-26: qty 20

## 2017-01-26 MED ORDER — LORAZEPAM 2 MG/ML IJ SOLN
2.0000 mg | INTRAMUSCULAR | Status: DC | PRN
  Administered 2017-01-26: 2 mg via INTRAVENOUS
  Filled 2017-01-26 (×2): qty 1

## 2017-01-26 MED ORDER — POTASSIUM PHOSPHATES 15 MMOLE/5ML IV SOLN
35.0000 mmol | Freq: Once | INTRAVENOUS | Status: DC
  Administered 2017-01-26: 35 mmol via INTRAVENOUS
  Filled 2017-01-26: qty 11.67

## 2017-01-26 MED ORDER — MAGNESIUM SULFATE 2 GM/50ML IV SOLN
2.0000 g | Freq: Once | INTRAVENOUS | Status: AC
  Administered 2017-01-26: 2 g via INTRAVENOUS
  Filled 2017-01-26: qty 50

## 2017-01-26 NOTE — Progress Notes (Signed)
Cont with comfort care with Morphine gtt @10mg /hr.  Boluses as needed for groaning and rattle.  Family at [facility].  BA

## 2017-01-26 NOTE — Progress Notes (Signed)
Spoke with [facility]-Link MD regarding increasing the dose of patient's morphine drip. Boluses also requested. Patient is having increased respiratory distress.

## 2017-01-26 NOTE — Progress Notes (Signed)
[place] Progress Note Patient Name: DOMIQUE CLAPPER DOB: Mar 11, 1929 MRN: [id number]   Date of Service  01/26/2017  HPI/Events of Note    eICU Interventions  MSO4 gtt adjusted to allow titration for pt sx     Intervention Category Major Interventions: Other:  Collene Gobble 01/26/2017, 11:27 PM

## 2017-01-26 NOTE — Progress Notes (Signed)
[place] at [place] NAME: Chad Kirby    MR#:  [id number]  DATE OF BIRTH:  [birth year]  SUBJECTIVE:  CHIEF COMPLAINT:   Chief Complaint  Patient presents with  . Shortness of Breath  . Rectal Bleeding  extubated and remains on N.C. Was agitated requiring precedex, family at bedside. Still not able to be alert without agitation.  Family is waiting for his son to arrive to make him comfort care.  REVIEW OF SYSTEMS:  Review of Systems  Unable to perform ROS: Critical illness   DRUG ALLERGIES:   Allergies  Allergen Reactions  . Fentanyl     Myoclonus  . Latex Rash   VITALS:  Blood pressure 101/63, pulse 95, temperature (!) 97.5 F (36.4 C), resp. rate 19, height 6\' 1"  (1.854 m), weight 110.2 kg (242 lb 15.2 oz), SpO2 95 %. PHYSICAL EXAMINATION:  Physical Exam  Constitutional: He is well-developed, well-nourished, and in no distress.  HENT:  Head: Normocephalic and atraumatic.  Eyes: Conjunctivae and EOM are normal. Pupils are equal, round, and reactive to light.  Neck: Normal range of motion. Neck supple. No tracheal deviation present. No thyromegaly present.  Cardiovascular: Normal rate, regular rhythm and normal heart sounds.  Pulmonary/Chest: Effort normal and breath sounds normal. No respiratory distress. He has no wheezes. He exhibits no tenderness.  Abdominal: Soft. Bowel sounds are normal. He exhibits no distension. There is no tenderness.  Musculoskeletal: Normal range of motion. He exhibits edema.  Neurological: He is disoriented. No cranial nerve deficit.  Moving around and opening his eyes voluntarily, not following any commands  Skin: Skin is warm and dry. No rash noted.  Psychiatric:  Confused and agitated at times so difficult to assess   LABORATORY PANEL:  Male CBC Recent Labs  Lab 01/24/17 0440  WBC 7.5  HGB 11.1*  HCT 33.1*  PLT 58*    ------------------------------------------------------------------------------------------------------------------ Chemistries  Recent Labs  Lab 01/22/17 0430  01/26/17 0412  NA 139   < > 134*  K 5.0   < > 3.8  CL 113*   < > 105  CO2 23   < > 23  GLUCOSE 165*   < > 141*  BUN 34*   < > 15  CREATININE 1.16   < > 0.61  CALCIUM 6.2*   < > 5.4*  MG 2.4   < > 1.9  AST 18  --   --   ALT 14*  --   --   ALKPHOS 111  --   --   BILITOT 0.8  --   --    < > = values in this interval not displayed.   RADIOLOGY:  No results found. ASSESSMENT AND PLAN:  80 M with metastatic prostate cancer admitted with dyspnea and hematochezia. Had progressive blood loss anemia.   * Acute respiratory failure secondary to COPD exacerbation:  - elective intubation for EGD on 11/17 - extubated on 11/20. On n.c.  * Acute blood loss anemia secondary to GI bleed secondary to bleeding peptic ulcer.  Status post mesenteric angiogram, embolization. Hb stable - EGD showing One cratered duodenal ulcer with a visible vessel was found in the duodenal bulb s/p embolization - Stopped anticoagulation due to GI bleed, patient received FFP, vitamin K,  3 packed RBC transfusion.   * Sepsis due to UTI: ruled out, stopped all Abx ( After 3 days)  * Metastatic prostate cancer: Patient has diffuse skeletal metastases.  Getting treatment  at [facility].   * Atrial fibrillation with RVR: On amiodarone drip.  may switch to oral metoprolol once alert.  * Acute metabolic encephalopathy   Check Ammonia, UA, CT head.   As per his daughters, in past he had prolonged awakening after critical illness.   Did not improve much in last 3-4 days, so family decided about comfort care now.  * Hypocalcemia and Hypophosphatemia   Check PTH.  * hypothyroidism   Check Free T3, Free T4- not significant.  Appreciate Palliative care input - reading records - family leaning towards comfort care- wife is waiting for her son to arrive to start  comfort care.  All the records are reviewed and case discussed with Care Management/Social Worker. Management plans discussed with the patient, family, PCCM and they are in agreement.  CODE STATUS: DNR  TOTAL TIME TAKING CARE OF THIS PATIENT: 35 minutes.   More than 50% of the time was spent in counseling/coordination of care: YES  POSSIBLE D/C IN 2-3 DAYS, DEPENDING ON CLINICAL CONDITION.   Vaughan Basta M.D on 01/26/2017 at 2:07 PM  Between 7am to 6pm - Pager - [phone]  After 6pm go to [email or web address] - Proofreader  [facility]  Office  [phone]  CC: Primary care physician; [facility]  Note: This dictation was prepared with Dragon dictation along with smaller phrase technology. Any transcriptional errors that result from this process are unintentional.

## 2017-01-26 NOTE — Progress Notes (Signed)
PULMONARY / CRITICAL CARE MEDICINE   Name: Chad Kirby MRN: [id number] DOB: 1929/09/08    ADMISSION DATE:  01/12/2017  PT PROFILE:   24 M with metastatic prostate cancer treated at [facility].  Admitted to SDU 01/09/2017 with dyspnea and hematochezia.  Initially treated as a severe sepsis with unclear source.  Had progressive blood loss anemia.  Underwent intubation for EGD 11/17.   MAJOR EVENTS/TEST RESULTS: 11/16 admitted as above with dyspnea and hematochezia 11/17 elective intubation for EGD 11/17 EGD: One cratered duodenal ulcer with a visible vessel was found in the duodenal bulb. The lesion was 3 mm in largest dimension. For hemostasis,   four hemostatic clips were successfully placed. Area was successfully injected with of a 1:10,000 solution of epinephrine for hemostasis. Coagulation for hemostasis using bipolar probe was successful. IR consulted for embolization, as large cratered ulcer with deep large visible vessel is at high risk of rebleeding 11/17 IR procedure: Mesenteric arteriogram, GDA embolization 11/18 KUB: diffuse distention of the stomach and small bowel, and air also noted within the colon 11/19 Requiring PEEP 8 and FiO2 45%. No weaning of vent. Weaned off of vasopressors. Myoclonic-like activity resolved with discontinuation of fentanyl infusion and transition to propofol.  11/20 passed SBT.  All sedation discontinued.  Took extended time to "wake up".  Extubated.  Remains encephalopathic post extubation but appears to be tolerating. 11/21 remains encephalopathic and agitated.  Remains on dexmedetomidine infusion.  Palliative care consultation requested.  Patient desires full DNR with hopes of getting him home with hospice. 11/22.  No significant change in condition.  Patient continues to be encephalopathic.  Patient's family reports that he opens eyes and answers questions and moves all extremities but does not recognize them. 11/24. Patient more lucid this morning. Family  at bedside have decided to pursue comfort care measures.  INDWELLING DEVICES:: ETT 11/17 >> 11/20 L IJ CVL 11/17 >>   MICRO DATA: MRSA PCR 11/16 >> NEG Blood 11/16 >> NEG   ANTIMICROBIALS:  Vanc 11/16 >> 11/19 Pip-tazo 11/16 >> 11/19    SUBJECTIVE:  RASS 0.  Moaning and crying out.  Not F/C consistently  VITAL SIGNS: BP 101/63   Pulse 95   Temp (!) 97.5 F (36.4 C)   Resp 19   Ht 6\' 1"  (1.854 m)   Wt 242 lb 15.2 oz (110.2 kg)   SpO2 95%   BMI 32.05 kg/m    INTAKE / OUTPUT: I/O last 3 completed shifts: In: 4259.1 [I.V.:3539.1; IV Piggyback:720] Out: 3165 [Urine:3165]  PHYSICAL EXAMINATION: General: No respiratory distress, opens eyes Neuro: PERRL, EOMI, moves all extremities HEENT: NCAT, alopecia Cardiovascular: IRIR, mildly tachy, no M noted Lungs: clear anteriorly Abdomen: Soft, nontender, no organomegaly, bowel sounds present Ext: Symmetric pretibial edema  LABS:  BMET Recent Labs  Lab 01/24/17 0440 01/25/17 1111 01/26/17 0412  NA 137 136 134*  K 3.6 3.4* 3.8  CL 109 107 105  CO2 24 24 23   BUN 28* 17 15  CREATININE 0.72 0.63 0.61  GLUCOSE 165* 148* 141*    Electrolytes Recent Labs  Lab 01/24/17 0440 01/24/17 1015 01/25/17 1111 01/26/17 0412  CALCIUM 5.6*  --  5.4* 5.4*  MG  --  2.0 1.8 1.9  PHOS  --  1.2* 1.5* 1.6*    CBC Recent Labs  Lab 01/22/17 1245 01/23/17 0451 01/24/17 0440  WBC 15.5* 8.1 7.5  HGB 12.5* 10.9* 11.1*  HCT 38.4* 33.3* 33.1*  PLT 93* 65* 58*    Coag's  Recent Labs  Lab 01/08/2017 2043  APTT 26  INR 1.15    Sepsis Markers Recent Labs  Lab 01/06/2017 1837 01/20/17 0421 01/20/17 1024 01/21/17 0524  LATICACIDVEN 2.1*  --  2.5* 1.2  PROCALCITON  --  0.39  --   --     ABG Recent Labs  Lab 01/05/2017 1546 01/13/2017 2033 01/20/17 0302  PHART  --  7.12* 7.31*  PCO2ART 63* 62* 37  PO2ART 65* 58* 66*    Liver Enzymes Recent Labs  Lab 01/21/17 0524 01/22/17 0430 01/25/17 1111  AST 16 18  --    ALT 13* 14*  --   ALKPHOS 98 111  --   BILITOT 0.8 0.8  --   ALBUMIN 2.3* 2.3* 2.0*    Cardiac Enzymes No results for input(s): TROPONINI, PROBNP in the last 168 hours.  Glucose Recent Labs  Lab 01/24/17 1833 01/24/17 2348 01/25/17 0735 01/25/17 1611 01/25/17 2358 01/26/17 0710  GLUCAP 116* 138* 139* 151* 146* 129*    CXR: NSC bilateral pleural effusions   ASSESSMENT / PLAN: Acute hypoxemic respiratory failure Chronic AF, rate adequately controlled Hemorrhagic shock -resolved Acute kidney injury Duodenal ulcer - S/P EGD, S/P embolization Acute encephalopathy/agitated delirium  PLAN Family have decided to pursue comfort care measures. Consider transfer out from ICU. Will be available as needed.  Thanks for allowing [place] to participate in the care of your patient.  Dimas Chyle MD PCCM

## 2017-01-26 NOTE — Progress Notes (Signed)
Patient continue on precedex at 1.4 mcg. Has been less agitated this shift, but has a productive cough. RT suctioned and was able to remove copious amounts of secretions. No indications of pain or discomfort. Right leg weeping more than left, dressing changed. Family at bedside.

## 2017-01-26 NOTE — Progress Notes (Signed)
[place] for electrolyte management   Pharmacy consulted for electrolyte management for 83 yo male critical care patient admitted with multiple co morbidities. Patient's corrected calcium is 6.6.   Plan:  Will order potassium phosphate 6mmol IV x 1. Will order Magnesium 2 g IV x 1. Will order calcium gluconate 2 g IV x1. Will recheck electrolytes with am labs. Goal potassium > 4, goal magnesium > 2, goal corrected calcium > 8.   Allergies  Allergen Reactions  . Fentanyl     Myoclonus  . Latex Rash    Patient Measurements: Height: 6\' 1"  (185.4 cm) Weight: 242 lb 15.2 oz (110.2 kg) IBW/kg (Calculated) : 79.9  Vital Signs: Temp: 98.5 F (36.9 C) (11/24 0200) Temp Source: Axillary (11/24 0200) BP: 97/64 (11/24 0600) Pulse Rate: 79 (11/24 0600) Intake/Output from previous day: 11/23 0701 - 11/24 0700 In: 2889.5 [I.V.:2169.5; IV Piggyback:720] Out: 2035 [Urine:1465] Intake/Output from this shift: No intake/output data recorded.  Labs: Recent Labs    01/24/17 0440 01/24/17 1015 01/25/17 1111 01/26/17 0412  WBC 7.5  --   --   --   HGB 11.1*  --   --   --   HCT 33.1*  --   --   --   PLT 58*  --   --   --   CREATININE 0.72  --  0.63 0.61  MG  --  2.0 1.8 1.9  PHOS  --  1.2* 1.5* 1.6*  ALBUMIN  --   --  2.0*  --    Estimated Creatinine Clearance: 87.8 mL/min (by C-G formula based on SCr of 0.61 mg/dL).   Medical History: Past Medical History:  Diagnosis Date  . Atrial fibrillation ([place])   . Cancer [facility])    Prostate with bone mets-current chemo treatment  . COPD (chronic obstructive pulmonary disease) ([place])   . Gastric ulcer   . Hyperlipidemia     Pharmacy will continue to monitor and adjust per consult.  Kiegan Macaraeg D 01/26/2017,8:12 AM

## 2017-01-26 NOTE — Progress Notes (Signed)
Dr. Lyndel Safe had conversation with pt and family and decision was made to change to comfort care only.  Morphine gtt started per order and family request; other IV meds discontinued.   Denied need for chaplin at present. Pt remains on 02. Family at [facility].  BA

## 2017-01-28 ENCOUNTER — Telehealth: Payer: Self-pay | Admitting: Pulmonary Disease

## 2017-01-28 NOTE — Telephone Encounter (Signed)
Recieved Death Certificate from [place], placed on [place], [place] desk

## 2017-01-28 NOTE — Telephone Encounter (Signed)
Placed in DS's folder.

## 2017-01-29 NOTE — Telephone Encounter (Signed)
Informed Rich and Leamon Arnt deth cert ready for pick up.

## 2017-02-02 NOTE — Progress Notes (Signed)
I was called by nurse from ICU this morning about the death of the pt. He was on comfort measures. I have requested the nurse to inform ICU physician also as he was still in ICU bed.

## 2017-02-02 NOTE — Progress Notes (Signed)
One daughter and son at [facility] having stayed all night with pt.  Pt con'ts to have about six respirations/min.  No obvious signs of distress.  Discussed with family next step of dying process.  Left room for a few minutes and was called back into room by family saying "he's gone".  Two nurses verified no pulse @7 :57; monitor showed asystole. Other family members notified as well as attending MD's.  Bubba Camp, RN

## 2017-02-02 NOTE — Progress Notes (Signed)
Patient remains unchanged. Morphine drip was increased earlier to 10mg /hr. Giving PRNs as ordered. Family remains at bedside.

## 2017-02-02 NOTE — Discharge Summary (Signed)
DEATH SUMMARY  DATE OF ADMISSION:  01/23/17  DATE OF DISCHARGE/DEATH:  02-01-17  ADMISSION DIAGNOSES:   Shock - concern for possible sepsis GI bleeding HCAP Atrial fibrillation, chronic Metastatic prostate cancer  DISCHARGE DIAGNOSES:   Acute hypoxemic respiratory failure - intubated for EGD Pulmonary edema/pleural effusions due to volume resuscitation Chronic AF, rate adequately controlled Hemorrhagic shock - resolved Mild hypernatremia Duodenal ulcer - S/P EGD, S/P embolization of GDA Ileus pattern on KUB - improved Acute blood loss anemia -active bleeding Thrombocytopenia Metastatic prostate cancer  Stress induced hyperglycemia, mild   Acute encephalopathy/agitated delirium   PRESENTATION:   Pt was admitted with the following HPI and the above admission diagnoses:  The patient with past medical history of prostate cancer, COPD and atrial fibrillation presents to the emergency department via EMS after an episode of shortness of breath.  The patient was discharged late yesterday evening from [facility] following the chemotherapy for his prostate cancer.  He returned home but progressively became more short of breath.  He woke his wife at night due to his coughing.  They called EMS who found him to have had a grossly bloody stool in bed.  Patient continued to have episodes of severe cough in transport and eventually required BiPAP in the emergency department.  Chest x-ray showed possible pneumonia which prompted the initiation of sepsis protocol prior to the emergency department staff calling the hospitalist service for admission.  HOSPITAL COURSE:   2023/01/24 admitted as above with dyspnea and hematochezia. Gastroenterology consultation performed 2023/01/24 CTAP:  No acute abdominopelvic abnormality.  Small bilateral pleural effusions and moderate cardiomegaly. Severe, extensive sclerotic metastatic disease of the pelvis, ribs and thoracolumbar spine, progressed relative  to 05/04/2015. No pathologic fracture. 24-Jan-2023 CTA chest: No evidence of pulmonary embolus. Trace bilateral pleural effusions 11/17 electively intubated for EGD 11/17 EGD: One cratered duodenal ulcer with a visible vessel was found in the duodenal bulb. The lesion was 3 mm in largest dimension. For hemostasis,  four hemostatic clips were successfully placed. Area was successfully injected with of a 1:10,000 solution of epinephrine for hemostasis. Coagulation for hemostasis using bipolar probe was successful. IR consulted for embolization, as large cratered ulcer with deep large visible vessel is at high risk of rebleeding 11/17 IR procedure: Mesenteric arteriogram, GDA embolization 11/18 KUB: diffuse distention of the stomach and small bowel, and air also noted within the colon 11/19 Requiring PEEP 8 and FiO2 45%. No weaning of vent. Weaned off of vasopressors. Myoclonic-like activity resolved with discontinuation of fentanyl infusion and transition to propofol.  11/20 passed SBT.  All sedation discontinued.  Took extended time to "wake up".  Extubated.  Remained encephalopathic post extubation but appeared to be tolerating extubation well. Dexmedetomidine initiated. Cardiology consultation performed for atrial fibrillation 11/21 remained encephalopathic and agitated.  Remained on dexmedetomidine infusion.  Palliative care consultation. Code status changed to full DNR with hopes of getting him home with hospice. 11/22 - 11/24 continued severe agitated delirium despite dexmedetomidine 11/22 CT head: Chronic stable superficial atrophy with moderate small vessel ischemic disease of periventricular and subcortical white matter 11/24 Family desired to pursue full comfort care measures. Morphine infusion initiated.  02/02/23 AM pt passed away peacefully   Merton Border, MD PCCM service Mobile [phone] Pager [phone] 02/01/2017 12:02 PM

## 2017-02-02 DEATH — deceased
